# Patient Record
Sex: Male | Born: 1960 | Race: White | Hispanic: No | Smoking: Never smoker
Health system: Southern US, Community
[De-identification: ages and names within clinical notes are randomized; demographics above are authoritative.]

## PROBLEM LIST (undated history)

## (undated) DIAGNOSIS — M5030 Other cervical disc degeneration, unspecified cervical region: Secondary | ICD-10-CM

## (undated) DIAGNOSIS — M51369 Other intervertebral disc degeneration, lumbar region without mention of lumbar back pain or lower extremity pain: Secondary | ICD-10-CM

## (undated) DIAGNOSIS — M545 Low back pain, unspecified: Secondary | ICD-10-CM

## (undated) DIAGNOSIS — F329 Major depressive disorder, single episode, unspecified: Secondary | ICD-10-CM

## (undated) DIAGNOSIS — F32A Depression, unspecified: Secondary | ICD-10-CM

## (undated) DIAGNOSIS — M8580 Other specified disorders of bone density and structure, unspecified site: Secondary | ICD-10-CM

## (undated) DIAGNOSIS — C801 Malignant (primary) neoplasm, unspecified: Secondary | ICD-10-CM

## (undated) DIAGNOSIS — K219 Gastro-esophageal reflux disease without esophagitis: Secondary | ICD-10-CM

## (undated) DIAGNOSIS — M1990 Unspecified osteoarthritis, unspecified site: Secondary | ICD-10-CM

## (undated) DIAGNOSIS — K635 Polyp of colon: Secondary | ICD-10-CM

## (undated) DIAGNOSIS — M5136 Other intervertebral disc degeneration, lumbar region: Secondary | ICD-10-CM

## (undated) HISTORY — DX: Other intervertebral disc degeneration, lumbar region without mention of lumbar back pain or lower extremity pain: M51.369

## (undated) HISTORY — DX: Other specified disorders of bone density and structure, unspecified site: M85.80

## (undated) HISTORY — DX: Other intervertebral disc degeneration, lumbar region: M51.36

## (undated) HISTORY — PX: HIP SURGERY: SHX245

## (undated) HISTORY — DX: Depression, unspecified: F32.A

## (undated) HISTORY — DX: Unspecified osteoarthritis, unspecified site: M19.90

## (undated) HISTORY — DX: Gastro-esophageal reflux disease without esophagitis: K21.9

## (undated) HISTORY — PX: GYNECOMASTIA EXCISION: SHX5170

## (undated) HISTORY — DX: Major depressive disorder, single episode, unspecified: F32.9

## (undated) HISTORY — DX: Malignant (primary) neoplasm, unspecified: C80.1

## (undated) HISTORY — DX: Other cervical disc degeneration, unspecified cervical region: M50.30

## (undated) HISTORY — DX: Polyp of colon: K63.5

## (undated) HISTORY — DX: Low back pain: M54.5

## (undated) HISTORY — PX: CYSTECTOMY: SHX5119

## (undated) HISTORY — DX: Low back pain, unspecified: M54.50

---

## 1998-01-31 ENCOUNTER — Ambulatory Visit (HOSPITAL_BASED_OUTPATIENT_CLINIC_OR_DEPARTMENT_OTHER): Admission: RE | Admit: 1998-01-31 | Discharge: 1998-01-31 | Payer: Self-pay | Admitting: Specialist

## 2004-02-14 ENCOUNTER — Emergency Department (HOSPITAL_COMMUNITY): Admission: EM | Admit: 2004-02-14 | Discharge: 2004-02-14 | Payer: Self-pay | Admitting: Emergency Medicine

## 2004-02-21 ENCOUNTER — Ambulatory Visit: Payer: Self-pay | Admitting: Family Medicine

## 2004-03-02 ENCOUNTER — Ambulatory Visit: Payer: Self-pay | Admitting: Family Medicine

## 2004-03-09 ENCOUNTER — Ambulatory Visit: Payer: Self-pay

## 2004-03-14 ENCOUNTER — Ambulatory Visit: Payer: Self-pay | Admitting: Family Medicine

## 2004-06-06 ENCOUNTER — Ambulatory Visit: Payer: Self-pay | Admitting: Family Medicine

## 2004-06-20 ENCOUNTER — Ambulatory Visit: Payer: Self-pay | Admitting: Family Medicine

## 2004-06-30 ENCOUNTER — Ambulatory Visit: Payer: Self-pay | Admitting: Family Medicine

## 2005-01-05 ENCOUNTER — Ambulatory Visit: Payer: Self-pay | Admitting: Family Medicine

## 2005-05-23 ENCOUNTER — Ambulatory Visit: Payer: Self-pay | Admitting: Family Medicine

## 2005-11-30 ENCOUNTER — Ambulatory Visit: Payer: Self-pay | Admitting: Family Medicine

## 2005-12-11 ENCOUNTER — Ambulatory Visit: Payer: Self-pay | Admitting: Family Medicine

## 2007-02-13 ENCOUNTER — Ambulatory Visit: Payer: Self-pay | Admitting: Family Medicine

## 2007-02-13 DIAGNOSIS — Z860100 Personal history of colon polyps, unspecified: Secondary | ICD-10-CM | POA: Insufficient documentation

## 2007-02-13 DIAGNOSIS — IMO0002 Reserved for concepts with insufficient information to code with codable children: Secondary | ICD-10-CM | POA: Insufficient documentation

## 2007-02-13 DIAGNOSIS — M1990 Unspecified osteoarthritis, unspecified site: Secondary | ICD-10-CM | POA: Insufficient documentation

## 2007-02-13 DIAGNOSIS — F32A Depression, unspecified: Secondary | ICD-10-CM | POA: Insufficient documentation

## 2007-02-13 DIAGNOSIS — F329 Major depressive disorder, single episode, unspecified: Secondary | ICD-10-CM

## 2007-02-13 DIAGNOSIS — K219 Gastro-esophageal reflux disease without esophagitis: Secondary | ICD-10-CM | POA: Insufficient documentation

## 2007-02-24 ENCOUNTER — Telehealth: Payer: Self-pay | Admitting: Family Medicine

## 2007-03-20 ENCOUNTER — Encounter: Payer: Self-pay | Admitting: Family Medicine

## 2007-04-06 HISTORY — PX: LAMINECTOMY: SHX219

## 2007-04-23 ENCOUNTER — Ambulatory Visit (HOSPITAL_COMMUNITY): Admission: RE | Admit: 2007-04-23 | Discharge: 2007-04-23 | Payer: Self-pay | Admitting: Anesthesiology

## 2007-05-06 ENCOUNTER — Encounter: Payer: Self-pay | Admitting: Family Medicine

## 2007-05-07 ENCOUNTER — Encounter: Payer: Self-pay | Admitting: Family Medicine

## 2007-05-07 ENCOUNTER — Encounter: Admission: RE | Admit: 2007-05-07 | Discharge: 2007-05-07 | Payer: Self-pay | Admitting: Neurological Surgery

## 2007-05-09 ENCOUNTER — Ambulatory Visit: Payer: Self-pay | Admitting: Family Medicine

## 2007-05-12 LAB — CONVERTED CEMR LAB
ALT: 36 units/L (ref 0–53)
AST: 27 units/L (ref 0–37)
Albumin: 3.9 g/dL (ref 3.5–5.2)
Alkaline Phosphatase: 92 units/L (ref 39–117)
BUN: 13 mg/dL (ref 6–23)
Basophils Absolute: 0 10*3/uL (ref 0.0–0.1)
Basophils Relative: 0.7 % (ref 0.0–1.0)
Bilirubin, Direct: 0.2 mg/dL (ref 0.0–0.3)
CO2: 28 meq/L (ref 19–32)
Calcium: 9.3 mg/dL (ref 8.4–10.5)
Chloride: 106 meq/L (ref 96–112)
Cholesterol: 227 mg/dL (ref 0–200)
Creatinine, Ser: 0.9 mg/dL (ref 0.4–1.5)
Direct LDL: 175.2 mg/dL
Eosinophils Absolute: 0.1 10*3/uL (ref 0.0–0.6)
Eosinophils Relative: 1.3 % (ref 0.0–5.0)
GFR calc Af Amer: 117 mL/min
GFR calc non Af Amer: 97 mL/min
Glucose, Bld: 97 mg/dL (ref 70–99)
HCT: 46.1 % (ref 39.0–52.0)
HDL: 25.3 mg/dL — ABNORMAL LOW (ref >39.0)
Hemoglobin: 15.5 g/dL (ref 13.0–17.0)
Lymphocytes Relative: 19.1 % (ref 12.0–46.0)
MCHC: 33.6 g/dL (ref 30.0–36.0)
MCV: 88 fL (ref 78.0–100.0)
Monocytes Absolute: 0.6 10*3/uL (ref 0.2–0.7)
Monocytes Relative: 11.8 % — ABNORMAL HIGH (ref 3.0–11.0)
Neutro Abs: 3.7 10*3/uL (ref 1.4–7.7)
Neutrophils Relative %: 67.1 % (ref 43.0–77.0)
Platelets: 278 10*3/uL (ref 150–400)
Potassium: 4.2 meq/L (ref 3.5–5.1)
RBC: 5.24 M/uL (ref 4.22–5.81)
RDW: 13.1 % (ref 11.5–14.6)
Sodium: 141 meq/L (ref 135–145)
TSH: 1.33 microintl units/mL (ref 0.35–5.50)
Testosterone: 217.02 ng/dL — ABNORMAL LOW (ref 350.00–890)
Total Bilirubin: 1.3 mg/dL — ABNORMAL HIGH (ref 0.3–1.2)
Total CHOL/HDL Ratio: 9
Total Protein: 6.7 g/dL (ref 6.0–8.3)
Triglycerides: 121 mg/dL (ref 0–149)
VLDL: 24 mg/dL (ref 0–40)
WBC: 5.4 10*3/uL (ref 4.5–10.5)

## 2007-05-16 ENCOUNTER — Ambulatory Visit: Payer: Self-pay | Admitting: Family Medicine

## 2007-05-16 DIAGNOSIS — E291 Testicular hypofunction: Secondary | ICD-10-CM | POA: Insufficient documentation

## 2007-05-16 DIAGNOSIS — E785 Hyperlipidemia, unspecified: Secondary | ICD-10-CM | POA: Insufficient documentation

## 2007-05-16 DIAGNOSIS — M810 Age-related osteoporosis without current pathological fracture: Secondary | ICD-10-CM | POA: Insufficient documentation

## 2007-05-26 ENCOUNTER — Telehealth: Payer: Self-pay | Admitting: Family Medicine

## 2007-06-03 ENCOUNTER — Telehealth: Payer: Self-pay | Admitting: Family Medicine

## 2007-08-06 ENCOUNTER — Telehealth: Payer: Self-pay | Admitting: Family Medicine

## 2007-08-15 ENCOUNTER — Ambulatory Visit: Payer: Self-pay | Admitting: Family Medicine

## 2007-08-19 LAB — CONVERTED CEMR LAB
Cholesterol: 213 mg/dL (ref 0–200)
Direct LDL: 163.4 mg/dL
HDL: 27.3 mg/dL — ABNORMAL LOW (ref >39.0)
Total CHOL/HDL Ratio: 7.8
Triglycerides: 125 mg/dL (ref 0–149)
VLDL: 25 mg/dL (ref 0–40)

## 2007-10-01 ENCOUNTER — Ambulatory Visit: Payer: Self-pay | Admitting: Family Medicine

## 2007-10-01 LAB — CONVERTED CEMR LAB
Bilirubin Urine: NEGATIVE
Blood in Urine, dipstick: NEGATIVE
Glucose, Urine, Semiquant: NEGATIVE
Ketones, urine, test strip: NEGATIVE
Nitrite: NEGATIVE
Protein, U semiquant: NEGATIVE
Specific Gravity, Urine: 1.02
Urobilinogen, UA: 0.2
WBC Urine, dipstick: NEGATIVE
pH: 5.5

## 2007-10-06 LAB — CONVERTED CEMR LAB
ALT: 46 units/L (ref 0–53)
AST: 30 units/L (ref 0–37)
Albumin: 4.2 g/dL (ref 3.5–5.2)
Alkaline Phosphatase: 99 units/L (ref 39–117)
BUN: 12 mg/dL (ref 6–23)
Basophils Absolute: 0 10*3/uL (ref 0.0–0.1)
Basophils Relative: 0.1 % (ref 0.0–3.0)
Bilirubin, Direct: 0.1 mg/dL (ref 0.0–0.3)
CO2: 30 meq/L (ref 19–32)
Calcium: 9.5 mg/dL (ref 8.4–10.5)
Chloride: 103 meq/L (ref 96–112)
Cholesterol: 213 mg/dL (ref 0–200)
Creatinine, Ser: 1 mg/dL (ref 0.4–1.5)
Direct LDL: 158.8 mg/dL
Eosinophils Absolute: 0.1 10*3/uL (ref 0.0–0.7)
Eosinophils Relative: 1.2 % (ref 0.0–5.0)
GFR calc Af Amer: 104 mL/min
GFR calc non Af Amer: 86 mL/min
Glucose, Bld: 93 mg/dL (ref 70–99)
HCT: 48.7 % (ref 39.0–52.0)
HDL: 27 mg/dL — ABNORMAL LOW (ref >39.0)
Hemoglobin: 16.9 g/dL (ref 13.0–17.0)
Lymphocytes Relative: 19.3 % (ref 12.0–46.0)
MCHC: 34.7 g/dL (ref 30.0–36.0)
MCV: 86.1 fL (ref 78.0–100.0)
Monocytes Absolute: 0.7 10*3/uL (ref 0.1–1.0)
Monocytes Relative: 12 % (ref 3.0–12.0)
Neutro Abs: 3.7 10*3/uL (ref 1.4–7.7)
Neutrophils Relative %: 67.4 % (ref 43.0–77.0)
Platelets: 223 10*3/uL (ref 150–400)
Potassium: 4.3 meq/L (ref 3.5–5.1)
RBC: 5.65 M/uL (ref 4.22–5.81)
RDW: 12.9 % (ref 11.5–14.6)
Sodium: 141 meq/L (ref 135–145)
TSH: 1.34 microintl units/mL (ref 0.35–5.50)
Testosterone: 331.89 ng/dL — ABNORMAL LOW (ref 350.00–890)
Total Bilirubin: 1 mg/dL (ref 0.3–1.2)
Total CHOL/HDL Ratio: 7.9
Total Protein: 7.7 g/dL (ref 6.0–8.3)
Triglycerides: 137 mg/dL (ref 0–149)
VLDL: 27 mg/dL (ref 0–40)
WBC: 5.6 10*3/uL (ref 4.5–10.5)

## 2007-10-07 ENCOUNTER — Ambulatory Visit: Payer: Self-pay | Admitting: Family Medicine

## 2007-10-07 DIAGNOSIS — M949 Disorder of cartilage, unspecified: Secondary | ICD-10-CM

## 2007-10-07 DIAGNOSIS — M899 Disorder of bone, unspecified: Secondary | ICD-10-CM | POA: Insufficient documentation

## 2008-01-21 ENCOUNTER — Ambulatory Visit: Payer: Self-pay | Admitting: Family Medicine

## 2008-01-21 DIAGNOSIS — N644 Mastodynia: Secondary | ICD-10-CM | POA: Insufficient documentation

## 2008-02-02 ENCOUNTER — Telehealth: Payer: Self-pay | Admitting: Family Medicine

## 2008-02-02 ENCOUNTER — Encounter: Payer: Self-pay | Admitting: Family Medicine

## 2008-02-17 ENCOUNTER — Telehealth: Payer: Self-pay | Admitting: Family Medicine

## 2008-02-18 ENCOUNTER — Ambulatory Visit: Payer: Self-pay | Admitting: Family Medicine

## 2008-03-01 ENCOUNTER — Encounter: Admission: RE | Admit: 2008-03-01 | Discharge: 2008-03-01 | Payer: Self-pay | Admitting: Orthopedic Surgery

## 2008-03-05 HISTORY — PX: COLONOSCOPY: SHX174

## 2008-06-08 ENCOUNTER — Encounter: Admission: RE | Admit: 2008-06-08 | Discharge: 2008-06-08 | Payer: Self-pay | Admitting: Orthopedic Surgery

## 2008-06-21 ENCOUNTER — Inpatient Hospital Stay (HOSPITAL_COMMUNITY): Admission: RE | Admit: 2008-06-21 | Discharge: 2008-06-24 | Payer: Self-pay | Admitting: Orthopedic Surgery

## 2008-07-27 ENCOUNTER — Telehealth: Payer: Self-pay | Admitting: Family Medicine

## 2008-07-29 ENCOUNTER — Ambulatory Visit: Payer: Self-pay | Admitting: Family Medicine

## 2008-08-04 ENCOUNTER — Encounter: Admission: RE | Admit: 2008-08-04 | Discharge: 2008-08-04 | Payer: Self-pay | Admitting: Orthopedic Surgery

## 2008-08-05 LAB — CONVERTED CEMR LAB
Cholesterol: 171 mg/dL (ref 0–200)
HDL: 30.1 mg/dL — ABNORMAL LOW (ref >39.00)
LDL Cholesterol: 116 mg/dL — ABNORMAL HIGH (ref 0–99)
Total CHOL/HDL Ratio: 6
Triglycerides: 127 mg/dL (ref 0.0–149.0)
VLDL: 25.4 mg/dL (ref 0.0–40.0)

## 2008-09-22 ENCOUNTER — Encounter (INDEPENDENT_AMBULATORY_CARE_PROVIDER_SITE_OTHER): Payer: Self-pay | Admitting: *Deleted

## 2008-10-15 HISTORY — PX: LUMBAR MICRODISCECTOMY: SHX99

## 2008-11-23 ENCOUNTER — Encounter (INDEPENDENT_AMBULATORY_CARE_PROVIDER_SITE_OTHER): Payer: Self-pay | Admitting: *Deleted

## 2009-01-10 ENCOUNTER — Ambulatory Visit: Payer: Self-pay | Admitting: Family Medicine

## 2009-01-11 DIAGNOSIS — M545 Low back pain, unspecified: Secondary | ICD-10-CM | POA: Insufficient documentation

## 2009-01-12 ENCOUNTER — Ambulatory Visit: Payer: Self-pay | Admitting: Family Medicine

## 2009-01-12 LAB — CONVERTED CEMR LAB
Bilirubin Urine: NEGATIVE
Blood in Urine, dipstick: NEGATIVE
Glucose, Urine, Semiquant: NEGATIVE
Ketones, urine, test strip: NEGATIVE
Nitrite: NEGATIVE
Protein, U semiquant: NEGATIVE
Specific Gravity, Urine: 1.015
Urobilinogen, UA: 0.2
WBC Urine, dipstick: NEGATIVE

## 2009-01-14 LAB — CONVERTED CEMR LAB
ALT: 46 units/L (ref 0–53)
AST: 30 units/L (ref 0–37)
Albumin: 4 g/dL (ref 3.5–5.2)
Alkaline Phosphatase: 102 units/L (ref 39–117)
BUN: 12 mg/dL (ref 6–23)
Basophils Absolute: 0 10*3/uL (ref 0.0–0.1)
Basophils Relative: 0.2 % (ref 0.0–3.0)
Bilirubin, Direct: 0 mg/dL (ref 0.0–0.3)
CO2: 29 meq/L (ref 19–32)
Calcium: 8.9 mg/dL (ref 8.4–10.5)
Chloride: 104 meq/L (ref 96–112)
Cholesterol: 220 mg/dL — ABNORMAL HIGH (ref 0–200)
Creatinine, Ser: 0.9 mg/dL (ref 0.4–1.5)
Direct LDL: 166.6 mg/dL
Eosinophils Absolute: 0.1 10*3/uL (ref 0.0–0.7)
Eosinophils Relative: 2.3 % (ref 0.0–5.0)
GFR calc non Af Amer: 96.24 mL/min (ref >60)
Glucose, Bld: 90 mg/dL (ref 70–99)
HCT: 45.6 % (ref 39.0–52.0)
HDL: 30 mg/dL — ABNORMAL LOW (ref >39.00)
Hemoglobin: 15.7 g/dL (ref 13.0–17.0)
Lymphocytes Relative: 24.5 % (ref 12.0–46.0)
Lymphs Abs: 1.1 10*3/uL (ref 0.7–4.0)
MCHC: 34.4 g/dL (ref 30.0–36.0)
MCV: 88.8 fL (ref 78.0–100.0)
Monocytes Absolute: 0.5 10*3/uL (ref 0.1–1.0)
Monocytes Relative: 11.8 % (ref 3.0–12.0)
Neutro Abs: 2.9 10*3/uL (ref 1.4–7.7)
Neutrophils Relative %: 61.2 % (ref 43.0–77.0)
Platelets: 223 10*3/uL (ref 150.0–400.0)
Potassium: 4.3 meq/L (ref 3.5–5.1)
RBC: 5.13 M/uL (ref 4.22–5.81)
RDW: 13.2 % (ref 11.5–14.6)
Sodium: 141 meq/L (ref 135–145)
TSH: 1.43 microintl units/mL (ref 0.35–5.50)
Total Bilirubin: 0.8 mg/dL (ref 0.3–1.2)
Total CHOL/HDL Ratio: 7
Total Protein: 6.9 g/dL (ref 6.0–8.3)
Triglycerides: 133 mg/dL (ref 0.0–149.0)
VLDL: 26.6 mg/dL (ref 0.0–40.0)
WBC: 4.6 10*3/uL (ref 4.5–10.5)

## 2009-01-17 ENCOUNTER — Ambulatory Visit: Payer: Self-pay | Admitting: Family Medicine

## 2009-01-25 ENCOUNTER — Telehealth: Payer: Self-pay | Admitting: Family Medicine

## 2009-08-17 ENCOUNTER — Ambulatory Visit: Payer: Self-pay | Admitting: Family Medicine

## 2009-08-18 ENCOUNTER — Telehealth: Payer: Self-pay | Admitting: Family Medicine

## 2009-08-18 LAB — CONVERTED CEMR LAB
Cholesterol: 207 mg/dL — ABNORMAL HIGH (ref 0–200)
Direct LDL: 138.8 mg/dL
HDL: 29.2 mg/dL — ABNORMAL LOW (ref >39.00)
Total CHOL/HDL Ratio: 7
Triglycerides: 215 mg/dL — ABNORMAL HIGH (ref 0.0–149.0)
VLDL: 43 mg/dL — ABNORMAL HIGH (ref 0.0–40.0)

## 2009-08-24 ENCOUNTER — Ambulatory Visit: Payer: Self-pay | Admitting: Family Medicine

## 2009-08-24 DIAGNOSIS — K589 Irritable bowel syndrome without diarrhea: Secondary | ICD-10-CM | POA: Insufficient documentation

## 2009-12-27 ENCOUNTER — Encounter: Payer: Self-pay | Admitting: Family Medicine

## 2010-02-01 ENCOUNTER — Encounter: Payer: Self-pay | Admitting: Family Medicine

## 2010-03-27 ENCOUNTER — Encounter: Payer: Self-pay | Admitting: Neurological Surgery

## 2010-04-04 NOTE — Assessment & Plan Note (Signed)
Summary: CONGESTION X 3 WEEKS/CCM   Vital Signs:  Patient Profile:   50 Years Old Male Weight:      254 pounds Temp:     98.5 degrees F oral Pulse rate:   122 / minute BP sitting:   112 / 82  (left arm) Cuff size:   large  Vitals Entered By: Alfred Levins, CMA (February 13, 2007 9:58 AM)                 Chief Complaint:  st and cough.  History of Present Illness: 4 weeks of hard dry cough, ST, and PND. Some HA. No fever. On Mucinex DM and fluids. Saw Urgent Care 2 weeks ago, was given Ceftin, did not help much.  Current Allergies: No known allergies   Past Medical History:    Colonic polyps, hx of    Depression    GERD    Osteoarthritis  Past Surgical History:    colonoscopy    Benign cyst removed from rt hip     Review of Systems      See HPI   Physical Exam  General:     Well-developed,well-nourished,in no acute distress; alert,appropriate and cooperative throughout examination Head:     Normocephalic and atraumatic without obvious abnormalities. No apparent alopecia or balding. Eyes:     No corneal or conjunctival inflammation noted. EOMI. Perrla. Funduscopic exam benign, without hemorrhages, exudates or papilledema. Vision grossly normal. Ears:     External ear exam shows no significant lesions or deformities.  Otoscopic examination reveals clear canals, tympanic membranes are intact bilaterally without bulging, retraction, inflammation or discharge. Hearing is grossly normal bilaterally. Nose:     External nasal examination shows no deformity or inflammation. Nasal mucosa are pink and moist without lesions or exudates. Mouth:     Oral mucosa and oropharynx without lesions or exudates.  Teeth in good repair. Neck:     No deformities, masses, or tenderness noted. Lungs:     Normal respiratory effort, chest expands symmetrically. Lungs are clear to auscultation, no crackles or wheezes.    Impression & Recommendations:  Problem # 1:  OTHER ACUTE  SINUSITIS (ICD-461.8)  His updated medication list for this problem includes:    Augmentin 875-125 Mg Tabs (Amoxicillin-pot clavulanate) .Marland Kitchen..Marland Kitchen Two times a day    Hycodan 5-1.5 Mg/68ml Syrp (Hydrocodone-homatropine) .Marland Kitchen... 1 tsp every 4 hours as needed cough   Complete Medication List: 1)  Aleve 220 Mg Tabs (Naproxen sodium) .... As needed 2)  Augmentin 875-125 Mg Tabs (Amoxicillin-pot clavulanate) .... Two times a day 3)  Hycodan 5-1.5 Mg/58ml Syrp (Hydrocodone-homatropine) .Marland Kitchen.. 1 tsp every 4 hours as needed cough   Patient Instructions: 1)  Please schedule a follow-up appointment as needed.    Prescriptions: HYCODAN 5-1.5 MG/5ML  SYRP (HYDROCODONE-HOMATROPINE) 1 tsp every 4 hours as needed cough  #240 ml x 0   Entered and Authorized by:   Nelwyn Salisbury MD   Signed by:   Nelwyn Salisbury MD on 02/13/2007   Method used:   Print then Give to Patient   RxID:   [id number] AUGMENTIN 875-125 MG  TABS (AMOXICILLIN-POT CLAVULANATE) two times a day  #20 x 0   Entered and Authorized by:   Nelwyn Salisbury MD   Signed by:   Nelwyn Salisbury MD on 02/13/2007   Method used:   Print then Give to Patient   RxID:   [id number]  ]

## 2010-04-04 NOTE — Letter (Signed)
Summary: [facility]  [facility] Provider: This provider was preselected by the workflow.  Signature: The signature status of this document was preset by the workflow  Processed by [employer] Local Indexer Client @ Tuesday, January 18, 2009 9:35:25 AM using version:2010.1.2.11(2.4)   Manually Indexed By: [id number]  idlBatchDetail: [id number]   _____________________________________________________________________  External Attachment:    Type:   Image     Comment:   External Document

## 2010-04-04 NOTE — Assessment & Plan Note (Signed)
Summary: REVIEW LABWORK // RS   Vital Signs:  Patient profile:   50 year old male Weight:      260 pounds BMI:     37.98 BP sitting:   130 / 90  (left arm) Cuff size:   large  Vitals Entered By: Raechel Ache, RN (August 24, 2009 3:36 PM) CC: F/u labs. C/o bloating and burping.   History of Present Illness: Here to discuss his recent lipid panel resuls and also to ask some questions. he has been  closely watching his intake of fatty foods, and this is reflected in the way his LDL has come down. He is exercising as well by running or riding a bike. He asks how he can get his TG down and his HDL up. Also he describes frequent bloating in the abdomen and gasseousness. He has a lot of belching and some flatus. No heartburn. His BMs are mostly normal. he has tried to reduce his intake of starchy foods.   Allergies: No Known Drug Allergies  Past History:  Past Medical History: Reviewed history from 01/10/2009 and no changes required. Colonic polyps, hx of Depression GERD Osteoarthritis normal stress test on 03-09-04 Osteopenia, had DEXA on 05-07-07 Low back pain  Past Surgical History: Reviewed history from 01/10/2009 and no changes required. colonoscopy 2004 per a GI in [place], repeat in 5 yrs Benign cyst removed from rt hip, sees Dr. Lequita Halt Lumbar laminectomy at L5-S1 per Dr. Marikay Alar 2-09 bilateral gynecomastia reductions per Dr. 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 Homero Fellers Aluisio lumbar microdiskectomy at L4-5, on  10-15-08 per Dr. Donette Larry at [facility]  Review of Systems  The patient denies anorexia, fever, weight loss, weight gain, vision loss, decreased hearing, hoarseness, chest pain, syncope, dyspnea on exertion, peripheral edema, prolonged cough, headaches, hemoptysis, abdominal pain, melena, hematochezia, severe indigestion/heartburn, hematuria, incontinence, genital sores, muscle weakness, suspicious skin lesions, transient blindness,  difficulty walking, depression, unusual weight change, abnormal bleeding, enlarged lymph nodes, angioedema, breast masses, and testicular masses.    Physical Exam  General:  Well-developed,well-nourished,in no acute distress; alert,appropriate and cooperative throughout examination Neck:  No deformities, masses, or tenderness noted. Lungs:  Normal respiratory effort, chest expands symmetrically. Lungs are clear to auscultation, no crackles or wheezes. Heart:  Normal rate and regular rhythm. S1 and S2 normal without gallop, murmur, click, rub or other extra sounds. Abdomen:  Bowel sounds positive,abdomen soft and non-tender without masses, organomegaly or hernias noted.   Impression & Recommendations:  Problem # 1:  IRRITABLE BOWEL SYNDROME (ICD-564.1)  Problem # 2:  HYPERLIPIDEMIA (ICD-272.4)  Problem # 3:  GERD (ICD-530.81)  Problem # 4:  COLONIC POLYPS, HX OF (ICD-V12.72)  Complete Medication List: 1)  Diclofenac Sodium 75 Mg Tbec (Diclofenac sodium) .... Two times a day 2)  Gabapentin 300 Mg Caps (Gabapentin) .... Two times a day 3)  Fish Oil 1000 Mg Caps (Omega-3 fatty acids) .... 2 once daily 4)  Align Caps (Probiotic product) .... Once daily  Patient Instructions: 1)  Add Align as a probiotic daily. 2)  Add fish oil capsules daily for the lipids.  3)  Please schedule a follow-up appointment in 6 months .

## 2010-04-04 NOTE — Assessment & Plan Note (Signed)
Summary: cough and congestion/mhf   Vital Signs:  Patient Profile:   50 Years Old Male Height:     69.5 inches Weight:      268 pounds Temp:     98.1 degrees F oral Pulse rate:   78 / minute BP sitting:   122 / 80  (left arm) Cuff size:   large  Vitals Entered By: Alfred Levins, CMA (February 18, 2008 4:20 PM)                 Chief Complaint:  cough x6 wks.  History of Present Illness: For the past 6 weeks he has had dry hacky coughing which has not responded to Biaxin or Augmentin. No sinus pressure or ST or fever.     Current Allergies (reviewed today): No known allergies   Past Medical History:    Reviewed history from 10/07/2007 and no changes required:       Colonic polyps, hx of       Depression       GERD       Osteoarthritis       normal stress test on 03-09-04       Osteopenia, had DEXA on 05-07-07     Review of Systems  The patient denies anorexia, fever, weight loss, weight gain, vision loss, decreased hearing, hoarseness, chest pain, syncope, dyspnea on exertion, peripheral edema, headaches, hemoptysis, abdominal pain, melena, hematochezia, severe indigestion/heartburn, hematuria, incontinence, genital sores, muscle weakness, suspicious skin lesions, transient blindness, difficulty walking, depression, unusual weight change, abnormal bleeding, enlarged lymph nodes, angioedema, breast masses, and testicular masses.     Physical Exam  General:     Well-developed,well-nourished,in no acute distress; alert,appropriate and cooperative throughout examination Head:     Normocephalic and atraumatic without obvious abnormalities. No apparent alopecia or balding. Eyes:     No corneal or conjunctival inflammation noted. EOMI. Perrla. Funduscopic exam benign, without hemorrhages, exudates or papilledema. Vision grossly normal. Ears:     External ear exam shows no significant lesions or deformities.  Otoscopic examination reveals clear canals, tympanic membranes are  intact bilaterally without bulging, retraction, inflammation or discharge. Hearing is grossly normal bilaterally. Nose:     External nasal examination shows no deformity or inflammation. Nasal mucosa are pink and moist without lesions or exudates. Mouth:     Oral mucosa and oropharynx without lesions or exudates.  Teeth in good repair. Neck:     No deformities, masses, or tenderness noted. Lungs:     Normal respiratory effort, chest expands symmetrically. Lungs are clear to auscultation, no crackles or wheezes.    Impression & Recommendations:  Problem # 1:  ACUTE BRONCHITIS (ICD-466.0)  The following medications were removed from the medication list:    Augmentin 875-125 Mg Tabs (Amoxicillin-pot clavulanate) .Marland Kitchen... 1 by mouth two times a day  His updated medication list for this problem includes:    Avelox 400 Mg Tabs (Moxifloxacin hcl) ..... Once daily   Complete Medication List: 1)  Diclofenac Sodium 75 Mg Tbec (Diclofenac sodium) .... Two times a day 2)  Calcium Antacid 500 Mg Chew (Calcium carbonate antacid) .... Two times a day 3)  Avelox 400 Mg Tabs (Moxifloxacin hcl) .... Once daily 4)  Sterapred Ds 12 Day 10 Mg Tabs (Prednisone) .... As directed   Patient Instructions: 1)  Please schedule a follow-up appointment as needed.   Prescriptions: STERAPRED DS 12 DAY 10 MG TABS (PREDNISONE) as directed  #10 x 0   Entered and Authorized by:  Nelwyn Salisbury MD   Signed by:   Nelwyn Salisbury MD on 02/18/2008   Method used:   Electronically to        [facility] (retail)       [address]       [place], [place]  [address]       Ph: [phone]       Fax: [phone]   RxID:   [id number] AVELOX 400 MG TABS (MOXIFLOXACIN HCL) once daily  #10 x 0   Entered and Authorized by:   Nelwyn Salisbury MD   Signed by:   Nelwyn Salisbury MD on 02/18/2008   Method used:   Electronically to        [facility] (retail)       [address]       [place], [place]  [address]       Ph: [phone]        Fax: [phone]   RxID:   [id number]  ]

## 2010-04-04 NOTE — Progress Notes (Signed)
Summary: return call  Phone Note Call from Patient Call back at Home Phone [phone]   Caller: Patient---live call Summary of Call: returning a call Initial call taken by: Warnell Forester,  August 18, 2009 12:17 PM  Follow-up for Phone Call        Phone Call Completed Follow-up by: Raechel Ache, RN,  August 18, 2009 12:30 PM

## 2010-04-04 NOTE — Assessment & Plan Note (Signed)
Summary: DISCUSS LABSA AND BONE DENSITY/MHF   Vital Signs:  Patient Profile:   50 Years Old Male Weight:      251.5 pounds Temp:     98.1 degrees F BP sitting:   130 / 84  Vitals Entered By: Sindy Guadeloupe RN (May 16, 2007 4:20 PM)                 Chief Complaint:  review labs & bone density.  History of Present Illness: Here to discuss recent test results. Had labs here showing a low testosterone of 211 and a high LDL of 175. He has changed his diet a lot with less fried foods, etc. Has lost 7 lbs. in the past 2 weeks.  Had surgery recently with Dr. Yetta Barre, and it was noted that his bones were soft. Had a DEXA, but I haven't seen results yet. He is taking Vit. D and calcium. He is interested in taking testosterone supplements. 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 No doubt this has contributed to this problem.    Current Allergies (reviewed today): No known allergies   Past Medical History:    Reviewed history from 02/13/2007 and no changes required:       Colonic polyps, hx of       Depression       GERD       Osteoarthritis     Review of Systems  The patient denies anorexia, fever, weight loss, weight gain, vision loss, decreased hearing, hoarseness, chest pain, syncope, dyspnea on exhertion, peripheral edema, prolonged cough, hemoptysis, abdominal pain, melena, hematochezia, severe indigestion/heartburn, hematuria, incontinence, genital sores, muscle weakness, suspicious skin lesions, transient blindness, difficulty walking, depression, unusual weight change, abnormal bleeding, enlarged lymph nodes, angioedema, breast masses, and testicular masses.     Physical Exam  General:     Well-developed,well-nourished,in no acute distress; alert,appropriate and cooperative throughout examination    Impression & Recommendations:  Problem # 1:  HYPERLIPIDEMIA  (ICD-272.4)  Problem # 2:  OSTEOPOROSIS (ICD-733.00)  Problem # 3:  TESTOSTERONE DEFICIENCY (ICD-257.2)  Complete Medication List: 1)  Diclofenac Sodium 75 Mg Tbec (Diclofenac sodium) .... Two times a day 2)  Calcium Antacid 500 Mg Chew (Calcium carbonate antacid) .... Two times a day   Patient Instructions: 1)  We discussed a low fat diet. Will repeat labs in 3 months. We discussed osteoporosis.  Taking Vit. D 2000 mg once daily and calcium 2000mg  once daily will help. Will await DEXA results. Based on this we will discuss options for testosterone supplements.    ]

## 2010-04-04 NOTE — Letter (Signed)
Summary: [facility]  [facility]   Imported By: Maryln Gottron 02/02/2010 11:17:46  _____________________________________________________________________  External Attachment:    Type:   Image     Comment:   External Document

## 2010-04-04 NOTE — Progress Notes (Signed)
Summary: avelox rx  Phone Note Call from Patient Call back at Home Phone [phone]   Caller: Patient Call For: Kysha Muralles Summary of Call: pt said you gave him avelox on his last OV, he is feeling better but still has a cough and wondered if you could give him one more round of avelox.  [facility] Initial call taken by: Alfred Levins, CMA,  January 25, 2009 12:23 PM  Follow-up for Phone Call        call in another 10 days of 400 mg once daily  Follow-up by: Nelwyn Salisbury MD,  January 25, 2009 1:11 PM    New/Updated Medications: AVELOX ABC PACK 400 MG TABS (MOXIFLOXACIN HCL) one by mouth q day x 10 days. Prescriptions: AVELOX ABC PACK 400 MG TABS (MOXIFLOXACIN HCL) one by mouth q day x 10 days.  #10 x 0   Entered by:   Alfred Levins, CMA   Authorized by:   Nelwyn Salisbury MD   Signed by:   Alfred Levins, CMA on 01/25/2009   Method used:   Electronically to        [facility] (retail)       [address]       [place], [place]  [address]       Ph: [phone]       Fax: [phone]   RxID:   [id number]  Pt. notified.

## 2010-04-04 NOTE — Progress Notes (Signed)
Summary: Bone Density Result request  Phone Note Call from Patient Call back at [phone]   Caller: Patient triage msg Call For: Clent Ridges Summary of Call: Pt left msg, requesting Bone Density results.  Was asked to check back in 1 week. Initial call taken by: Sid Falcon LPN,  May 26, 2007 11:54 AM  Follow-up for Phone Call        I don't have them back yet Follow-up by: Nelwyn Salisbury MD,  May 26, 2007 4:08 PM  Additional Follow-up for Phone Call Additional follow up Details #1::        Pt. notified. Additional Follow-up by: Lynann Beaver CMA,  May 26, 2007 4:22 PM

## 2010-04-04 NOTE — Assessment & Plan Note (Signed)
Summary: cpx/njr   Vital Signs:  Patient Profile:   50 Years Old Male Height:     69.5 inches Weight:      255 pounds Temp:     98.3 degrees F oral Pulse rate:   84 / minute Pulse rhythm:   regular BP sitting:   130 / 84  (left arm) Cuff size:   large  Vitals Entered By: Alfred Levins, CMA (October 07, 2007 1:49 PM)                 Chief Complaint:  cpx.  History of Present Illness: 50 yr old male for cpx. He continues to have pain in the left hip and some mild low back pain. trying to exercise and watch his diet. He is reluctant to take any meds for his cholesterol unless he absolutely has to.     Current Allergies (reviewed today): No known allergies   Past Medical History:    Reviewed history from 02/13/2007 and no changes required:       Colonic polyps, hx of       Depression       GERD       Osteoarthritis       normal stress test on 03-09-04       Osteopenia, had DEXA on 05-07-07  Past Surgical History:    Reviewed history from 02/13/2007 and no changes required:       colonoscopy 2004 per a GI in [place], repeat in 5 yrs       Benign cyst removed from rt hip, sees Dr. Lequita Halt       Lumbar laminectomy at L5-S1 per Dr. Marikay Alar 2-09          Family History:    Reviewed history and no changes required:       Family History of Arthritis       Family History Hypertension  Social History:    Reviewed history and no changes required:       Married       Never Smoked       Alcohol use-yes       Drug use-no   Risk Factors:  Tobacco use:  never Drug use:  no Alcohol use:  yes   Review of Systems  The patient denies anorexia, fever, weight loss, weight gain, vision loss, decreased hearing, hoarseness, chest pain, syncope, dyspnea on exertion, peripheral edema, prolonged cough, headaches, hemoptysis, abdominal pain, melena, hematochezia, severe indigestion/heartburn, hematuria, incontinence, genital sores, muscle weakness, suspicious skin lesions,  transient blindness, difficulty walking, depression, unusual weight change, abnormal bleeding, enlarged lymph nodes, angioedema, breast masses, and testicular masses.     Physical Exam  General:     overweight-appearing.   Head:     Normocephalic and atraumatic without obvious abnormalities. No apparent alopecia or balding. Eyes:     No corneal or conjunctival inflammation noted. EOMI. Perrla. Funduscopic exam benign, without hemorrhages, exudates or papilledema. Vision grossly normal. Ears:     External ear exam shows no significant lesions or deformities.  Otoscopic examination reveals clear canals, tympanic membranes are intact bilaterally without bulging, retraction, inflammation or discharge. Hearing is grossly normal bilaterally. Nose:     External nasal examination shows no deformity or inflammation. Nasal mucosa are pink and moist without lesions or exudates. Mouth:     Oral mucosa and oropharynx without lesions or exudates.  Teeth in good repair. Neck:     No deformities, masses, or tenderness noted. Chest Wall:  No deformities, masses, tenderness or gynecomastia noted. Lungs:     Normal respiratory effort, chest expands symmetrically. Lungs are clear to auscultation, no crackles or wheezes. Heart:     Normal rate and regular rhythm. S1 and S2 normal without gallop, murmur, click, rub or other extra sounds. Abdomen:     Bowel sounds positive,abdomen soft and non-tender without masses, organomegaly or hernias noted. Genitalia:     Testes bilaterally descended without nodularity, tenderness or masses. No scrotal masses or lesions. No penis lesions or urethral discharge. Msk:     No deformity or scoliosis noted of thoracic or lumbar spine.   Pulses:     R and L carotid,radial,femoral,dorsalis pedis and posterior tibial pulses are full and equal bilaterally Extremities:     No clubbing, cyanosis, edema, or deformity noted with normal full range of motion of all joints.     Neurologic:     No cranial nerve deficits noted. Station and gait are normal. Plantar reflexes are down-going bilaterally. DTRs are symmetrical throughout. Sensory, motor and coordinative functions appear intact. Skin:     Intact without suspicious lesions or rashes Cervical Nodes:     No lymphadenopathy noted Axillary Nodes:     No palpable lymphadenopathy Inguinal Nodes:     No significant adenopathy Psych:     Cognition and judgment appear intact. Alert and cooperative with normal attention span and concentration. No apparent delusions, illusions, hallucinations    Impression & Recommendations:  Problem # 1:  PHYSICAL EXAMINATION (ICD-V70.0)  Complete Medication List: 1)  Diclofenac Sodium 75 Mg Tbec (Diclofenac sodium) .... Two times a day 2)  Calcium Antacid 500 Mg Chew (Calcium carbonate antacid) .... Two times a day   Patient Instructions: 1)  Continue exercise and strict diet. Recheck lipids in 6 months. Repeat DEXA next spring. He will find the name of the GI he saw for his colonoscopy so we can refer him for another one.   Prescriptions: DICLOFENAC SODIUM 75 MG  TBEC (DICLOFENAC SODIUM) two times a day  #60 x 11   Entered and Authorized by:   Nelwyn Salisbury MD   Signed by:   Nelwyn Salisbury MD on 10/07/2007   Method used:   [facility]ron[facility] to ...       [facility]       [address]       [place], [place]  [address]       Ph: [phone]       Fax: [phone]   RxID:   [id number]  ]

## 2010-04-04 NOTE — Progress Notes (Signed)
Summary: results of bone density   Phone Note Call from Patient Call back at [phone]   Caller: patient vm triage Call For: Clent Ridges  Summary of Call: Would like to know what T score means.  results of bone density test Initial call taken by: Roselle Locus,  June 03, 2007 4:54 PM  Follow-up for Phone Call        this is difficult to explain over the phone. Make ov to go over it  LMTCB ..................................................................Marland KitchenAlfred Levins, CMA  June 04, 2007 8:23 AM LMTCB ..................................................................Marland KitchenAlfred Levins, CMA  June 06, 2007 7:57 AM Follow-up by: Nelwyn Salisbury MD,  June 03, 2007 5:53 PM  Additional Follow-up for Phone Call Additional follow up Details #1::        pt aware Additional Follow-up by: Alfred Levins, CMA,  June 06, 2007 8:03 AM

## 2010-04-06 NOTE — Letter (Signed)
Summary: [facility]  [facility]   Imported By: Maryln Gottron 03/15/2010 13:46:49  _____________________________________________________________________  External Attachment:    Type:   Image     Comment:   External Document

## 2010-06-14 LAB — TYPE AND SCREEN
ABO/RH(D): A POS
Antibody Screen: NEGATIVE

## 2010-06-14 LAB — BASIC METABOLIC PANEL
BUN: 10 mg/dL (ref 6–23)
BUN: 5 mg/dL — ABNORMAL LOW (ref 6–23)
BUN: 9 mg/dL (ref 6–23)
CO2: 28 mEq/L (ref 19–32)
CO2: 30 mEq/L (ref 19–32)
CO2: 31 mEq/L (ref 19–32)
Calcium: 8.2 mg/dL — ABNORMAL LOW (ref 8.4–10.5)
Calcium: 8.2 mg/dL — ABNORMAL LOW (ref 8.4–10.5)
Calcium: 8.4 mg/dL (ref 8.4–10.5)
Chloride: 101 mEq/L (ref 96–112)
Chloride: 102 mEq/L (ref 96–112)
Chloride: 96 mEq/L (ref 96–112)
Creatinine, Ser: 0.86 mg/dL (ref 0.4–1.5)
Creatinine, Ser: 0.94 mg/dL (ref 0.4–1.5)
Creatinine, Ser: 1 mg/dL (ref 0.4–1.5)
GFR calc Af Amer: >60 mL/min (ref >60)
GFR calc Af Amer: >60 mL/min (ref >60)
GFR calc Af Amer: >60 mL/min (ref >60)
GFR calc non Af Amer: >60 mL/min (ref >60)
GFR calc non Af Amer: >60 mL/min (ref >60)
GFR calc non Af Amer: >60 mL/min (ref >60)
Glucose, Bld: 120 mg/dL — ABNORMAL HIGH (ref 70–99)
Glucose, Bld: 132 mg/dL — ABNORMAL HIGH (ref 70–99)
Glucose, Bld: 133 mg/dL — ABNORMAL HIGH (ref 70–99)
Potassium: 3.6 mEq/L (ref 3.5–5.1)
Potassium: 3.7 mEq/L (ref 3.5–5.1)
Potassium: 4.2 mEq/L (ref 3.5–5.1)
Sodium: 134 mEq/L — ABNORMAL LOW (ref 135–145)
Sodium: 137 mEq/L (ref 135–145)
Sodium: 137 mEq/L (ref 135–145)

## 2010-06-14 LAB — COMPREHENSIVE METABOLIC PANEL
ALT: 56 U/L — ABNORMAL HIGH (ref 0–53)
AST: 39 U/L — ABNORMAL HIGH (ref 0–37)
Albumin: 4 g/dL (ref 3.5–5.2)
Alkaline Phosphatase: 80 U/L (ref 39–117)
BUN: 12 mg/dL (ref 6–23)
CO2: 28 mEq/L (ref 19–32)
Calcium: 9.3 mg/dL (ref 8.4–10.5)
Chloride: 107 mEq/L (ref 96–112)
Creatinine, Ser: 0.93 mg/dL (ref 0.4–1.5)
GFR calc Af Amer: >60 mL/min (ref >60)
GFR calc non Af Amer: >60 mL/min (ref >60)
Glucose, Bld: 101 mg/dL — ABNORMAL HIGH (ref 70–99)
Potassium: 4.6 mEq/L (ref 3.5–5.1)
Sodium: 142 mEq/L (ref 135–145)
Total Bilirubin: 0.9 mg/dL (ref 0.3–1.2)
Total Protein: 7 g/dL (ref 6.0–8.3)

## 2010-06-14 LAB — URINALYSIS, ROUTINE W REFLEX MICROSCOPIC
Bilirubin Urine: NEGATIVE
Glucose, UA: NEGATIVE mg/dL
Hgb urine dipstick: NEGATIVE
Ketones, ur: NEGATIVE mg/dL
Nitrite: NEGATIVE
Protein, ur: NEGATIVE mg/dL
Specific Gravity, Urine: 1.024 (ref 1.005–1.030)
Urobilinogen, UA: 0.2 mg/dL (ref 0.0–1.0)
pH: 6 (ref 5.0–8.0)

## 2010-06-14 LAB — APTT: aPTT: 23 seconds — ABNORMAL LOW (ref 24–37)

## 2010-06-14 LAB — CBC
HCT: 35.5 % — ABNORMAL LOW (ref 39.0–52.0)
HCT: 36.5 % — ABNORMAL LOW (ref 39.0–52.0)
HCT: 39.2 % (ref 39.0–52.0)
HCT: 46.7 % (ref 39.0–52.0)
Hemoglobin: 12.4 g/dL — ABNORMAL LOW (ref 13.0–17.0)
Hemoglobin: 12.7 g/dL — ABNORMAL LOW (ref 13.0–17.0)
Hemoglobin: 13.8 g/dL (ref 13.0–17.0)
Hemoglobin: 16.3 g/dL (ref 13.0–17.0)
MCHC: 34.8 g/dL (ref 30.0–36.0)
MCHC: 35.1 g/dL (ref 30.0–36.0)
MCHC: 35.1 g/dL (ref 30.0–36.0)
MCHC: 34.9 g/dL (ref 30.0–36.0)
MCV: 88.5 fL (ref 78.0–100.0)
MCV: 88.7 fL (ref 78.0–100.0)
MCV: 89 fL (ref 78.0–100.0)
MCV: 89.6 fL (ref 78.0–100.0)
Platelets: 187 10*3/uL (ref 150–400)
Platelets: 198 10*3/uL (ref 150–400)
Platelets: 221 10*3/uL (ref 150–400)
Platelets: 233 10*3/uL (ref 150–400)
RBC: 4 MIL/uL — ABNORMAL LOW (ref 4.22–5.81)
RBC: 4.12 MIL/uL — ABNORMAL LOW (ref 4.22–5.81)
RBC: 4.41 MIL/uL (ref 4.22–5.81)
RBC: 5.21 MIL/uL (ref 4.22–5.81)
RDW: 13 % (ref 11.5–15.5)
RDW: 13.4 % (ref 11.5–15.5)
RDW: 13.5 % (ref 11.5–15.5)
RDW: 13.1 % (ref 11.5–15.5)
WBC: 10.6 10*3/uL — ABNORMAL HIGH (ref 4.0–10.5)
WBC: 10.6 10*3/uL — ABNORMAL HIGH (ref 4.0–10.5)
WBC: 11 10*3/uL — ABNORMAL HIGH (ref 4.0–10.5)
WBC: 6.2 10*3/uL (ref 4.0–10.5)

## 2010-06-14 LAB — PROTIME-INR
INR: 1.1 (ref 0.00–1.49)
INR: 1.7 — ABNORMAL HIGH (ref 0.00–1.49)
INR: 2.4 — ABNORMAL HIGH (ref 0.00–1.49)
INR: 1 (ref 0.00–1.49)
Prothrombin Time: 14.7 seconds (ref 11.6–15.2)
Prothrombin Time: 20.3 seconds — ABNORMAL HIGH (ref 11.6–15.2)
Prothrombin Time: 28.1 seconds — ABNORMAL HIGH (ref 11.6–15.2)
Prothrombin Time: 13.7 seconds (ref 11.6–15.2)

## 2010-06-14 LAB — ABO/RH: ABO/RH(D): A POS

## 2010-07-18 NOTE — Op Note (Signed)
[name], [name]               ACCOUNT NO.:  [id number]   MEDICAL RECORD NO.:  [id number]          PATIENT TYPE:  INP   LOCATION:  0004                         FACILITY:  [facility]   PHYSICIAN:  Ollen Gross, M.D.    DATE OF BIRTH:  [birth year]   DATE OF PROCEDURE:  DATE OF DISCHARGE:                               OPERATIVE REPORT   PREOPERATIVE DIAGNOSIS:  Osteoarthritis left hip.   POSTOPERATIVE DIAGNOSIS:  Osteoarthritis left hip.   PROCEDURE:  Left total hip arthroplasty.   SURGEON:  Ollen Gross, M.D.   ASSISTANT:  Alexzandrew L. Perkins, P.A.C.   ANESTHESIA:  Spinal.   ESTIMATED BLOOD LOSS:  750.   DRAINS:  Hemovac x1.   COMPLICATIONS:  None.   CONDITION:  Stable to recovery room.   CLINICAL NOTE:  [name] is a 50 year old male with severe end-stage  arthritis in both hips, left more symptomatic than the right.  He has  failed nonoperative management and presents now for left total hip  arthroplasty.   PROCEDURE IN DETAIL:  After successful administration of spinal  anesthetic, the patient is placed in a right lateral decubitus position  with the left side up and held with the hip positioner.  Left lower  extremity is isolated from his perineum with plastic drapes and prepped  and draped in the usual sterile fashion.  A short posterolateral  incision is made with a 10-blade through subcutaneous tissue to the  level of fascia lata which is incised in line with the skin incision.  The sciatic nerve was palpated and protected and his short external  rotators isolated off the femur.  Capsulectomy is performed and the hip  is dislocated.  The center of the femoral head is marked and a trial  prosthesis placed such that the center of the trial head corresponds to  the center of his native femoral head.  Osteotomy line is marked on the  femoral neck and osteotomy made with an oscillating saw.  The femoral  head is removed and the femur is retracted anteriorly to gain  acetabular  exposure.   Acetabular retractors are placed and the labrum and osteophytes removed.  There is a massive rim of osteophytes around the acetabulum.  Acetabular  reaming begins at 47 mm, coursing increments of 20 to 55 mm and a 56-mm  Pinnacle acetabular shell is placed in anatomic position ____40 -mm  neutral Ultamet metal liner is then placed for the metal-on-metal hip  replacement.   The femur is then prepared with the canal finder and irrigation.  Axial  reaming is performed at 13.5 mm proximal reaming to a 28F and the sleeve  machined to a large.  An 28F large trial sleeve is placed and 18 x 13  stem, and a 36 + 12 neck about 5 degrees beyond native anteversion.  A  40 + 0 head is placed.  This reduces easily so went to 40 +3 which had  more appropriate soft tissue tension.  There is excellent stability with  full extension, full external rotation, 70 degrees flexion, 40 degrees  adduction and 70 degrees  internal rotation, and 90 degrees of flexion at  70 degrees of internal rotation.  By placing the left leg on top of the  right, it feels as though the leg lengths were equal.  The hip is then  dislocated and trials are removed.  The permanent 70F large sleeve is  placed in the 18 x 13 stem and a 36 + 12 neck, matching native  anteversion.  The 40 + 3 head is placed and the hip is reduced with the  same stability parameters.  The wound is copiously irrigated with saline  solution and then the short rotators are reattached to the femur through  drill holes.  The fascia lata is closed over Hemovac drain with  interrupted #1 Vicryl subcu closure,  #1 then #2-0 Vicryl and  subcuticular running 4-0 Monocryl.  The incision is then cleaned and  dried and Steri-Strips and a bulky sterile dressing applied.  A drain  suction and he is placed into a knee immobilizer, awakened and  transported to recovery in stable condition.      Ollen Gross, M.D.  Electronically  Signed     FA/MEDQ  D:  06/21/2008  T:  06/21/2008  Job:  [id number]

## 2010-07-18 NOTE — Op Note (Signed)
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   PROCEDURE:  1. Depressive lumbar hemilaminectomy, medial facetectomy, and      foraminotomy L4-L5 on the left side followed by microdiscectomy at      L4-L5 on the left utilizing microscopic dissection.  2. Decompressive lumbar hemilaminectomy, medial facetectomy with      foraminotomy at L5-S1 on the left utilizing microscopic dissection.   SURGEON:  Tia Alert, M.D.   ASSISTANT:  Donalee Citrin, M.D.   ANESTHESIA:  General endotracheal anesthesia.   COMPLICATIONS:  None apparent.   INDICATIONS FOR PROCEDURE:  [name] is a 50 year old gentleman who  presented with back and left leg pain that seemed to follow an L5  distribution.  He had numbness in his left leg.  He tried medical  management for several months without significant relief.  He had an MRI  which showed a large disc herniation with a free fragment at L4-L5 on  the left side and spondylosis with lateral recess stenosis at L5-S1 on  the left side.  I recommended a two level decompression followed by  microdiscectomy at L4-L5 with possible microdiscectomy at L5-S1.  He  understood the risks, benefits, and expected outcome and wished to  proceed.   DESCRIPTION OF PROCEDURE:  The patient was taken to the operating room  and after induction of adequate generalized endotracheal anesthesia, he  was rolled in the  prone position on the Wilson frame.  All pressure  points were padded.  His lumbar region was prepped with DuraPrep and  draped in the usual sterile fashion.  5 mL local anesthesia was injected  and a small dorsal midline incision was made and carried down to the  lumbosacral fascia.  The fascia was opened and the paraspinous  musculature was taken down in a subperiosteal fashion to expose L4-L5  and L5-S1 on the left side.  Intraoperative x-ray confirmed my level and  then I used a combination of the high speed drill and Kerrison punches  to perform a hemilaminectomy, medial facetectomy, and foraminotomy at L4-  L5 and L5-S1 on the left side.  The underlying yellow ligament was  opened and then removed in a piecemeal fashion to expose the underlying  dura, L5 and S1 nerve roots.  I dissected out to the medial pedicle wall  at L4-L5.  I widened the foraminotomy until I could palpate the medial,  caudal and cranial surfaces of the pedicle following the L5  nerve root  out into the foramen. L5-S1 was decompressed in the same fashion widely  decompressing the left S1 nerve root.  I retracted the left S1 nerve  root medially, coagulated the epidural venous vasculature, and found a  large spondylitic ridge but not a significant disc herniation and,  therefore, we felt we did not need to perform a microdiscectomy at this  level.  The nerve root was free and pulsatile. At L4-L5, we did find a  large subannular disc herniation with a large free fragment inferiorly  at the pedicle level.  These were removed with a combination of the  nerve hook and pituitary rongeurs after the annulus was incised.  A  thorough intradiscal discectomy was performed with pituitary rongeurs  and curettes.  Once the discectomy was completed, we palpated into the  midline and into the foramen with a coronary dilator to assure adequate  decompression of the L5 nerve root.  We then went back and inspected L5-  S1.  We felt  like we had a good decompression of the S1 nerve root.  We  then irrigated with saline solution containing bacitracin, dried all  bleeding points with bipolar cautery, and once meticulous hemostasis was  achieved, lined the dura with Duragen to help prevent epidural scarring  and then closed the fascia with 0 Vicryl, closed the subcutaneous and  subcuticular tissue with 2-0 and 3-0 Vicryl, and closed the skin with  Benzoin and Steri-Strips.  The drapes removed.  A sterile dressing was  applied.  The patient was awakened from general anesthesia and  transferred to the recovery room in stable condition.  At the end of the  procedure, all sponge, needle, and instrument counts were correct.      Tia Alert, MD  Electronically Signed     DSJ/MEDQ  D:  04/23/2007  T:  04/23/2007  Job:  [id number]

## 2010-07-18 NOTE — H&P (Signed)
NAMECHRISTHOPER, BUSBEE               ACCOUNT NO.:  [id number]   MEDICAL RECORD NO.:  [id number]          PATIENT TYPE:  INP   LOCATION:  NA                           FACILITY:  [facility]   PHYSICIAN:  Ollen Gross, M.D.    DATE OF BIRTH:  12-Aug-1960   DATE OF ADMISSION:  06/21/2008  DATE OF DISCHARGE:                              HISTORY & PHYSICAL   PREADMISSION HISTORY AND PHYSICAL   CHIEF COMPLAINT:  Left hip pain.   HISTORY OF PRESENT ILLNESS:  The patient is a 50 year old male, who has  been seen by Dr. Lequita Halt for ongoing hip problems that have been  bothering him for quite some time now.  He has been seen over at [facility] and had an MRI.  He has had documented arthritic changes, but  no evidence of AVN.  He has progressively gotten worse over the past  several years.  He has been followed up recently, and his last set of  radiographs this past Fall showed an end-stage arthritis with bone-on-  bone.  The left is worse than the right.  Both hips are arthritic.  The  left is causing a significant amount pain interfering with his daily  activities.  He is at a point where he could benefit from undergoing  procedure.  He would to like proceed.  Risks and benefits have been  discussed and subsequently admitted to the hospital.   ALLERGIES:  No known drug allergies.   CURRENT MEDICATIONS:  Diclofenac, flaxseed oil, Omega vitamins, calcium  plus D, multivitamin, B complex.   PAST MEDICAL HISTORY:  1. A mildly elevated cholesterol.  2. Hemorrhoids.  3. Degenerative disk disease.   PAST SURGICAL HISTORY:  1. L5-S1 microdiskectomy in February of 2009 with a preexisting      numbness in the lateral left calf and foot.  2. Right hip bursectomy.  3. Colonoscopy with polypectomy.   FAMILY HISTORY:  Father deceased.  Mother living age 45 with a history  of hypertension and glaucoma.   SOCIAL HISTORY:  He is married, he is a Optician, dispensing, a nonsmoker, 2  drinks a month, no  children, his spouse will be assisting with care  after surgery.   REVIEW OF SYSTEMS:  GENERAL:  No fevers, chills, or night sweats.  NEURO:  No seizures, syncope, or paralysis.  RESPIRATORY:  No shortness  breath, productive cough, or hemoptysis.  CARDIOVASCULAR:  No chest  pain, angina, or orthopnea.  GI:  No nausea, vomiting, diarrhea, or  constipation.  GU:  No dysuria or hematuria.  MUSCULOSKELETAL:  Hip  pain.   PHYSICAL EXAMINATION:  VITAL SIGNS:  Pulse 72, respirations 14, blood  pressure 141/82.  GENERAL:  A 50 year old white male, well-nourished and well-developed, a  large muscular frame.  He is alert, oriented, and cooperative.  HEENT: Normocephalic, atraumatic.  Pupils are round and reactive.  NECK:  Supple.  CHEST:  Clear anterior posterior chest walls, no rhonchi, rales, or  wheezing.  HEART:  Regular rate and rhythm, no murmur, S1 S2 noted.  ABDOMEN:  Soft and nontender, bowel sounds present.  RECTAL/BREASTS/GENITALIA:  Not done and not pertinent to present  illness.  EXTREMITIES:  The right hip shows flexion 90, minimal internal rotation,  minimal external rotation, 30 degrees of abduction.  The left hip shows  flexion 90, zero internal rotation, zero external rotation, with only  about 20 degrees of abduction.  He does have numbness on the dorsum of  the left foot extending along the left calf.  This is preexisting from  his previous back surgery.   IMPRESSION:  Osteoarthritis, the left hip greater than right hip.   PLAN:  The patient admitted to [facility] to undergo a left  total hip replacement arthroplasty.  Surgery will be performed by Ollen Gross.      Alexzandrew L. Perkins, P.A.C.      Ollen Gross, M.D.  Electronically Signed    ALP/MEDQ  D:  06/20/2008  T:  06/20/2008  Job:  [id number]   cc:   Tia Alert, MD  Fax: [phone]   Tera Mater. Clent Ridges, MD  [address] [place]  [place] [address]   Ashok Croon

## 2010-07-21 NOTE — Discharge Summary (Signed)
[name], [name]               ACCOUNT NO.:  [id number]   MEDICAL RECORD NO.:  [id number]          PATIENT TYPE:  INP   LOCATION:  1603                         FACILITY:  [facility]   PHYSICIAN:  Ollen Gross, M.D.    DATE OF BIRTH:  April 03, 1960   DATE OF ADMISSION:  06/21/2008  DATE OF DISCHARGE:  06/24/2008                               DISCHARGE SUMMARY   ADMISSION DIAGNOSES:  1. Osteoarthritis left hip, greater than right hip.  2. Mildly elevated cholesterol.  3. Hemorrhoids.  4. Degenerative disk disease.   DISCHARGE DIAGNOSES:  1. Osteoarthritis left hip status post left total hip replacement and      arthroplasty.  2. Osteoarthritis right hip.  3. Mildly elevated cholesterol.  4. Hemorrhoids.  5. Degenerative disk disease.   PROCEDURE:  On June 21, 2008, left total hip.   SURGEON:  Ollen Gross, M.D.   ASSISTANT:  Alexzandrew L. Perkins, P.A.C.   ANESTHESIA:  Spinal anesthesia.   CONSULTATIONS:  None.   BRIEF HISTORY:  [name] is a 50 year old male with severe end-stage  arthritis of both hips, left more symptomatic than right, failed  operative management and now presents for total hip arthroplasty.   LABORATORY DATA:  Preop CBC:  Hemoglobin 16.3, hematocrit 46.7, white  cell count 6.2, platelets 233, postop hemoglobin 13.8, drifted down to  12.7, last H and H 12.4 and 35.5.  PT/PTT preop 13.7 and 23  respectively.  INR 1.0.  Serial pro-times followed per Coumadin  protocol.  Last PT/INR 28.1 and 2.4.  Chem panel on admission all within  normal limits with the exception of minimally elevated AST of 39 and  minimally elevated ALT of 56.  Serial BMETs were followed.  Electrolytes  remained within normal limits with the exception that sodium dropped  from 137-134.  Preop UA negative.  Blood group type A+.   DIAGNOSTICS:  1. EKG June 14, 2008:  Normal sinus rhythm, minimal voltage criteria      for LVH.  Since tracing, no significant change confirmed by Dr.    Donia Guiles.  2. Left hip film June 14, 2008:  Advanced osteoarthritis, left      greater than right.  3. Portable pelvis and hip film:  Well seated components of left total      hip.   HOSPITAL COURSE:  The patient was admitted to [facility],  taken to the OR and underwent above stated procedure without  complication.  The patient tolerated the procedure well, later  transferred to the recovery room on the orthopedic floor.  Started on  PCA and p.o. analgesics, given 24 hours postop IV antibiotics.  Started  on Coumadin for DVT prophylaxis.  Doing pretty well on the morning of  day 1.  A little bit of groin pain.  Started getting up out of bed and  had excellent urinary output.  Hemovac drain placed at the time of  surgery was removed.  Hemoglobin looked stabilized and looked good.  Did  very well on day 1.  By day 2, was already up walking 80-160 feet.  Dressing changed  on day 2 ,incision looked good.  He had already been  weaned over to p.o. meds.  Incision was healing well.  By day 3, the  patient was doing well, tolerating his meds and discharged home.   DISPOSITION:  The patient was discharged home on June 24, 2008.   DISCHARGE MEDICATIONS:  1. Percocet.  2. Robaxin.  3. Coumadin.   FOLLOW UP:  Follow up in 2 weeks.   ACTIVITY:  Partial weightbearing 25% left leg, hip precautions, total  hip protocol.  Home health PT and home health nursing.   DIET:  As tolerated.   CONDITION ON DISCHARGE:  Improving.      Alexzandrew L. Perkins, P.A.C.      Ollen Gross, M.D.  Electronically Signed    ALP/MEDQ  D:  07/01/2008  T:  07/01/2008  Job:  [id number]   cc:   Ollen Gross, M.D.  Fax: [phone]   Tia Alert, MD  Fax: [phone]   Tera Mater. Clent Ridges, MD  [address] [place]  [place] [address]   Ashok Croon

## 2010-07-26 ENCOUNTER — Telehealth: Payer: Self-pay | Admitting: Family Medicine

## 2010-08-01 ENCOUNTER — Other Ambulatory Visit: Payer: Self-pay | Admitting: Family Medicine

## 2010-08-16 ENCOUNTER — Other Ambulatory Visit: Payer: Self-pay | Admitting: Family Medicine

## 2010-08-29 ENCOUNTER — Telehealth: Payer: Self-pay | Admitting: Family Medicine

## 2010-08-29 NOTE — Telephone Encounter (Signed)
I have not seen him in over a year. He needs to make an OV soon, and have him come in fasting so we can get labs that day

## 2010-08-29 NOTE — Telephone Encounter (Signed)
Pt is requesting order to have fasting lipids and testosterone levels checked. Ok to order?

## 2010-08-30 NOTE — Telephone Encounter (Signed)
Pt will come in fasting on 09-07-2010.

## 2010-09-05 ENCOUNTER — Ambulatory Visit (INDEPENDENT_AMBULATORY_CARE_PROVIDER_SITE_OTHER): Payer: BC Managed Care – PPO | Admitting: Family Medicine

## 2010-09-05 ENCOUNTER — Encounter: Payer: Self-pay | Admitting: Family Medicine

## 2010-09-05 VITALS — BP 110/76 | HR 72 | Temp 98.1°F | Wt 254.0 lb

## 2010-09-05 DIAGNOSIS — Z309 Encounter for contraceptive management, unspecified: Secondary | ICD-10-CM

## 2010-09-05 DIAGNOSIS — E291 Testicular hypofunction: Secondary | ICD-10-CM

## 2010-09-05 DIAGNOSIS — E785 Hyperlipidemia, unspecified: Secondary | ICD-10-CM

## 2010-09-05 LAB — POCT URINALYSIS DIPSTICK
Bilirubin, UA: NEGATIVE
Blood, UA: NEGATIVE
Glucose, UA: NEGATIVE
Ketones, UA: NEGATIVE
Leukocytes, UA: NEGATIVE
Nitrite, UA: NEGATIVE
Protein, UA: NEGATIVE
Spec Grav, UA: 1.015
Urobilinogen, UA: 0.2
pH, UA: 6

## 2010-09-05 NOTE — Progress Notes (Signed)
  Subjective:    Patient ID: [name], male    DOB: Jul 02, 1960, 50 y.o.   MRN: [id number]  HPI Here fasting for labs to follow up on hyperlipidemia and on low testosterone. He has been off all meds for at least 6 months and he has slipped as far as diet goes. He feels fine in general. He would like a referral to get a vasectomy.    Review of Systems  Constitutional: Negative.   Respiratory: Negative.   Cardiovascular: Negative.        Objective:   Physical Exam  Constitutional: He appears well-developed and well-nourished.  Cardiovascular: Normal rate, regular rhythm, normal heart sounds and intact distal pulses.   Pulmonary/Chest: Effort normal and breath sounds normal.          Assessment & Plan:  Get labs today. I stressed the importance of a low fat diet. Refer to Urology

## 2010-09-06 LAB — HEPATIC FUNCTION PANEL
ALT: 41 U/L (ref 0–53)
AST: 35 U/L (ref 0–37)
Albumin: 4.6 g/dL (ref 3.5–5.2)
Alkaline Phosphatase: 85 U/L (ref 39–117)
Bilirubin, Direct: 0.1 mg/dL (ref 0.0–0.3)
Indirect Bilirubin: 0.7 mg/dL (ref 0.0–0.9)
Total Bilirubin: 0.8 mg/dL (ref 0.3–1.2)
Total Protein: 7.3 g/dL (ref 6.0–8.3)

## 2010-09-06 LAB — CBC WITH DIFFERENTIAL/PLATELET
Basophils Absolute: 0 10*3/uL (ref 0.0–0.1)
Basophils Relative: 0 % (ref 0–1)
Eosinophils Absolute: 0.1 10*3/uL (ref 0.0–0.7)
Eosinophils Relative: 1 % (ref 0–5)
HCT: 46.8 % (ref 39.0–52.0)
Hemoglobin: 15.9 g/dL (ref 13.0–17.0)
Lymphocytes Relative: 20 % (ref 12–46)
Lymphs Abs: 1 10*3/uL (ref 0.7–4.0)
MCH: 30.3 pg (ref 26.0–34.0)
MCHC: 34 g/dL (ref 30.0–36.0)
MCV: 89.1 fL (ref 78.0–100.0)
Monocytes Absolute: 0.6 10*3/uL (ref 0.1–1.0)
Monocytes Relative: 11 % (ref 3–12)
Neutro Abs: 3.3 10*3/uL (ref 1.7–7.7)
Neutrophils Relative %: 67 % (ref 43–77)
Platelets: 237 10*3/uL (ref 150–400)
RBC: 5.25 MIL/uL (ref 4.22–5.81)
RDW: 14.1 % (ref 11.5–15.5)
WBC: 4.9 10*3/uL (ref 4.0–10.5)

## 2010-09-06 LAB — LIPID PANEL
Cholesterol: 214 mg/dL — ABNORMAL HIGH (ref 0–200)
HDL: 40 mg/dL (ref >39)
LDL Cholesterol: 150 mg/dL — ABNORMAL HIGH (ref 0–99)
Total CHOL/HDL Ratio: 5.4 Ratio
Triglycerides: 119 mg/dL (ref <150)
VLDL: 24 mg/dL (ref 0–40)

## 2010-09-06 LAB — BASIC METABOLIC PANEL
BUN: 14 mg/dL (ref 6–23)
CO2: 28 mEq/L (ref 19–32)
Calcium: 10.1 mg/dL (ref 8.4–10.5)
Chloride: 105 mEq/L (ref 96–112)
Creat: 1.05 mg/dL (ref 0.50–1.35)
Glucose, Bld: 90 mg/dL (ref 70–99)
Potassium: 5.5 mEq/L — ABNORMAL HIGH (ref 3.5–5.3)
Sodium: 142 mEq/L (ref 135–145)

## 2010-09-06 LAB — TESTOSTERONE: Testosterone: 194.07 ng/dL — ABNORMAL LOW (ref 250–890)

## 2010-09-06 LAB — TSH: TSH: 0.971 u[IU]/mL (ref 0.350–4.500)

## 2010-09-06 LAB — PSA: PSA: 8.1 ng/mL — ABNORMAL HIGH (ref <=4.00)

## 2010-09-07 ENCOUNTER — Ambulatory Visit: Payer: Self-pay | Admitting: Family Medicine

## 2010-09-07 ENCOUNTER — Telehealth: Payer: Self-pay | Admitting: *Deleted

## 2010-09-07 ENCOUNTER — Telehealth: Payer: Self-pay | Admitting: Family Medicine

## 2010-09-07 DIAGNOSIS — R972 Elevated prostate specific antigen [PSA]: Secondary | ICD-10-CM

## 2010-09-07 NOTE — Telephone Encounter (Signed)
Left message to call back. Order placed in Epic for urology referral.

## 2010-09-07 NOTE — Telephone Encounter (Signed)
Message copied by Romualdo Bolk on Thu Sep 07, 2010 11:32 AM ------      Message from: Gershon Crane A      Created: Thu Sep 07, 2010 10:06 AM       His testosterone level is quite low, and his PSA is very elevated. We will refer to Urology for both of these issues.

## 2010-09-07 NOTE — Telephone Encounter (Signed)
See the test results

## 2010-09-08 ENCOUNTER — Telehealth: Payer: Self-pay | Admitting: Family Medicine

## 2010-09-08 NOTE — Telephone Encounter (Signed)
Spoke with pt

## 2010-09-11 ENCOUNTER — Telehealth: Payer: Self-pay | Admitting: Family Medicine

## 2010-09-11 NOTE — Telephone Encounter (Signed)
Spoke with pt and gave test result

## 2010-09-20 ENCOUNTER — Ambulatory Visit (INDEPENDENT_AMBULATORY_CARE_PROVIDER_SITE_OTHER): Payer: BC Managed Care – PPO | Admitting: Family Medicine

## 2010-09-20 ENCOUNTER — Telehealth: Payer: Self-pay

## 2010-09-20 DIAGNOSIS — E291 Testicular hypofunction: Secondary | ICD-10-CM

## 2010-09-20 NOTE — Telephone Encounter (Signed)
Pt had appointment at 3:30 but had to leave after a 45 min wait. Would like a call from [place] regarding the androgel that Dr. Clent Ridges was going to rx him for low testosterone

## 2010-09-21 ENCOUNTER — Encounter: Payer: Self-pay | Admitting: Family Medicine

## 2010-09-21 NOTE — Telephone Encounter (Signed)
Spoke with pt and he has an appt with urology.

## 2010-09-21 NOTE — Progress Notes (Signed)
  Subjective:    Patient ID: [name], male    DOB: 07/25/1960, 50 y.o.   MRN: [id number]  HPI    Review of Systems     Objective:   Physical Exam        Assessment & Plan:  He left without being seen

## 2010-09-27 NOTE — Telephone Encounter (Signed)
Opened in error

## 2010-10-16 ENCOUNTER — Ambulatory Visit (INDEPENDENT_AMBULATORY_CARE_PROVIDER_SITE_OTHER): Payer: BC Managed Care – PPO | Admitting: Family Medicine

## 2010-10-16 ENCOUNTER — Encounter: Payer: Self-pay | Admitting: Family Medicine

## 2010-10-16 VITALS — BP 116/70 | HR 76 | Temp 98.0°F | Wt 246.0 lb

## 2010-10-16 DIAGNOSIS — E291 Testicular hypofunction: Secondary | ICD-10-CM

## 2010-10-16 DIAGNOSIS — R972 Elevated prostate specific antigen [PSA]: Secondary | ICD-10-CM

## 2010-10-16 MED ORDER — CITALOPRAM HYDROBROMIDE 20 MG PO TABS: 20.0000 mg | ORAL_TABLET | Freq: Every day | ORAL | Status: DC

## 2010-10-16 NOTE — Progress Notes (Signed)
  Subjective:    Patient ID: [name], male    DOB: 11-Mar-1960, 50 y.o.   MRN: [id number]  HPIHere to follow up on labs and ask advice. On 09-05-10 we drew labs which showed a testosterone level of 194 and a PSA of 8.10. He had never had a PSA checked before. We had referred him to Urology. And in fact he is to see them 3 days from now. He had been using Androgel until about 7 months ago and has been off testosterone meds since then. He asks about what the labs may mean and what to do now.    Review of Systems  Constitutional: Negative.   Respiratory: Negative.   Cardiovascular: Negative.   Genitourinary: Negative.        Objective:   Physical Exam  Constitutional: He appears well-developed and well-nourished.          Assessment & Plan:  We discussed his hx of illicit anabolic steroid use in the past, and what his levels mean. I told him this often suppresses his own natural testosterone production. The high PSA come be from an infection or BPH, but obviously we are concerned about the possibility of cancer. I urged him to see Urology as planned and go from there.

## 2010-11-24 LAB — PROTIME-INR
INR: 1
Prothrombin Time: 13.5

## 2010-11-24 LAB — DIFFERENTIAL
Basophils Absolute: 0
Basophils Relative: 1
Eosinophils Absolute: 0.1
Eosinophils Relative: 1
Lymphocytes Relative: 23
Lymphs Abs: 1.1
Monocytes Absolute: 0.5
Monocytes Relative: 11
Neutro Abs: 3.2
Neutrophils Relative %: 65

## 2010-11-24 LAB — CBC
HCT: 45.6
Hemoglobin: 16
MCHC: 35.1
MCV: 86.6
Platelets: 224
RBC: 5.27
RDW: 14
WBC: 4.9

## 2010-11-24 LAB — APTT: aPTT: 26

## 2010-12-08 ENCOUNTER — Other Ambulatory Visit: Payer: Self-pay | Admitting: Urology

## 2010-12-08 ENCOUNTER — Encounter (HOSPITAL_COMMUNITY): Payer: BC Managed Care – PPO

## 2010-12-08 LAB — CBC
HCT: 45.6 % (ref 39.0–52.0)
Hemoglobin: 15.8 g/dL (ref 13.0–17.0)
MCH: 30.2 pg (ref 26.0–34.0)
MCHC: 34.6 g/dL (ref 30.0–36.0)
MCV: 87.2 fL (ref 78.0–100.0)
Platelets: 212 10*3/uL (ref 150–400)
RBC: 5.23 MIL/uL (ref 4.22–5.81)
RDW: 13.1 % (ref 11.5–15.5)
WBC: 4.8 10*3/uL (ref 4.0–10.5)

## 2010-12-08 LAB — SURGICAL PCR SCREEN
MRSA, PCR: NEGATIVE
Staphylococcus aureus: NEGATIVE

## 2010-12-13 ENCOUNTER — Inpatient Hospital Stay (HOSPITAL_COMMUNITY)
Admission: RE | Admit: 2010-12-13 | Discharge: 2010-12-15 | DRG: 335 | Disposition: A | Discharge status: Home / Self Care | Payer: BC Managed Care – PPO | Source: Ambulatory Visit | Attending: Urology | Admitting: Urology

## 2010-12-13 ENCOUNTER — Other Ambulatory Visit: Payer: Self-pay | Admitting: Urology

## 2010-12-13 DIAGNOSIS — R42 Dizziness and giddiness: Secondary | ICD-10-CM | POA: Diagnosis not present

## 2010-12-13 DIAGNOSIS — K219 Gastro-esophageal reflux disease without esophagitis: Secondary | ICD-10-CM | POA: Diagnosis present

## 2010-12-13 DIAGNOSIS — Z01812 Encounter for preprocedural laboratory examination: Secondary | ICD-10-CM

## 2010-12-13 DIAGNOSIS — C61 Malignant neoplasm of prostate: Principal | ICD-10-CM | POA: Diagnosis present

## 2010-12-13 HISTORY — PX: PROSTATE SURGERY: SHX751

## 2010-12-13 LAB — TYPE AND SCREEN
ABO/RH(D): A POS
Antibody Screen: NEGATIVE

## 2010-12-14 LAB — DIFFERENTIAL
Basophils Absolute: 0 10*3/uL (ref 0.0–0.1)
Basophils Relative: 0 % (ref 0–1)
Eosinophils Absolute: 0 10*3/uL (ref 0.0–0.7)
Eosinophils Relative: 0 % (ref 0–5)
Lymphocytes Relative: 5 % — ABNORMAL LOW (ref 12–46)
Lymphs Abs: 0.6 10*3/uL — ABNORMAL LOW (ref 0.7–4.0)
Monocytes Absolute: 1 10*3/uL (ref 0.1–1.0)
Monocytes Relative: 9 % (ref 3–12)
Neutro Abs: 9.5 10*3/uL — ABNORMAL HIGH (ref 1.7–7.7)
Neutrophils Relative %: 85 % — ABNORMAL HIGH (ref 43–77)

## 2010-12-14 LAB — BASIC METABOLIC PANEL
BUN: 10 mg/dL (ref 6–23)
CO2: 26 mEq/L (ref 19–32)
Calcium: 8.1 mg/dL — ABNORMAL LOW (ref 8.4–10.5)
Chloride: 99 mEq/L (ref 96–112)
Creatinine, Ser: 1.06 mg/dL (ref 0.50–1.35)
GFR calc Af Amer: >90 mL/min (ref >90)
GFR calc non Af Amer: 81 mL/min — ABNORMAL LOW (ref >90)
Glucose, Bld: 186 mg/dL — ABNORMAL HIGH (ref 70–99)
Potassium: 3.8 mEq/L (ref 3.5–5.1)
Sodium: 133 mEq/L — ABNORMAL LOW (ref 135–145)

## 2010-12-14 LAB — CBC
HCT: 36.2 % — ABNORMAL LOW (ref 39.0–52.0)
Hemoglobin: 12.2 g/dL — ABNORMAL LOW (ref 13.0–17.0)
MCH: 29.4 pg (ref 26.0–34.0)
MCHC: 33.7 g/dL (ref 30.0–36.0)
MCV: 87.2 fL (ref 78.0–100.0)
Platelets: 206 10*3/uL (ref 150–400)
RBC: 4.15 MIL/uL — ABNORMAL LOW (ref 4.22–5.81)
RDW: 13.2 % (ref 11.5–15.5)
WBC: 11.1 10*3/uL — ABNORMAL HIGH (ref 4.0–10.5)

## 2010-12-14 LAB — GLUCOSE, CAPILLARY: Glucose-Capillary: 157 mg/dL — ABNORMAL HIGH (ref 70–99)

## 2010-12-19 ENCOUNTER — Emergency Department (HOSPITAL_COMMUNITY)
Admission: EM | Admit: 2010-12-19 | Discharge: 2010-12-19 | Disposition: A | Discharge status: Home / Self Care | Payer: BC Managed Care – PPO | Attending: Emergency Medicine | Admitting: Emergency Medicine

## 2010-12-19 DIAGNOSIS — Z9079 Acquired absence of other genital organ(s): Secondary | ICD-10-CM | POA: Insufficient documentation

## 2010-12-19 DIAGNOSIS — R310 Gross hematuria: Secondary | ICD-10-CM | POA: Insufficient documentation

## 2010-12-19 DIAGNOSIS — R509 Fever, unspecified: Secondary | ICD-10-CM | POA: Insufficient documentation

## 2010-12-19 LAB — CBC
HCT: 30.3 % — ABNORMAL LOW (ref 39.0–52.0)
Hemoglobin: 10.3 g/dL — ABNORMAL LOW (ref 13.0–17.0)
MCH: 29.8 pg (ref 26.0–34.0)
MCHC: 34 g/dL (ref 30.0–36.0)
MCV: 87.6 fL (ref 78.0–100.0)
Platelets: 355 10*3/uL (ref 150–400)
RBC: 3.46 MIL/uL — ABNORMAL LOW (ref 4.22–5.81)
RDW: 14.3 % (ref 11.5–15.5)
WBC: 16.8 10*3/uL — ABNORMAL HIGH (ref 4.0–10.5)

## 2010-12-19 LAB — DIFFERENTIAL
Basophils Absolute: 0 10*3/uL (ref 0.0–0.1)
Basophils Relative: 0 % (ref 0–1)
Eosinophils Absolute: 0 10*3/uL (ref 0.0–0.7)
Eosinophils Relative: 0 % (ref 0–5)
Lymphocytes Relative: 4 % — ABNORMAL LOW (ref 12–46)
Lymphs Abs: 0.7 10*3/uL (ref 0.7–4.0)
Monocytes Absolute: 1.1 10*3/uL — ABNORMAL HIGH (ref 0.1–1.0)
Monocytes Relative: 7 % (ref 3–12)
Neutro Abs: 15 10*3/uL — ABNORMAL HIGH (ref 1.7–7.7)
Neutrophils Relative %: 89 % — ABNORMAL HIGH (ref 43–77)

## 2010-12-20 LAB — URINE CULTURE
Colony Count: 2000
Culture  Setup Time: 201210170119

## 2010-12-20 NOTE — Consult Note (Signed)
  [name], NARULA               ACCOUNT NO.:  [id number]  MEDICAL RECORD NO.:  [id number]  LOCATION:  WLED                         FACILITY:  [facility]  PHYSICIAN:  Valetta Fuller, MD    DATE OF BIRTH:  11-02-1960  DATE OF CONSULTATION: DATE OF DISCHARGE:  12/19/2010                                CONSULTATION   CHIEF COMPLAINT:  Low-grade fever and hematuria.  HISTORY OF PRESENT ILLNESS:  [name] is 50 years of age.  He is approximately 1-week status post robotic prostatectomy for prostate cancer.  His surgery and initial postoperative course were uncomplicated.  I was leaving the [facility] Emergency Room this evening when I was stopped by the patient's wife did tell me that they were there in the triage/holding area waiting to be assessed.  We brought [name] into the triage room and discussed the situation with him.  He has had a low-grade fever this evening.  He also accidentally tugged on his Foley catheter and noticed some hematuria, although that appears to be resolving and his catheter has been draining.  The patient has started oral ciprofloxacin this morning in anticipation of Foley catheter removal in a few days.  He had gone to the emergency room to have these problems assess.  He has had no other issues.  PAST MEDICAL HISTORY:  Otherwise, unremarkable.  CURRENT MEDICATIONS:  Include ciprofloxacin.  ALLERGIES:  He has an allergy or intolerance to PERCOCET.  PHYSICAL EXAMINATION:  GENERAL:  The patient was a well-developed, well- nourished male.  He appeared to be in no acute distress. VITAL SIGNS:  His temperature was 99.7 with a blood pressure 119/73. GENITOURINARY:  His incisions all appear to be healing well.  He did have some ecchymosis and bruising on the side of his abdomen.  His scrotum was also ecchymotic, but not enlarged.  Foley catheter appeared to be in good position, was draining light pink-tinged urine without clots. EXTREMITIES: Without  edema.  ASSESSMENT:  Probable febrile urinary tract infection with indwelling catheter.  I have instructed the hospital staff to obtain the urine culture to administer 1 g of Rocephin and obtain CBC.  I do not see a need for hospitalization at this point.  He will continue to drink increased amounts of p.o. fluid and will notify me tomorrow morning if his situation has worsened.  Otherwise, he will keep his routine followup later in the week for reassessment and probable voiding trial with Foley catheter removal.     Valetta Fuller, MD     DSG/MEDQ  D:  12/19/2010  T:  12/20/2010  Job:  [id number]  Electronically Signed by Barron Alvine M.D. on 12/20/2010 10:04:36 AM

## 2010-12-20 NOTE — Op Note (Signed)
NAMESTOKES, RATTIGAN               ACCOUNT NO.:  [id number]  MEDICAL RECORD NO.:  [id number]  LOCATION:  1419                         FACILITY:  [facility]  PHYSICIAN:  Valetta Fuller, MD    DATE OF BIRTH:  September 22, 1960  DATE OF PROCEDURE: DATE OF DISCHARGE:                              OPERATIVE REPORT   PREOPERATIVE DIAGNOSIS:  Adenocarcinoma of the prostate.  POSTOPERATIVE DIAGNOSE:  Adenocarcinoma of the prostate.  PROCEDURE PERFORMED:  Robotic assisted laparoscopic radical retropubic prostatectomy with bilateral pelvic lymph node dissection.  SURGEON:  Valetta Fuller, MD.  ASSISTANT:  Natalia Leatherwood, MD.  ANESTHESIA:  General endotracheal.  INDICATIONS:  Mr. Tabet is 50 years of age.  He presented with an elevated PSA of approximately 8.  Digital rectal exam was unremarkable. Biopsies confirmed adenocarcinoma of the prostate that was bilaterally present at the apex and involved 2/12 cores.  Gleason score was 4+3=7 bilaterally.  Patient underwent extensive counseling with regard to treatment options.  He appeared to understand the distinct advantages as well as disadvantages of robotic prostatectomy as well as potential complications.  Full informed consent has been obtained.  The patient now presents for the procedure.  The patient has done a mechanical bowel prep and has had placement of PAS compression boots.  He has received perioperative Unasyn.  TECHNIQUE AND FINDINGS:  The patient was brought to the operating room. He had successful induction of general endotracheal anesthesia.  He was placed in the mid lithotomy position and carefully had his extremities padded.  He was secured to the table.  He was prepped and draped in the usual manner and placed in a steep Trendelenburg position after being secured to the operative table.  Appropriate surgical time-out was performed.  A Foley catheter was placed sterilely on the field.  Camera port incision was chosen 18 cm  above the pubic symphysis just to the left of the umbilicus.  Standard open Hasson technique was utilized with no difficulty inserting the initial trocar or insufflating the abdomen.  All other trocars were placed with visual guidance including a 12 mm and 5 mm assist port and three 8 mm robotic trocars.  Once trocars were placed, the surgical cart was docked.  The bladder was filled and the space of Retzius developed with blunt dissection as well as electrocautery scissors.  Superficial fat off the bladder neck and endopelvic fascia was removed with electrocautery scissors.  Endopelvic fascia was then incised widely from base to apex.  Levator musculature was swept off the apex of the prostate and the dorsal venous complex was stapled with an ETS stapling device with excellent hemostasis.  The Foley balloon catheter was used to help identify the anterior bladder neck which was transected with the electrocautery scissors down to the Foley catheter which was then removed and retracted anteriorly.  Indigo carmine was given.  We were well away from the ureteral orifices and there was no evidence of a middle lobe.  The posterior bladder neck was transected and this dissection carried down to the bilateral vas deferens, which were transected and then retracted anteriorly.  Both seminal vesicles were also dissected free and no difficulty was obtained in  the dissection of these structures.  The posterior plane between the rectum and prostate was then easily established with primarily blunt dissecting technique.  The patient was felt to be a candidate for left aggressive bilateral nerve spare with great care around the apex.  Superficial lateral fascia was incised and swept posterior laterally until we were able to establish nice grooves between the posterior lateral aspect of the prostate and the neurovascular bundle.  The pedicles on both sides were taken down with an EnSeal device.  With the  Foley in the urethra, the anterior urethra was transected and then the posterior urethra transected.  The prostatic specimen was then removed from the hemipelvis.  Pelvic irrigation and rectal insufflation revealed no evidence of rectal injury.  Attention was turned to bilateral pelvic lymph node dissection. Obturator packets extending to the bifurcation of the common iliac artery were taken bilaterally.  Clips were used for small veins and lymphatics.  Obturator nerves were identified bilaterally.  Lymph node tissue was sent bilaterally for permanent analysis only.  Attention was then turned to reconstruction.  The posterior bladder neck and posterior urethra were reapproximated with an interrupted 2-0 Vicryl suture.  The rest of the anastomosis was done with a double-armed 3-0 Monocryl suture in a 360 degree running manner.  A new coude catheter was placed and irrigation revealed blue irrigant from the indigo carmine.  No evidence of bleeding and no evidence of leakage.  A pelvic drain was placed through one of the robotic trocars and secured to the skin.  The prostate specimen was placed in the Endopouch retrieval bag. The 12 mm trocar site was closed with a Vicryl suture with the aid of a suture passer.  All other trocars were taken out with direct visual guidance.  Marcaine was then infiltrated into all port sites and these were closed with clips. Estimated blood loss was 200 cc.  No obvious complications or problems occurred and the patient was brought to recovery room in stable condition.     Valetta Fuller, MD     DSG/MEDQ  D:  12/13/2010  T:  12/14/2010  Job:  [id number]  Electronically Signed by Barron Alvine M.D. on 12/20/2010 10:04:26 AM

## 2010-12-20 NOTE — Discharge Summary (Signed)
  NAMEYESENIA, FONTENETTE               ACCOUNT NO.:  [id number]  MEDICAL RECORD NO.:  [id number]  LOCATION:  1419                         FACILITY:  [facility]  PHYSICIAN:  Valetta Fuller, MD    DATE OF BIRTH:  03/27/1960  DATE OF ADMISSION:  12/13/2010 DATE OF DISCHARGE:  12/15/2010                              DISCHARGE SUMMARY   DISCHARGE DIAGNOSIS:  Adenocarcinoma of the prostate, pathologic stage pT2c.  PROCEDURE PERFORMED:  Robotic-assisted laparoscopic radical retropubic prostatectomy with bilateral pelvic lymph node dissection on December 13, 2010.  HOSPITAL COURSE:  Mr. Consiglio is 50 years of age.  He was diagnosed with adenocarcinoma of the prostate several weeks ago.  The patient was noted at that time to have 2/12 cores positive at the apex for Gleason's 4+3=7 cancer.  PSA was approximately 8.  The patient elected to have a robotic prostatectomy, which was performed on December 13, 2010.  The surgery itself was uneventful, blood loss was minimal.  The patient's postoperative labs were unremarkable.  On postoperative day #1, the patient did have some nausea and light-headedness.  He was noted to be mildly hypertensive, but had a stable hemoglobin.  He responded well to a fluid bolus.  Because of some discomfort, bladder spasms, as well as some generalized dizziness, he was kept an additional 24 hours for admission purposes.  The patient subsequently had resolution of his symptoms.  The patient remained afebrile with stable vital signs.  He had excellent urinary output, remained clear.  The patient's CBC and BMET were unremarkable.  His Jackson-Pratt drain was removed on postoperative day #1.  The patient was tolerating a general diet well and ambulating.  He had had a bowel movement.  DISPOSITION:  The patient was discharged to home with an indwelling Foley catheter and catheter care instructions.  Followup will be in approximately 8-10 days.  The patient was sent home with  prescriptions for Colace, Vicodin, Ditropan for bladder spasms and antibiotic to start several days before catheter removal.     Valetta Fuller, MD     DSG/MEDQ  D:  12/15/2010  T:  12/16/2010  Job:  [id number]  Electronically Signed by Barron Alvine M.D. on 12/20/2010 10:04:32 AM

## 2010-12-28 NOTE — H&P (Signed)
  [name], [name]               ACCOUNT NO.:  [id number]  MEDICAL RECORD NO.:  [id number]  LOCATION:  1419                         FACILITY:  [facility]  PHYSICIAN:  Valetta Fuller, MD    DATE OF BIRTH:  12-Aug-1960  DATE OF ADMISSION:  12/13/2010 DATE OF DISCHARGE:  12/15/2010                             HISTORY & PHYSICAL   ADMITTING DIAGNOSIS:  Adenocarcinoma of the prostate.  HISTORY OF PRESENT ILLNESS:  [name] is 50 years of age.  The patient was found have an elevated PSA of 8.  Digital rectal exam was unremarkable.  Ultrasound of the prostate revealed adenocarcinoma of the prostate in 2/12 cores.  At that time, the biopsies revealed Gleason's 4+ 3 equals 7 cancer in 2 cores, 1 on each side of the prostate in the region of the apex.  The patient was completely asymptomatic and had no other urologic complaints or issues.  He really has had no other urologic history.  The patient underwent extensive discussion about treatment options and elected to have a laparoscopic-assisted robotic prostatectomy with bilateral pelvic lymph node dissections.  He presents today for that procedure and is to be admitted for at least 23-hour observation status post the procedure.  PAST MEDICAL HISTORY:  Unremarkable.  The patient has had some issues with prior steroid use and has had a hip replacement.  He has had no other medical problems.  He takes some nonsteroidal anti-inflammatories, which have been on hold.  MEDICATIONS:  Ibuprofen.  ALLERGIES/INTOLERANCES:  OXYCODONE, which causes nausea and vomiting.  SOCIAL HISTORY:  Negative for tobacco use.  REVIEW OF SYSTEMS:  Otherwise negative.  PHYSICAL EXAMINATION:  GENERAL:  The patient is a well-developed, well- nourished male. VITAL SIGNS:  He was afebrile with blood pressure 122/78, pulse of 82, and respiratory rate of 18. NECK:  Showed no obvious masses or JVD.  RESPIRATORY:  Normal effort. HEART:  Regular rate and rhythm.   ABDOMEN:  Soft, nontender.  No palpable masses, hepatosplenomegaly or obvious hernias.  GENITOURINARY: Revealed normal penis, scrotum, testes, and adnexal structures. RECTAL:  Showed 1+ prostate without nodules or induration. EXTREMITIES:  Without edema or tenderness.  ASSESSMENT AND PLAN:  Intermediate risk clinical stage T1c adenocarcinoma of the prostate.  The patient is to undergo robotic assisted laparoscopic radical retropubic prostatectomy with bilateral pelvic lymph node dissections.  He will hopefully be admitted for routine postoperative care status post that procedure.     Valetta Fuller, MD     DSG/MEDQ  D:  12/25/2010  T:  12/25/2010  Job:  [id number]  Electronically Signed by Barron Alvine M.D. on 12/28/2010 08:50:20 AM

## 2011-01-23 ENCOUNTER — Ambulatory Visit (INDEPENDENT_AMBULATORY_CARE_PROVIDER_SITE_OTHER): Payer: BC Managed Care – PPO | Admitting: Family Medicine

## 2011-01-23 ENCOUNTER — Encounter: Payer: Self-pay | Admitting: Family Medicine

## 2011-01-23 VITALS — BP 126/84 | HR 73 | Temp 98.6°F | Wt 231.0 lb

## 2011-01-23 DIAGNOSIS — C61 Malignant neoplasm of prostate: Secondary | ICD-10-CM

## 2011-01-23 DIAGNOSIS — L0390 Cellulitis, unspecified: Secondary | ICD-10-CM

## 2011-01-23 DIAGNOSIS — E785 Hyperlipidemia, unspecified: Secondary | ICD-10-CM

## 2011-01-23 DIAGNOSIS — L0291 Cutaneous abscess, unspecified: Secondary | ICD-10-CM

## 2011-01-23 MED ORDER — CEPHALEXIN 500 MG PO CAPS: 500.0000 mg | ORAL_CAPSULE | Freq: Four times a day (QID) | ORAL | Status: AC

## 2011-01-23 MED ORDER — DICLOFENAC SODIUM 75 MG PO TBEC: 75.0000 mg | DELAYED_RELEASE_TABLET | Freq: Two times a day (BID) | ORAL | Status: DC

## 2011-01-23 NOTE — Progress Notes (Signed)
  Subjective:    Patient ID: [name], male    DOB: 03-29-1960, 50 y.o.   MRN: [id number]  HPI Here to check a surgical wound that may be infected. He was found to have prostate cancer this past summer, and he had a robotic prostatectomy per Dr. Isabel Caprice on 12-13-10. He did well with the surgery, but over the past several weeks one of the surgical sites started to get red and tender, and it has had some serous drainage from it. No abdominal pain or fever.    Review of Systems  Constitutional: Negative.   Respiratory: Negative.   Cardiovascular: Negative.   Gastrointestinal: Negative.   Skin: Positive for wound.       Objective:   Physical Exam  Constitutional: He appears well-developed and well-nourished.  Abdominal: Soft. Bowel sounds are normal. He exhibits no distension and no mass. There is no tenderness. There is no rebound and no guarding.       There are several punctate surgical wounds, most of which are healing well. One wound in the RLQ is scabbed over, but is surrounded by a 7 cm area of erythema and warmth. No tenderness.           Assessment & Plan:  The area has some cellulitis around it. Cover with Keflex. Recheck if not better in 2 weeks. We will set up another lipid panel soon.

## 2011-02-21 ENCOUNTER — Other Ambulatory Visit: Payer: Self-pay | Admitting: Physical Medicine and Rehabilitation

## 2011-02-21 DIAGNOSIS — M25559 Pain in unspecified hip: Secondary | ICD-10-CM

## 2011-02-22 ENCOUNTER — Telehealth: Payer: Self-pay | Admitting: Family Medicine

## 2011-02-22 MED ORDER — CEPHALEXIN 500 MG PO CAPS: 500.0000 mg | ORAL_CAPSULE | Freq: Four times a day (QID) | ORAL | Status: AC

## 2011-02-22 NOTE — Telephone Encounter (Signed)
Pt was given medication for a Skin rash on 01/23/11 the rash was almost cleared up and.the patient went to tanning bed and rash has come back and pt has no more of the medication and is requesting a refill.  [facility] [employer]

## 2011-02-22 NOTE — Telephone Encounter (Signed)
Call in Keflex 500 mg qid, #40 with one rf

## 2011-02-22 NOTE — Telephone Encounter (Signed)
Script sent e-scribe and pt aware. 

## 2011-02-23 ENCOUNTER — Other Ambulatory Visit (INDEPENDENT_AMBULATORY_CARE_PROVIDER_SITE_OTHER): Payer: BC Managed Care – PPO

## 2011-02-23 DIAGNOSIS — E785 Hyperlipidemia, unspecified: Secondary | ICD-10-CM

## 2011-02-23 LAB — LIPID PANEL
Cholesterol: 195 mg/dL (ref 0–200)
HDL: 38.4 mg/dL — ABNORMAL LOW (ref >39.00)
LDL Cholesterol: 139 mg/dL — ABNORMAL HIGH (ref 0–99)
Total CHOL/HDL Ratio: 5
Triglycerides: 87 mg/dL (ref 0.0–149.0)
VLDL: 17.4 mg/dL (ref 0.0–40.0)

## 2011-02-26 NOTE — Progress Notes (Signed)
Quick Note:  Left voice message ______ 

## 2011-03-01 ENCOUNTER — Encounter: Payer: Self-pay | Admitting: Family Medicine

## 2011-03-01 ENCOUNTER — Ambulatory Visit (INDEPENDENT_AMBULATORY_CARE_PROVIDER_SITE_OTHER): Payer: BC Managed Care – PPO | Admitting: Family Medicine

## 2011-03-01 VITALS — BP 110/70 | Temp 98.7°F | Wt 240.0 lb

## 2011-03-01 DIAGNOSIS — L259 Unspecified contact dermatitis, unspecified cause: Secondary | ICD-10-CM

## 2011-03-01 MED ORDER — METHYLPREDNISOLONE ACETATE 80 MG/ML IJ SUSP
80.0000 mg | Freq: Once | INTRAMUSCULAR | Status: AC
  Administered 2011-03-01: 80 mg via INTRAMUSCULAR

## 2011-03-01 MED ORDER — PREDNISONE 20 MG PO TABS: 20.0000 mg | ORAL_TABLET | Freq: Every day | ORAL | Status: AC

## 2011-03-01 NOTE — Progress Notes (Signed)
Subjective:    Patient ID: [name], male    DOB: Sep 02, 1960, 50 y.o.   MRN: [id number]  HPI  50 year old white male, nonsmoker, patient of Dr. Clent Ridges in complaints of a rash Cymbalta for 3 weeks. Describes the rash as itchy it is located on his lower back,  the back of his legs, and his elbows. He believes it came from laying in a tanning bathe. Dr. Clent Ridges started him on cephalexin but has not been effective. The rash worsens as the day progresses, and forms a well-like appearance. He has also been using Gold Bond that is not effective.  Review of Systems  Constitutional: Negative.   Respiratory: Negative.   Cardiovascular: Negative.   Genitourinary: Negative.   Musculoskeletal: Negative.   Skin:       Rash noted  Hematological: Negative.   Psychiatric/Behavioral: Negative.        Past Medical History  Diagnosis Date  . Colon polyps   . Depression   . GERD (gastroesophageal reflux disease)   . Arthritis   . Osteopenia   . Low back pain   . Cancer     prostate cancer    History   Social History  . Marital Status: Married    Spouse Name: N/A    Number of Children: N/A  . Years of Education: N/A   Occupational History  . Not on file.   Social History Main Topics  . Smoking status: Never Smoker   . Smokeless tobacco: Never Used  . Alcohol Use: Yes  . Drug Use: No  . Sexually Active: Not on file   Other Topics Concern  . Not on file   Social History Narrative  . No narrative on file    Past Surgical History  Procedure Date  . Cystectomy     benign, right hip  . Laminectomy 2/09    lumbar  . Gynecomastia excision     bilateral, reduction  . 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 Lumbar microdiscectomy 10/15/08    L-4-5  . Prostate surgery 12-13-10    robotic prostatectomy per Dr. Isabel Caprice     Family History  Problem Relation Age of Onset  . Arthritis    . Hypertension      No Known Allergies  Current Outpatient Prescriptions on File Prior to Visit    Medication Sig Dispense Refill  . cephALEXin (KEFLEX) 500 MG capsule Take 1 capsule (500 mg total) by mouth 4 (four) times daily.  40 capsule  1  . diclofenac (VOLTAREN) 75 MG EC tablet Take 1 tablet (75 mg total) by mouth 2 (two) times daily.  60 tablet  11  . fish oil-omega-3 fatty acids 1000 MG capsule Take 2 g by mouth daily.        . Probiotic Product (ALIGN) 4 MG CAPS Take 1 tablet by mouth daily.         No current facility-administered medications on file prior to visit.    BP 110/70  Temp(Src) 98.7 F (37.1 C) (Oral)  Wt 240 lb (108.863 kg)chart Objective:   Physical Exam  Constitutional: He is oriented to person, place, and time. He appears well-developed and well-nourished.  Cardiovascular: Normal rate and regular rhythm.   Pulmonary/Chest: Effort normal and breath sounds normal.  Neurological: He is alert and oriented to person, place, and time.  Skin: Skin is warm and dry. Rash noted.       Pruritic appearing rash noted to the small of the back, posterior calves,  and elbows bilaterally.  Psychiatric: He has a normal mood and affect.          Assessment & Plan:   Assessment: Contact dermatitis  Plan: Depo-Medrol 80 mg IM x1. Prednisone 20 mg 1 by mouth daily x5 days. Patient call if symptoms worsen or persist. Discontinue cephalexin. Recheck as scheduled and when necessary.

## 2011-03-01 NOTE — Patient Instructions (Signed)
Contact Dermatitis Contact dermatitis is a reaction to certain substances that touch the skin. Contact dermatitis can be either irritant contact dermatitis or allergic contact dermatitis. Irritant contact dermatitis does not require previous exposure to the substance for a reaction to occur. Allergic contact dermatitis only occurs if you have been exposed to the substance before. Upon a repeat exposure, your body reacts to the substance.   CAUSES   Many substances can cause contact dermatitis. Irritant dermatitis is most commonly caused by repeated exposure to mildly irritating substances, such as:  Makeup.     Soaps.    Detergents.    Bleaches.    Acids.    Metal salts, such as nickel.  Allergic contact dermatitis is most commonly caused by exposure to:  Poisonous plants.     Chemicals (deodorants, shampoos).     Jewelry.    Latex.    Neomycin in triple antibiotic cream.     Preservatives in products, including clothing.  SYMPTOMS   The area of skin that is exposed may develop:  Dryness or flaking.     Redness.    Cracks.    Itching.    Pain or a burning sensation.     Blisters.  With allergic contact dermatitis, there may also be swelling in areas such as the eyelids, mouth, or genitals.   DIAGNOSIS   Your caregiver can usually tell what the problem is by doing a physical exam. In cases where the cause is uncertain and an allergic contact dermatitis is suspected, a patch skin test may be performed to help determine the cause of your dermatitis. TREATMENT Treatment includes protecting the skin from further contact with the irritating substance by avoiding that substance if possible. Barrier creams, powders, and gloves may be helpful. Your caregiver may also recommend:  Steroid creams or ointments applied 2 times daily. For best results, soak the rash area in cool water for 20 minutes. Then apply the medicine. Cover the area with a plastic wrap. You can store the  steroid cream in the refrigerator for a "chilly" effect on your rash. That may decrease itching. Oral steroid medicines may be needed in more severe cases.     Antibiotics or antibacterial ointments if a skin infection is present.     Antihistamine lotion or an antihistamine taken by mouth to ease itching.     Lubricants to keep moisture in your skin.     Burow's solution to reduce redness and soreness or to dry a weeping rash. Mix one packet or tablet of solution in 2 cups cool water. Dip a clean washcloth in the mixture, wring it out a bit, and put it on the affected area. Leave the cloth in place for 30 minutes. Do this as often as possible throughout the day.     Taking several cornstarch or baking soda baths daily if the area is too large to cover with a washcloth.  Harsh chemicals, such as alkalis or acids, can cause skin damage that is like a burn. You should flush your skin for 15 to 20 minutes with cold water after such an exposure. You should also seek immediate medical care after exposure. Bandages (dressings), antibiotics, and pain medicine may be needed for severely irritated skin.   HOME CARE INSTRUCTIONS  Avoid the substance that caused your reaction.     Keep the area of skin that is affected away from hot water, soap, sunlight, chemicals, acidic substances, or anything else that would irritate your skin.       Do not scratch the rash. Scratching may cause the rash to become infected.     You may take cool baths to help stop the itching.     Only take over-the-counter or prescription medicines as directed by your caregiver.     See your caregiver for follow-up care as directed to make sure your skin is healing properly.  SEEK MEDICAL CARE IF:    Your condition is not better after 3 days of treatment.     You seem to be getting worse.     You see signs of infection such as swelling, tenderness, redness, soreness, or warmth in the affected area.     You have any problems  related to your medicines.  Document Released: 02/17/2000 Document Revised: 11/01/2010 Document Reviewed: 07/25/2010 [employer] Patient Information 2012 [employer]. 

## 2011-11-08 ENCOUNTER — Encounter: Payer: Self-pay | Admitting: Family Medicine

## 2011-11-08 ENCOUNTER — Ambulatory Visit (INDEPENDENT_AMBULATORY_CARE_PROVIDER_SITE_OTHER): Payer: BC Managed Care – PPO | Admitting: Family Medicine

## 2011-11-08 VITALS — BP 122/76 | HR 68 | Temp 98.5°F | Wt 247.0 lb

## 2011-11-08 DIAGNOSIS — Z01818 Encounter for other preprocedural examination: Secondary | ICD-10-CM

## 2011-11-08 NOTE — Progress Notes (Signed)
  Subjective:    Patient ID: [name], male    DOB: [birth year], 51 y.o.   MRN: [id number]  HPI 51 yr old male for preoperative clearance. He is scheduled for a right hip total arthroplasty per Dr. Lequita Halt on 12-26-11. Other than hip pain and low back pain he is doing well. He has been seeing Dr. Isabel Caprice to follow up his prostatectomy, and all his PSAs have been zero.    Review of Systems  Constitutional: Negative.   HENT: Negative.   Eyes: Negative.   Respiratory: Negative.   Cardiovascular: Negative.   Gastrointestinal: Negative.   Genitourinary: Negative.   Musculoskeletal: Positive for back pain, arthralgias and gait problem. Negative for myalgias and joint swelling.  Skin: Negative.   Neurological: Negative.   Hematological: Negative.   Psychiatric/Behavioral: Negative.        Objective:   Physical Exam  Constitutional: He is oriented to person, place, and time. He appears well-developed and well-nourished. No distress.  HENT:  Head: Normocephalic and atraumatic.  Right Ear: External ear normal.  Left Ear: External ear normal.  Nose: Nose normal.  Mouth/Throat: Oropharynx is clear and moist. No oropharyngeal exudate.  Eyes: Conjunctivae and EOM are normal. Pupils are equal, round, and reactive to light. Right eye exhibits no discharge. Left eye exhibits no discharge. No scleral icterus.  Neck: Neck supple. No JVD present. No tracheal deviation present. No thyromegaly present.  Cardiovascular: Normal rate, regular rhythm, normal heart sounds and intact distal pulses.  Exam reveals no gallop and no friction rub.   No murmur heard.      EKG normal   Pulmonary/Chest: Effort normal and breath sounds normal. No respiratory distress. He has no wheezes. He has no rales. He exhibits no tenderness.  Abdominal: Soft. Bowel sounds are normal. He exhibits no distension and no mass. There is no tenderness. There is no rebound and no guarding.  Genitourinary: Rectum normal, prostate  normal and penis normal. Guaiac negative stool. No penile tenderness.  Musculoskeletal: Normal range of motion. He exhibits no edema and no tenderness.  Lymphadenopathy:    He has no cervical adenopathy.  Neurological: He is alert and oriented to person, place, and time. He has normal reflexes. No cranial nerve deficit. He exhibits normal muscle tone. Coordination normal.  Skin: Skin is warm and dry. No rash noted. He is not diaphoretic. No erythema. No pallor.  Psychiatric: He has a normal mood and affect. His behavior is normal. Judgment and thought content normal.          Assessment & Plan:  Presurgical clearance. He will return soon for labs. Based on these results we will determine his status.

## 2011-11-12 ENCOUNTER — Other Ambulatory Visit (INDEPENDENT_AMBULATORY_CARE_PROVIDER_SITE_OTHER): Payer: BC Managed Care – PPO

## 2011-11-12 DIAGNOSIS — Z01818 Encounter for other preprocedural examination: Secondary | ICD-10-CM

## 2011-11-12 DIAGNOSIS — M5410 Radiculopathy, site unspecified: Secondary | ICD-10-CM | POA: Insufficient documentation

## 2011-11-12 LAB — CBC WITH DIFFERENTIAL/PLATELET
Basophils Absolute: 0 10*3/uL (ref 0.0–0.1)
Basophils Relative: 0.9 % (ref 0.0–3.0)
Eosinophils Absolute: 0.1 10*3/uL (ref 0.0–0.7)
Eosinophils Relative: 2.1 % (ref 0.0–5.0)
HCT: 46 % (ref 39.0–52.0)
Hemoglobin: 15.5 g/dL (ref 13.0–17.0)
Lymphocytes Relative: 22.8 % (ref 12.0–46.0)
Lymphs Abs: 1.3 10*3/uL (ref 0.7–4.0)
MCHC: 33.7 g/dL (ref 30.0–36.0)
MCV: 89.1 fl (ref 78.0–100.0)
Monocytes Absolute: 0.7 10*3/uL (ref 0.1–1.0)
Monocytes Relative: 11.7 % (ref 3.0–12.0)
Neutro Abs: 3.5 10*3/uL (ref 1.4–7.7)
Neutrophils Relative %: 62.5 % (ref 43.0–77.0)
Platelets: 225 10*3/uL (ref 150.0–400.0)
RBC: 5.16 Mil/uL (ref 4.22–5.81)
RDW: 13.8 % (ref 11.5–14.6)
WBC: 5.6 10*3/uL (ref 4.5–10.5)

## 2011-11-12 LAB — HEPATIC FUNCTION PANEL
ALT: 36 U/L (ref 0–53)
AST: 30 U/L (ref 0–37)
Albumin: 4.2 g/dL (ref 3.5–5.2)
Alkaline Phosphatase: 79 U/L (ref 39–117)
Bilirubin, Direct: 0.1 mg/dL (ref 0.0–0.3)
Total Bilirubin: 0.6 mg/dL (ref 0.3–1.2)
Total Protein: 7.1 g/dL (ref 6.0–8.3)

## 2011-11-12 LAB — LIPID PANEL
Cholesterol: 199 mg/dL (ref 0–200)
HDL: 41.6 mg/dL (ref >39.00)
LDL Cholesterol: 141 mg/dL — ABNORMAL HIGH (ref 0–99)
Total CHOL/HDL Ratio: 5
Triglycerides: 80 mg/dL (ref 0.0–149.0)
VLDL: 16 mg/dL (ref 0.0–40.0)

## 2011-11-12 LAB — BASIC METABOLIC PANEL
BUN: 14 mg/dL (ref 6–23)
CO2: 28 mEq/L (ref 19–32)
Calcium: 9.2 mg/dL (ref 8.4–10.5)
Chloride: 107 mEq/L (ref 96–112)
Creatinine, Ser: 0.9 mg/dL (ref 0.4–1.5)
GFR: 91.56 mL/min (ref >60.00)
Glucose, Bld: 94 mg/dL (ref 70–99)
Potassium: 4.6 mEq/L (ref 3.5–5.1)
Sodium: 143 mEq/L (ref 135–145)

## 2011-11-12 LAB — TSH: TSH: 2.28 u[IU]/mL (ref 0.35–5.50)

## 2011-11-16 NOTE — Progress Notes (Signed)
Quick Note:  Called and spoke with pt and pt is aware. ______ 

## 2011-12-05 ENCOUNTER — Other Ambulatory Visit: Payer: Self-pay | Admitting: Orthopedic Surgery

## 2011-12-05 MED ORDER — BUPIVACAINE LIPOSOME 1.3 % IJ SUSP: 20.0000 mL | Freq: Once | INTRAMUSCULAR | Status: DC

## 2011-12-05 MED ORDER — DEXAMETHASONE SODIUM PHOSPHATE 10 MG/ML IJ SOLN: 10.0000 mg | Freq: Once | INTRAMUSCULAR | Status: DC

## 2011-12-05 NOTE — Progress Notes (Signed)
Preoperative surgical orders have been place into the [facility] hospital system for [name] on 12/05/2011, 4:28 PM  by Patrica Duel for surgery on 12/26/2011.  Preop Total Hip orders including Experel Injecion, IV Tylenol, and IV Decadron as long as there are no contraindications to the above medications. Avel Peace, PA-C

## 2011-12-17 ENCOUNTER — Encounter (HOSPITAL_COMMUNITY): Payer: Self-pay | Admitting: Pharmacy Technician

## 2011-12-20 ENCOUNTER — Encounter (HOSPITAL_COMMUNITY): Payer: Self-pay

## 2011-12-20 ENCOUNTER — Ambulatory Visit (HOSPITAL_COMMUNITY)
Admission: RE | Admit: 2011-12-20 | Discharge: 2011-12-20 | Disposition: A | Discharge status: Home / Self Care | Payer: BC Managed Care – PPO | Source: Ambulatory Visit | Attending: Orthopedic Surgery | Admitting: Orthopedic Surgery

## 2011-12-20 ENCOUNTER — Encounter (HOSPITAL_COMMUNITY)
Admission: RE | Admit: 2011-12-20 | Discharge: 2011-12-20 | Disposition: A | Discharge status: Home / Self Care | Payer: BC Managed Care – PPO | Source: Ambulatory Visit | Attending: Orthopedic Surgery | Admitting: Orthopedic Surgery

## 2011-12-20 DIAGNOSIS — Z01812 Encounter for preprocedural laboratory examination: Secondary | ICD-10-CM | POA: Insufficient documentation

## 2011-12-20 DIAGNOSIS — Z01818 Encounter for other preprocedural examination: Secondary | ICD-10-CM | POA: Insufficient documentation

## 2011-12-20 DIAGNOSIS — Z96649 Presence of unspecified artificial hip joint: Secondary | ICD-10-CM | POA: Insufficient documentation

## 2011-12-20 HISTORY — PX: JOINT REPLACEMENT: SHX530

## 2011-12-20 HISTORY — PX: TOTAL HIP ARTHROPLASTY: SHX124

## 2011-12-20 LAB — APTT: aPTT: 34 seconds (ref 24–37)

## 2011-12-20 LAB — COMPREHENSIVE METABOLIC PANEL
ALT: 36 U/L (ref 0–53)
AST: 24 U/L (ref 0–37)
Albumin: 4.2 g/dL (ref 3.5–5.2)
Alkaline Phosphatase: 103 U/L (ref 39–117)
BUN: 11 mg/dL (ref 6–23)
CO2: 29 mEq/L (ref 19–32)
Calcium: 9.4 mg/dL (ref 8.4–10.5)
Chloride: 102 mEq/L (ref 96–112)
Creatinine, Ser: 0.9 mg/dL (ref 0.50–1.35)
GFR calc Af Amer: >90 mL/min (ref >90)
GFR calc non Af Amer: >90 mL/min (ref >90)
Glucose, Bld: 96 mg/dL (ref 70–99)
Potassium: 4.1 mEq/L (ref 3.5–5.1)
Sodium: 140 mEq/L (ref 135–145)
Total Bilirubin: 0.4 mg/dL (ref 0.3–1.2)
Total Protein: 7.3 g/dL (ref 6.0–8.3)

## 2011-12-20 LAB — URINALYSIS, ROUTINE W REFLEX MICROSCOPIC
Bilirubin Urine: NEGATIVE
Glucose, UA: NEGATIVE mg/dL
Hgb urine dipstick: NEGATIVE
Ketones, ur: NEGATIVE mg/dL
Leukocytes, UA: NEGATIVE
Nitrite: NEGATIVE
Protein, ur: NEGATIVE mg/dL
Specific Gravity, Urine: 1.008 (ref 1.005–1.030)
Urobilinogen, UA: 0.2 mg/dL (ref 0.0–1.0)
pH: 7.5 (ref 5.0–8.0)

## 2011-12-20 LAB — CBC
HCT: 45.2 % (ref 39.0–52.0)
Hemoglobin: 16 g/dL (ref 13.0–17.0)
MCH: 30.4 pg (ref 26.0–34.0)
MCHC: 35.4 g/dL (ref 30.0–36.0)
MCV: 85.9 fL (ref 78.0–100.0)
Platelets: 242 10*3/uL (ref 150–400)
RBC: 5.26 MIL/uL (ref 4.22–5.81)
RDW: 13 % (ref 11.5–15.5)
WBC: 6.1 10*3/uL (ref 4.0–10.5)

## 2011-12-20 LAB — SURGICAL PCR SCREEN
MRSA, PCR: NEGATIVE
Staphylococcus aureus: NEGATIVE

## 2011-12-20 LAB — PROTIME-INR
INR: 1.01 (ref 0.00–1.49)
Prothrombin Time: 13.2 seconds (ref 11.6–15.2)

## 2011-12-20 NOTE — Patient Instructions (Addendum)
20 SHOJI PERTUIT  12/20/2011   Your procedure is scheduled on: 10-23  -2013  Report to [facility] at     0900   AM.  Call this number if you have problems the morning of surgery: [phone]  Or Presurgical Testing [phone](Tieler Cournoyer)   Remember:   Do not eat food:After Midnight.   Take these medicines the morning of surgery with A SIP OF WATER: Hydrocodone   Do not wear jewelry, make-up or nail polish.  Do not wear lotions, powders, or perfumes. You may wear deodorant.  Do not shave 48 hours prior to surgery.(face and neck okay, no shaving of legs)  Do not bring valuables to the hospital.  Contacts, dentures or bridgework may not be worn into surgery.  Leave suitcase in the car. After surgery it may be brought to your room.  For patients admitted to the hospital, checkout time is 11:00 AM the day of discharge.   Patients discharged the day of surgery will not be allowed to drive home. Must have responsible person with you x 24 hours once discharged.  Name and phone number of your driver: spouse-transport only [phone] cell(Talena Renovato  Special Instructions: CHG Shower Use Special Wash: see special instruction sheet.(avoid face and genitals)   Please read over the following fact sheets that you were given: MRSA Information, Blood Transfusion fact sheet, Incentive Spirometry Instruction.

## 2011-12-24 NOTE — H&P (Signed)
TOTAL HIP ADMISSION H&P  Patient is admitted for right total hip arthroplasty.  Subjective:  Chief Complaint: right hip pain  HPI: [name], 51 y.o. male, has a history of pain and functional disability in the right hip(s) due to arthritis and patient has failed non-surgical conservative treatments for greater than 12 weeks to include NSAID's and/or analgesics, corticosteriod injections, flexibility and strengthening excercises and activity modification.  Onset of symptoms was gradual starting 4 years ago with gradually worsening course since that time.The patient noted no past surgery on the right hip(s).  Patient currently rates pain in the right hip at 7 out of 10 with activity. Patient has night pain, worsening of pain with activity and weight bearing, pain that interfers with activities of daily living and pain with passive range of motion. Patient has evidence of subchondral cysts and joint space narrowing by imaging studies. This condition presents safety issues increasing the risk of falls. There is no current active infection.  Patient Active Problem List   Diagnosis Date Noted  . Prostate cancer 01/23/2011  . IRRITABLE BOWEL SYNDROME 08/24/2009  . LOW BACK PAIN 01/11/2009  . ACUTE BRONCHITIS 01/21/2008  . BREAST PAIN 01/21/2008  . OSTEOPENIA 10/07/2007  . TESTOSTERONE DEFICIENCY 05/16/2007  . HYPERLIPIDEMIA 05/16/2007  . OSTEOPOROSIS 05/16/2007  . DEPRESSION 02/13/2007  . OTHER ACUTE SINUSITIS 02/13/2007  . GERD 02/13/2007  . OSTEOARTHRITIS 02/13/2007  . 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 COLONIC POLYPS, HX OF 02/13/2007   Past Medical History  Diagnosis Date  . Colon polyps   . Depression   . GERD (gastroesophageal reflux disease)   . Arthritis   . Osteopenia   . Low back pain   . Cancer     10'13 prostate    Past Surgical History  Procedure Date  . Cystectomy     benign, right hip  . Laminectomy 2/09    lumbar  . Gynecomastia excision     bilateral,  reduction  . Lumbar microdiscectomy 10/15/08    L-4-5, per Dr. Donette Larry at [facility]  . Prostate surgery 12-13-10    robotic prostatectomy per Dr. Isabel Caprice   . Joint replacement 12-20-11    hx. LTHA, now RTHA planned  . Total hip arthroplasty 12-20-11    2010 left hip    No prescriptions prior to admission   Allergies  Allergen Reactions  . Oxycodone     Foggy thinking    History  Substance Use Topics  . Smoking status: Never Smoker   . Smokeless tobacco: Never Used  . Alcohol Use: Yes     rare-once a month    Family History  Problem Relation Age of Onset  . Arthritis    . Hypertension       Review of Systems  Constitutional: Negative.   HENT: Negative.  Negative for neck pain.   Eyes: Negative.   Respiratory: Negative.   Cardiovascular: Negative.   Gastrointestinal: Negative.   Genitourinary: Negative.   Musculoskeletal: Positive for back pain and joint pain. Negative for myalgias and falls.       Right hip pain  Skin: Negative.   Neurological: Negative.   Endo/Heme/Allergies: Negative.   Psychiatric/Behavioral: Negative.     Objective:  Physical Exam  Constitutional: He is oriented to person, place, and time. He appears well-developed and well-nourished. No distress.  HENT:  Head: Normocephalic and atraumatic.  Right Ear: External ear normal.  Left Ear: External ear normal.  Mouth/Throat: Oropharynx is clear and moist. No oropharyngeal  exudate.  Eyes: Conjunctivae normal and EOM are normal.  Neck: Normal range of motion. Neck supple. No thyromegaly present.  Cardiovascular: Normal rate, regular rhythm, normal heart sounds and intact distal pulses.   No murmur heard. Respiratory: Effort normal and breath sounds normal. No respiratory distress. He has no wheezes. He exhibits no tenderness.  GI: Soft. Bowel sounds are normal. He exhibits no distension and no mass. There is no tenderness.  Musculoskeletal:       Right hip: Normal. He exhibits  normal range of motion.       Left hip: Normal.       Right knee: Normal.       Left knee: Normal.       Legs: Lymphadenopathy:    He has no cervical adenopathy.  Neurological: He is alert and oriented to person, place, and time. He has normal strength. A sensory deficit is present.       Decreased sensation along the L5 nerve root in the left leg  Skin: No rash noted. He is not diaphoretic. No erythema.  Psychiatric: He has a normal mood and affect.      Estimated Body mass index is 34.93 kg/(m^2) as calculated from the following:   Height as of 01/17/09: 5' 9.5"(1.765 m).   Weight as of 03/01/11: 240 lb(108.863 kg).   Imaging Review Plain radiographs demonstrate severe degenerative joint disease of the right hip(s). The bone quality appears to be good for age and reported activity level.  Assessment/Plan:  End stage arthritis, right hip(s)  The patient history, physical examination, clinical judgement of the provider and imaging studies are consistent with end stage degenerative joint disease of the right hip(s) and total hip arthroplasty is deemed medically necessary. The treatment options including medical management, injection therapy, arthroscopy and arthroplasty were discussed at length. The risks and benefits of total hip arthroplasty were presented and reviewed. The risks due to aseptic loosening, infection, stiffness, dislocation/subluxation,  thromboembolic complications and other imponderables were discussed.  The patient acknowledged the explanation, agreed to proceed with the plan and consent was signed. Patient is being admitted for inpatient treatment for surgery, pain control, PT, OT, prophylactic antibiotics, VTE prophylaxis, progressive ambulation and ADL's and discharge planning.The patient is planning to be discharged home with home health services     [place], [place]

## 2011-12-26 ENCOUNTER — Encounter (HOSPITAL_COMMUNITY)
Admission: RE | Disposition: A | Discharge status: Home Health | Payer: Self-pay | Source: Ambulatory Visit | Attending: Orthopedic Surgery

## 2011-12-26 ENCOUNTER — Encounter (HOSPITAL_COMMUNITY): Payer: Self-pay | Admitting: *Deleted

## 2011-12-26 ENCOUNTER — Encounter (HOSPITAL_COMMUNITY): Payer: Self-pay | Admitting: Anesthesiology

## 2011-12-26 ENCOUNTER — Inpatient Hospital Stay (HOSPITAL_COMMUNITY)
Admission: RE | Admit: 2011-12-26 | Discharge: 2011-12-28 | DRG: 818 | Disposition: A | Discharge status: Home Health | Payer: BC Managed Care – PPO | Source: Ambulatory Visit | Attending: Orthopedic Surgery | Admitting: Orthopedic Surgery

## 2011-12-26 ENCOUNTER — Ambulatory Visit (HOSPITAL_COMMUNITY): Payer: BC Managed Care – PPO | Admitting: Anesthesiology

## 2011-12-26 ENCOUNTER — Ambulatory Visit (HOSPITAL_COMMUNITY): Payer: BC Managed Care – PPO

## 2011-12-26 DIAGNOSIS — M899 Disorder of bone, unspecified: Secondary | ICD-10-CM | POA: Diagnosis present

## 2011-12-26 DIAGNOSIS — M1610 Unilateral primary osteoarthritis, unspecified hip: Principal | ICD-10-CM | POA: Diagnosis present

## 2011-12-26 DIAGNOSIS — Z79899 Other long term (current) drug therapy: Secondary | ICD-10-CM

## 2011-12-26 DIAGNOSIS — F329 Major depressive disorder, single episode, unspecified: Secondary | ICD-10-CM | POA: Diagnosis present

## 2011-12-26 DIAGNOSIS — E785 Hyperlipidemia, unspecified: Secondary | ICD-10-CM | POA: Diagnosis present

## 2011-12-26 DIAGNOSIS — C61 Malignant neoplasm of prostate: Secondary | ICD-10-CM | POA: Diagnosis present

## 2011-12-26 DIAGNOSIS — Z860100 Personal history of colon polyps, unspecified: Secondary | ICD-10-CM

## 2011-12-26 DIAGNOSIS — F3289 Other specified depressive episodes: Secondary | ICD-10-CM | POA: Diagnosis present

## 2011-12-26 DIAGNOSIS — M169 Osteoarthritis of hip, unspecified: Secondary | ICD-10-CM | POA: Diagnosis present

## 2011-12-26 DIAGNOSIS — K219 Gastro-esophageal reflux disease without esophagitis: Secondary | ICD-10-CM | POA: Diagnosis present

## 2011-12-26 DIAGNOSIS — Z96649 Presence of unspecified artificial hip joint: Secondary | ICD-10-CM

## 2011-12-26 HISTORY — PX: TOTAL HIP ARTHROPLASTY: SHX124

## 2011-12-26 LAB — TYPE AND SCREEN
ABO/RH(D): A POS
Antibody Screen: NEGATIVE

## 2011-12-26 SURGERY — ARTHROPLASTY, HIP, TOTAL,POSTERIOR APPROACH
Anesthesia: General | Site: Hip | Laterality: Right | Wound class: Clean

## 2011-12-26 MED ORDER — HYDROMORPHONE HCL PF 1 MG/ML IJ SOLN: 0.2500 mg | INTRAMUSCULAR | Status: DC | PRN

## 2011-12-26 MED ORDER — TRAMADOL HCL 50 MG PO TABS: 50.0000 mg | ORAL_TABLET | Freq: Four times a day (QID) | ORAL | Status: DC | PRN

## 2011-12-26 MED ORDER — RIVAROXABAN 10 MG PO TABS
10.0000 mg | ORAL_TABLET | Freq: Every day | ORAL | Status: DC
  Administered 2011-12-27 – 2011-12-28 (×2): 10 mg via ORAL
  Filled 2011-12-26 (×3): qty 1

## 2011-12-26 MED ORDER — MENTHOL 3 MG MT LOZG
1.0000 | LOZENGE | OROMUCOSAL | Status: DC | PRN
  Administered 2011-12-27 – 2011-12-28 (×2): 3 mg via ORAL
  Filled 2011-12-26 (×2): qty 9

## 2011-12-26 MED ORDER — DOCUSATE SODIUM 100 MG PO CAPS
100.0000 mg | ORAL_CAPSULE | Freq: Two times a day (BID) | ORAL | Status: DC
  Administered 2011-12-26 – 2011-12-28 (×4): 100 mg via ORAL

## 2011-12-26 MED ORDER — DEXAMETHASONE 6 MG PO TABS
10.0000 mg | ORAL_TABLET | Freq: Once | ORAL | Status: AC
  Administered 2011-12-27: 10 mg via ORAL
  Filled 2011-12-26: qty 1

## 2011-12-26 MED ORDER — METOCLOPRAMIDE HCL 5 MG/ML IJ SOLN: 5.0000 mg | Freq: Three times a day (TID) | INTRAMUSCULAR | Status: DC | PRN

## 2011-12-26 MED ORDER — OXYCODONE HCL 5 MG PO TABS: 5.0000 mg | ORAL_TABLET | Freq: Once | ORAL | Status: DC | PRN

## 2011-12-26 MED ORDER — DEXTROSE-NACL 5-0.9 % IV SOLN
INTRAVENOUS | Status: DC
  Administered 2011-12-26 (×2): via INTRAVENOUS

## 2011-12-26 MED ORDER — NEOSTIGMINE METHYLSULFATE 1 MG/ML IJ SOLN
INTRAMUSCULAR | Status: DC | PRN
  Administered 2011-12-26: 3 mg via INTRAVENOUS

## 2011-12-26 MED ORDER — ONDANSETRON HCL 4 MG PO TABS: 4.0000 mg | ORAL_TABLET | Freq: Four times a day (QID) | ORAL | Status: DC | PRN

## 2011-12-26 MED ORDER — LACTATED RINGERS IV SOLN
INTRAVENOUS | Status: DC
  Administered 2011-12-26: 13:00:00 via INTRAVENOUS
  Administered 2011-12-26: 1000 mL via INTRAVENOUS
  Administered 2011-12-26: 12:00:00 via INTRAVENOUS

## 2011-12-26 MED ORDER — CHLORHEXIDINE GLUCONATE 4 % EX LIQD
60.0000 mL | Freq: Once | CUTANEOUS | Status: DC
  Filled 2011-12-26: qty 60

## 2011-12-26 MED ORDER — CEFAZOLIN SODIUM-DEXTROSE 2-3 GM-% IV SOLR
INTRAVENOUS | Status: AC
  Filled 2011-12-26: qty 50

## 2011-12-26 MED ORDER — ACETAMINOPHEN 10 MG/ML IV SOLN
INTRAVENOUS | Status: DC | PRN
  Administered 2011-12-26: 1000 mg via INTRAVENOUS

## 2011-12-26 MED ORDER — OXYCODONE HCL 5 MG/5ML PO SOLN
5.0000 mg | Freq: Once | ORAL | Status: DC | PRN
  Filled 2011-12-26: qty 5

## 2011-12-26 MED ORDER — POLYETHYLENE GLYCOL 3350 17 G PO PACK: 17.0000 g | PACK | Freq: Every day | ORAL | Status: DC | PRN

## 2011-12-26 MED ORDER — BISACODYL 10 MG RE SUPP
10.0000 mg | Freq: Every day | RECTAL | Status: DC | PRN
Start: 2011-12-26 — End: 2011-12-28

## 2011-12-26 MED ORDER — ACETAMINOPHEN 10 MG/ML IV SOLN
1000.0000 mg | Freq: Four times a day (QID) | INTRAVENOUS | Status: AC
  Administered 2011-12-26 – 2011-12-27 (×4): 1000 mg via INTRAVENOUS
  Filled 2011-12-26 (×6): qty 100

## 2011-12-26 MED ORDER — DEXAMETHASONE SODIUM PHOSPHATE 10 MG/ML IJ SOLN
INTRAMUSCULAR | Status: DC | PRN
  Administered 2011-12-26: 10 mg via INTRAVENOUS

## 2011-12-26 MED ORDER — DEXTROSE 5 % IV SOLN
500.0000 mg | Freq: Four times a day (QID) | INTRAVENOUS | Status: DC | PRN
  Filled 2011-12-26: qty 5

## 2011-12-26 MED ORDER — HYDROMORPHONE HCL 2 MG PO TABS
2.0000 mg | ORAL_TABLET | ORAL | Status: DC | PRN
  Administered 2011-12-26 (×2): 2 mg via ORAL
  Administered 2011-12-26 – 2011-12-28 (×6): 4 mg via ORAL
  Filled 2011-12-26 (×3): qty 2
  Filled 2011-12-26: qty 1
  Filled 2011-12-26 (×2): qty 2
  Filled 2011-12-26: qty 1
  Filled 2011-12-26: qty 2

## 2011-12-26 MED ORDER — GLYCOPYRROLATE 0.2 MG/ML IJ SOLN
INTRAMUSCULAR | Status: DC | PRN
  Administered 2011-12-26: .5 mg via INTRAVENOUS

## 2011-12-26 MED ORDER — BUPIVACAINE LIPOSOME 1.3 % IJ SUSP
20.0000 mL | INTRAMUSCULAR | Status: AC
  Administered 2011-12-26: 70 mL
  Filled 2011-12-26: qty 20

## 2011-12-26 MED ORDER — KETAMINE HCL 10 MG/ML IJ SOLN
INTRAMUSCULAR | Status: DC | PRN
  Administered 2011-12-26: 30 mg via INTRAVENOUS

## 2011-12-26 MED ORDER — CISATRACURIUM BESYLATE (PF) 10 MG/5ML IV SOLN
INTRAVENOUS | Status: DC | PRN
  Administered 2011-12-26: 8 mg via INTRAVENOUS

## 2011-12-26 MED ORDER — DEXAMETHASONE SODIUM PHOSPHATE 10 MG/ML IJ SOLN
10.0000 mg | Freq: Once | INTRAMUSCULAR | Status: AC
  Filled 2011-12-26: qty 1

## 2011-12-26 MED ORDER — PHENOL 1.4 % MT LIQD: 1.0000 | OROMUCOSAL | Status: DC | PRN

## 2011-12-26 MED ORDER — MORPHINE SULFATE 2 MG/ML IJ SOLN: 1.0000 mg | INTRAMUSCULAR | Status: DC | PRN

## 2011-12-26 MED ORDER — 0.9 % SODIUM CHLORIDE (POUR BTL) OPTIME
TOPICAL | Status: DC | PRN
  Administered 2011-12-26: 1000 mL

## 2011-12-26 MED ORDER — METOCLOPRAMIDE HCL 10 MG PO TABS: 5.0000 mg | ORAL_TABLET | Freq: Three times a day (TID) | ORAL | Status: DC | PRN

## 2011-12-26 MED ORDER — CEFAZOLIN SODIUM-DEXTROSE 2-3 GM-% IV SOLR
2.0000 g | Freq: Four times a day (QID) | INTRAVENOUS | Status: AC
  Administered 2011-12-26 – 2011-12-27 (×2): 2 g via INTRAVENOUS
  Filled 2011-12-26 (×2): qty 50

## 2011-12-26 MED ORDER — FLEET ENEMA 7-19 GM/118ML RE ENEM: 1.0000 | ENEMA | Freq: Once | RECTAL | Status: AC | PRN

## 2011-12-26 MED ORDER — MIDAZOLAM HCL 5 MG/5ML IJ SOLN
INTRAMUSCULAR | Status: DC | PRN
  Administered 2011-12-26: 2 mg via INTRAVENOUS

## 2011-12-26 MED ORDER — PROMETHAZINE HCL 25 MG/ML IJ SOLN: 6.2500 mg | INTRAMUSCULAR | Status: DC | PRN

## 2011-12-26 MED ORDER — TEMAZEPAM 15 MG PO CAPS: 15.0000 mg | ORAL_CAPSULE | Freq: Every evening | ORAL | Status: DC | PRN

## 2011-12-26 MED ORDER — STERILE WATER FOR IRRIGATION IR SOLN
Status: DC | PRN
  Administered 2011-12-26: 3000 mL

## 2011-12-26 MED ORDER — SUCCINYLCHOLINE CHLORIDE 20 MG/ML IJ SOLN
INTRAMUSCULAR | Status: DC | PRN
  Administered 2011-12-26: 100 mg via INTRAVENOUS

## 2011-12-26 MED ORDER — MEPERIDINE HCL 50 MG/ML IJ SOLN: 6.2500 mg | INTRAMUSCULAR | Status: DC | PRN

## 2011-12-26 MED ORDER — LIDOCAINE HCL (CARDIAC) 20 MG/ML IV SOLN
INTRAVENOUS | Status: DC | PRN
  Administered 2011-12-26: 100 mg via INTRAVENOUS

## 2011-12-26 MED ORDER — DIPHENHYDRAMINE HCL 12.5 MG/5ML PO ELIX: 12.5000 mg | ORAL_SOLUTION | ORAL | Status: DC | PRN

## 2011-12-26 MED ORDER — ACETAMINOPHEN 650 MG RE SUPP: 650.0000 mg | Freq: Four times a day (QID) | RECTAL | Status: DC | PRN

## 2011-12-26 MED ORDER — SODIUM CHLORIDE 0.9 % IV SOLN: INTRAVENOUS | Status: DC

## 2011-12-26 MED ORDER — ACETAMINOPHEN 10 MG/ML IV SOLN: 1000.0000 mg | Freq: Once | INTRAVENOUS | Status: DC

## 2011-12-26 MED ORDER — ACETAMINOPHEN 10 MG/ML IV SOLN
INTRAVENOUS | Status: AC
  Filled 2011-12-26: qty 100

## 2011-12-26 MED ORDER — ACETAMINOPHEN 10 MG/ML IV SOLN: 1000.0000 mg | Freq: Once | INTRAVENOUS | Status: DC | PRN

## 2011-12-26 MED ORDER — ACETAMINOPHEN 325 MG PO TABS: 650.0000 mg | ORAL_TABLET | Freq: Four times a day (QID) | ORAL | Status: DC | PRN

## 2011-12-26 MED ORDER — ONDANSETRON HCL 4 MG/2ML IJ SOLN: 4.0000 mg | Freq: Four times a day (QID) | INTRAMUSCULAR | Status: DC | PRN

## 2011-12-26 MED ORDER — METHOCARBAMOL 500 MG PO TABS
500.0000 mg | ORAL_TABLET | Freq: Four times a day (QID) | ORAL | Status: DC | PRN
  Administered 2011-12-26 – 2011-12-27 (×2): 500 mg via ORAL
  Filled 2011-12-26 (×3): qty 1

## 2011-12-26 MED ORDER — PROPOFOL 10 MG/ML IV BOLUS
INTRAVENOUS | Status: DC | PRN
  Administered 2011-12-26: 200 mg via INTRAVENOUS

## 2011-12-26 MED ORDER — ONDANSETRON HCL 4 MG/2ML IJ SOLN
INTRAMUSCULAR | Status: DC | PRN
  Administered 2011-12-26: 4 mg via INTRAVENOUS

## 2011-12-26 MED ORDER — EPHEDRINE SULFATE 50 MG/ML IJ SOLN
INTRAMUSCULAR | Status: DC | PRN
  Administered 2011-12-26: 10 mg via INTRAVENOUS

## 2011-12-26 MED ORDER — CEFAZOLIN SODIUM-DEXTROSE 2-3 GM-% IV SOLR
2.0000 g | INTRAVENOUS | Status: AC
  Administered 2011-12-26: 2 g via INTRAVENOUS

## 2011-12-26 MED ORDER — SUFENTANIL CITRATE 50 MCG/ML IV SOLN
INTRAVENOUS | Status: DC | PRN
  Administered 2011-12-26: 10 ug via INTRAVENOUS
  Administered 2011-12-26: 30 ug via INTRAVENOUS
  Administered 2011-12-26: 10 ug via INTRAVENOUS

## 2011-12-26 SURGICAL SUPPLY — 53 items
BAG SPEC THK2 15X12 ZIP CLS (MISCELLANEOUS) ×1
BAG ZIPLOCK 12X15 (MISCELLANEOUS) ×2 IMPLANT
BIT DRILL 2.8X128 (BIT) ×2 IMPLANT
BLADE EXTENDED COATED 6.5IN (ELECTRODE) ×2 IMPLANT
BLADE SAW SAG 73X25 THK (BLADE) ×1
BLADE SAW SGTL 73X25 THK (BLADE) ×1 IMPLANT
CLOTH BEACON ORANGE TIMEOUT ST (SAFETY) ×2 IMPLANT
DECANTER SPIKE VIAL GLASS SM (MISCELLANEOUS) ×2 IMPLANT
DRAPE INCISE IOBAN 66X45 STRL (DRAPES) ×2 IMPLANT
DRAPE ORTHO SPLIT 77X108 STRL (DRAPES) ×4
DRAPE POUCH INSTRU U-SHP 10X18 (DRAPES) ×2 IMPLANT
DRAPE SURG ORHT 6 SPLT 77X108 (DRAPES) ×2 IMPLANT
DRAPE U-SHAPE 47X51 STRL (DRAPES) ×2 IMPLANT
DRSG ADAPTIC 3X8 NADH LF (GAUZE/BANDAGES/DRESSINGS) ×2 IMPLANT
DRSG MEPILEX BORDER 4X12 (GAUZE/BANDAGES/DRESSINGS) ×1 IMPLANT
DRSG MEPILEX BORDER 4X4 (GAUZE/BANDAGES/DRESSINGS) ×2 IMPLANT
DRSG MEPILEX BORDER 4X8 (GAUZE/BANDAGES/DRESSINGS) ×2 IMPLANT
DURAPREP 26ML APPLICATOR (WOUND CARE) ×2 IMPLANT
ELECT REM PT RETURN 9FT ADLT (ELECTROSURGICAL) ×2
ELECTRODE REM PT RTRN 9FT ADLT (ELECTROSURGICAL) ×1 IMPLANT
EVACUATOR 1/8 PVC DRAIN (DRAIN) ×2 IMPLANT
FACESHIELD LNG OPTICON STERILE (SAFETY) ×8 IMPLANT
GLOVE BIO SURGEON STRL SZ8 (GLOVE) ×2 IMPLANT
GLOVE BIOGEL PI IND STRL 8 (GLOVE) ×2 IMPLANT
GLOVE BIOGEL PI INDICATOR 8 (GLOVE) ×2
GLOVE ECLIPSE 8.0 STRL XLNG CF (GLOVE) ×2 IMPLANT
GLOVE SURG SS PI 6.5 STRL IVOR (GLOVE) ×4 IMPLANT
GOWN STRL NON-REIN LRG LVL3 (GOWN DISPOSABLE) ×4 IMPLANT
GOWN STRL REIN XL XLG (GOWN DISPOSABLE) ×2 IMPLANT
IMMOBILIZER KNEE 20 (SOFTGOODS) ×4
IMMOBILIZER KNEE 20 THIGH 36 (SOFTGOODS) IMPLANT
KIT BASIN OR (CUSTOM PROCEDURE TRAY) ×2 IMPLANT
MANIFOLD NEPTUNE II (INSTRUMENTS) ×2 IMPLANT
NDL SAFETY ECLIPSE 18X1.5 (NEEDLE) ×1 IMPLANT
NEEDLE HYPO 18GX1.5 SHARP (NEEDLE) ×2
NS IRRIG 1000ML POUR BTL (IV SOLUTION) ×1 IMPLANT
PACK TOTAL JOINT (CUSTOM PROCEDURE TRAY) ×2 IMPLANT
PASSER SUT SWANSON 36MM LOOP (INSTRUMENTS) ×2 IMPLANT
POSITIONER SURGICAL ARM (MISCELLANEOUS) ×2 IMPLANT
SPONGE GAUZE 4X4 12PLY (GAUZE/BANDAGES/DRESSINGS) ×2 IMPLANT
SROM FEMOR STEM HIP STD 18X13 (Hips) ×2 IMPLANT
STEM HIP FMRL SROM STD 18X13 (Hips) IMPLANT
STRIP CLOSURE SKIN 1/2X4 (GAUZE/BANDAGES/DRESSINGS) ×4 IMPLANT
SUT ETHIBOND NAB CT1 #1 30IN (SUTURE) ×4 IMPLANT
SUT MNCRL AB 4-0 PS2 18 (SUTURE) ×2 IMPLANT
SUT VIC AB 2-0 CT1 27 (SUTURE) ×6
SUT VIC AB 2-0 CT1 TAPERPNT 27 (SUTURE) ×3 IMPLANT
SUT VLOC 180 0 24IN GS25 (SUTURE) ×4 IMPLANT
SYR 50ML LL SCALE MARK (SYRINGE) ×2 IMPLANT
TOWEL OR 17X26 10 PK STRL BLUE (TOWEL DISPOSABLE) ×4 IMPLANT
TOWEL OR NON WOVEN STRL DISP B (DISPOSABLE) ×2 IMPLANT
TRAY FOLEY CATH 14FRSI W/METER (CATHETERS) ×2 IMPLANT
WATER STERILE IRR 1500ML POUR (IV SOLUTION) ×1 IMPLANT

## 2011-12-26 NOTE — Preoperative (Signed)
Beta Blockers   Reason not to administer Beta Blockers:Not Applicable 

## 2011-12-26 NOTE — Interval H&P Note (Signed)
History and Physical Interval Note:  12/26/2011 11:04 AM  [name]  has presented today for surgery, with the diagnosis of Osteoarthritis of the Right Hip  The various methods of treatment have been discussed with the patient and family. After consideration of risks, benefits and other options for treatment, the patient has consented to  Procedure(s) (LRB) with comments: TOTAL HIP ARTHROPLASTY (Right) as a surgical intervention .  The patient's history has been reviewed, patient examined, no change in status, stable for surgery.  I have reviewed the patient's chart and labs.  Questions were answered to the patient's satisfaction.     Loanne Drilling

## 2011-12-26 NOTE — Evaluation (Signed)
Physical Therapy Evaluation Patient Details Name: [name] MRN: [id number] DOB: 07-04-1960 Today's Date: 12/26/2011 Time: 7829-5621 PT Time Calculation (min): 32 min  PT Assessment / Plan / Recommendation Clinical Impression  Pt s/p RTHA posterior today and was able to ambulate 150 ft today. Pt will benefit from PT to expedite DC to home.    PT Assessment  Patient needs continued PT services    Follow Up Recommendations  Home health PT    Does the patient have the potential to tolerate intense rehabilitation      Barriers to Discharge        Equipment Recommendations  None recommended by PT    Recommendations for Other Services     Frequency 7X/week    Precautions / Restrictions Precautions Precautions: Posterior Hip Precaution Comments: pt able to state Restrictions Weight Bearing Restrictions: No Other Position/Activity Restrictions: WBAT R LE   Pertinent Vitals/Pain 7/10 RN brought meds and ice placed to r hip      Mobility  Bed Mobility Bed Mobility: Supine to Sit;Sit to Supine Supine to Sit: 4: Min assist Sit to Supine: 4: Min assist Details for Bed Mobility Assistance: assistance for RLE onto bed.  Transfers Transfers: Sit to Stand;Stand to Sit Sit to Stand: 4: Min guard;From chair/3-in-1;From bed Stand to Sit: 4: Min guard;To chair/3-in-1;To bed Details for Transfer Assistance: verbal cues for hand placement and RLE position, precautions. Ambulation/Gait Ambulation/Gait Assistance: 4: Min guard Ambulation Distance (Feet): 150 Feet (after 15 ft.) Assistive device: Rolling walker Ambulation/Gait Assistance Details: verbal cues to turn and keep R leg in neutral . Gait Pattern: Step-through pattern;Antalgic;Decreased step length - right General Gait Details: pt was barely placing weight onto  RW with UE's    Shoulder Instructions     Exercises Total Joint Exercises Quad Sets: AROM;Right;10 reps Heel Slides: AAROM;Right;10 reps Hip  ABduction/ADduction: AAROM;Right;10 reps   PT Diagnosis: Difficulty walking;Acute pain  PT Problem List: Decreased strength;Decreased range of motion;Decreased activity tolerance;Decreased mobility;Decreased knowledge of precautions;Pain PT Treatment Interventions: DME instruction;Gait training;Stair training;Functional mobility training;Therapeutic activities;Patient/family education;Therapeutic exercise   PT Goals Acute Rehab PT Goals PT Goal Formulation: With patient Time For Goal Achievement: 01/02/12 Potential to Achieve Goals: Good Pt will go Supine/Side to Sit: with modified independence PT Goal: Supine/Side to Sit - Progress: Goal set today Pt will go Sit to Supine/Side: with modified independence PT Goal: Sit to Supine/Side - Progress: Goal set today Pt will go Sit to Stand: with supervision PT Goal: Sit to Stand - Progress: Goal set today Pt will go Stand to Sit: with supervision PT Goal: Stand to Sit - Progress: Goal set today Pt will Ambulate: >150 feet;with supervision;with rolling walker PT Goal: Ambulate - Progress: Goal set today Pt will Go Up / Down Stairs: 1-2 stairs;with supervision;with rolling walker PT Goal: Up/Down Stairs - Progress: Goal set today Pt will Perform Home Exercise Program: with supervision, verbal cues required/provided PT Goal: Perform Home Exercise Program - Progress: Goal set today  Visit Information  Last PT Received On: 12/26/11 Assistance Needed: +1    Subjective Data  Subjective: I am ready  to walk Patient Stated Goal: to go home   Prior Functioning  Home Living Lives With: Spouse Available Help at Discharge: Family Type of Home: House Home Access: Stairs to enter Secretary/administrator of Steps: 1 Home Layout: One level Bathroom Shower/Tub: Health visitor: Handicapped height Home Adaptive Equipment: Walker - rolling Prior Function Level of Independence: Independent Able to Take Stairs?: Yes  Vocation: Full  time employment Communication Communication: No difficulties    Cognition  Overall Cognitive Status: Appears within functional limits for tasks assessed/performed Arousal/Alertness: Awake/alert Orientation Level: Appears intact for tasks assessed Behavior During Session: [facility] for tasks performed    Extremity/Trunk Assessment Right Upper Extremity Assessment RUE ROM/Strength/Tone: Within functional levels Left Upper Extremity Assessment LUE ROM/Strength/Tone: Within functional levels LUE Sensation: Deficits Right Lower Extremity Assessment RLE ROM/Strength/Tone: Deficits RLE ROM/Strength/Tone Deficits: pt needs assistance to flex hip in supine. pt able to advance RLE while walking RLE Sensation: Deficits RLE Sensation Deficits: states h/o decreased sensation R foot since back surgery Left Lower Extremity Assessment LLE ROM/Strength/Tone: Within functional levels   Balance    End of Session PT - End of Session Activity Tolerance: Patient tolerated treatment well Patient left: in bed;with call bell/phone within reach;with family/visitor present Nurse Communication: Mobility status  GP     Rada Hay 12/26/2011, 6:14 PM

## 2011-12-26 NOTE — Anesthesia Procedure Notes (Signed)
Procedure Name: Intubation Date/Time: 12/26/2011 12:00 PM Performed by: Leroy Libman L Patient Re-evaluated:Patient Re-evaluated prior to inductionOxygen Delivery Method: Circle system utilized Preoxygenation: Pre-oxygenation with 100% oxygen Intubation Type: IV induction Ventilation: Mask ventilation without difficulty and Oral airway inserted - appropriate to patient size Grade View: Grade I Tube type: Oral Tube size: 8.0 mm Number of attempts: 1 Airway Equipment and Method: Stylet Placement Confirmation: ETT inserted through vocal cords under direct vision,  breath sounds checked- equal and bilateral and positive ETCO2 Secured at: 22 cm Tube secured with: Tape Dental Injury: Teeth and Oropharynx as per pre-operative assessment

## 2011-12-26 NOTE — Transfer of Care (Signed)
Immediate Anesthesia Transfer of Care Note  Patient: [name]  Procedure(s) Performed: Procedure(s) (LRB) with comments: TOTAL HIP ARTHROPLASTY (Right)  Patient Location: PACU  Anesthesia Type: General  Level of Consciousness: sedated  Airway & Oxygen Therapy: Patient Spontanous Breathing and Patient connected to face mask oxygen  Post-op Assessment: Report given to PACU RN and Post -op Vital signs reviewed and stable  Post vital signs: Reviewed and stable  Complications: No apparent anesthesia complications

## 2011-12-26 NOTE — Anesthesia Postprocedure Evaluation (Signed)
Anesthesia Post Note  Patient: [name]  Procedure(s) Performed: Procedure(s) (LRB): TOTAL HIP ARTHROPLASTY (Right)  Anesthesia type: General  Patient location: PACU  Post pain: Pain level controlled  Post assessment: Post-op Vital signs reviewed  Last Vitals: BP 131/71  Pulse 72  Temp 36.7 C (Oral)  Resp 18  SpO2 100%  Post vital signs: Reviewed  Level of consciousness: sedated  Complications: No apparent anesthesia complications

## 2011-12-26 NOTE — Anesthesia Preprocedure Evaluation (Addendum)
Anesthesia Evaluation  Patient identified by MRN, date of birth, ID band Patient awake    Reviewed: Allergy & Precautions, H&P , NPO status , Patient's Chart, lab work & pertinent test results  Airway Mallampati: II TM Distance: >3 FB Neck ROM: Full    Dental  (+) Teeth Intact and Dental Advisory Given   Pulmonary neg pulmonary ROS,  breath sounds clear to auscultation  Pulmonary exam normal       Cardiovascular Exercise Tolerance: Good - CAD and - Past MI Rhythm:Regular Rate:Normal     Neuro/Psych PSYCHIATRIC DISORDERS Depression negative neurological ROS     GI/Hepatic Neg liver ROS, GERD-  ,  Endo/Other  negative endocrine ROS  Renal/GU negative Renal ROS     Musculoskeletal  (+) Arthritis -, Osteoarthritis,    Abdominal   Peds  Hematology negative hematology ROS (+)   Anesthesia Other Findings   Reproductive/Obstetrics                           Anesthesia Physical Anesthesia Plan  ASA: I  Anesthesia Plan: General   Post-op Pain Management:    Induction: Intravenous  Airway Management Planned: Oral ETT  Additional Equipment:   Intra-op Plan:   Post-operative Plan: Extubation in OR  Informed Consent: I have reviewed the patients History and Physical, chart, labs and discussed the procedure including the risks, benefits and alternatives for the proposed anesthesia with the patient or authorized representative who has indicated his/her understanding and acceptance.   Dental advisory given  Plan Discussed with: CRNA  Anesthesia Plan Comments:         Anesthesia Quick Evaluation

## 2011-12-26 NOTE — Op Note (Signed)
Pre-operative diagnosis- Osteoarthritis Right hip  Post-operative diagnosis- Osteoarthritis  Right hip  Procedure-  RightTotal Hip Arthroplasty  Surgeon- Gus Rankin. Welden Hausmann, MD  Assistant- Leilani Able, PA-C   Anesthesia  General  EBL- 400   Drain Hemovac   Complication- None  Condition-PACU - hemodynamically stable.   Brief Clinical Note- [name] is a 51 y.o. male with end stage arthritis of his right hip with progressively worsening pain and dysfunction. Pain occurs with activity and rest including pain at night. He has tried analgesics, protected weight bearing and rest without benefit. Pain is too severe to attempt physical therapy. Radiographs demonstrate bone on bone arthritis with subchondral cyst formation. He presents now for right THA.  Procedure in detail-   The patient is brought into the operating room and placed on the operating table. After successful administration of General  anesthesia, the patient is placed in the  Left lateral decubitus position with the  Left side up and held in place with the hip positioner. The lower extremity is isolated from the perineum with plastic drapes and time-out is performed by the surgical team. The lower extremity is then prepped and draped in the usual sterile fashion. A short posterolateral incision is made with a ten blade through the subcutaneous tissue to the level of the fascia lata which is incised in line with the skin incision. The sciatic nerve is palpated and protected and the short external rotators and capsule are isolated from the femur. The hip is then dislocated and the center of the femoral head is marked. A trial prosthesis is placed such that the trial head corresponds to the center of the patients' native femoral head. The resection level is marked on the femoral neck and the resection is made with an oscillating saw. The femoral head is removed and femoral retractors placed to gain access to the femoral canal.   The canal finder is passed into the femoral canal and the canal is thoroughly irrigated with sterile saline to remove the fatty contents. Axial reaming is performed to 13.5  mm, proximal reaming to 19F  and the sleeve machined to a large. A 18 F large trial sleeve is placed into the proximal femur.      The femur is then retracted anteriorly to gain acetabular exposure. Acetabular retractors are placed and the labrum and osteophytes are removed, Acetabular reaming is performed to 57  mm and a 58  mm Pinnacle acetabular shell is placed in anatomic position with excellent purchase. Additional dome screws were placed. An apex hole eliminator is placed and the permanent 36 mm neutral + 4 Marathon liner is placed into the acetabular shell.      The trial femur is then placed into the femoral canal. The size is 18 x 13  stem with a 3 + 12  neck and a 36 + 9 head with the neck version matching  the patients' native anteversion. The hip is reduced with excellent stability with full extension and full external rotation, 70 degrees flexion with 40 degrees adduction and 90 degrees internal rotation and 90 degrees of flexion with 70 degrees of internal rotation. The operative leg is placed on top of the non-operative leg and the leg lengths are found to be equal. The trials are then removed and the permanent implant of the same size is impacted into the femoral canal. The  ceramic femoral head of the same size as the trial is placed and the hip is reduced with the  same stability parameters. The operative leg is again placed on top of the non-operative leg and the leg lengths are found to be equal.      The wound is then copiously irrigated with saline solution and the capsule and short external rotators are re-attached to the femur through drill holes with Ethibond suture. The fascia lata is closed over a hemovac drain with #1 vicryl suture and the fascia lata, gluteal muscles and subcutaneous tissues are injected with  Exparel 20ml diluted with saline 50ml. The subcutaneous tissues are closed with #1 and2-0 vicryl and the subcuticular layer closed with running 4-0 Monocryl. The drain is hooked to suction, incision cleaned and dried, and steri-srips and a bulky sterile dressing applied. The limb is placed into a knee immobilizer and the patient is awakened and transported to recovery in stable condition.      Please note that a surgical assistant was a medical necessity for this procedure in order to perform it in a safe and expeditious manner. The assistant was necessary to provide retraction to the vital neurovascular structures and to retract and position the limb to allow for anatomic placement of the prosthetic components.  Gus Rankin William Bedore, MD    12/26/2011, 1:20 PM

## 2011-12-27 ENCOUNTER — Encounter (HOSPITAL_COMMUNITY): Payer: Self-pay | Admitting: Orthopedic Surgery

## 2011-12-27 LAB — BASIC METABOLIC PANEL
BUN: 7 mg/dL (ref 6–23)
CO2: 27 mEq/L (ref 19–32)
Calcium: 8.9 mg/dL (ref 8.4–10.5)
Chloride: 101 mEq/L (ref 96–112)
Creatinine, Ser: 0.79 mg/dL (ref 0.50–1.35)
GFR calc Af Amer: >90 mL/min (ref >90)
GFR calc non Af Amer: >90 mL/min (ref >90)
Glucose, Bld: 146 mg/dL — ABNORMAL HIGH (ref 70–99)
Potassium: 3.9 mEq/L (ref 3.5–5.1)
Sodium: 137 mEq/L (ref 135–145)

## 2011-12-27 LAB — CBC
HCT: 39.2 % (ref 39.0–52.0)
Hemoglobin: 14 g/dL (ref 13.0–17.0)
MCH: 30.5 pg (ref 26.0–34.0)
MCHC: 35.7 g/dL (ref 30.0–36.0)
MCV: 85.4 fL (ref 78.0–100.0)
Platelets: 235 10*3/uL (ref 150–400)
RBC: 4.59 MIL/uL (ref 4.22–5.81)
RDW: 13 % (ref 11.5–15.5)
WBC: 12.9 10*3/uL — ABNORMAL HIGH (ref 4.0–10.5)

## 2011-12-27 MED ORDER — DEXTROSE-NACL 5-0.9 % IV SOLN
INTRAVENOUS | Status: AC
  Administered 2011-12-27: 08:00:00 via INTRAVENOUS

## 2011-12-27 MED ORDER — SODIUM CHLORIDE 0.9 % IV BOLUS (SEPSIS)
250.0000 mL | Freq: Once | INTRAVENOUS | Status: AC
  Administered 2011-12-27: 250 mL via INTRAVENOUS

## 2011-12-27 NOTE — Progress Notes (Signed)
   Subjective: 1 Day Post-Op Procedure(s) (LRB): TOTAL HIP ARTHROPLASTY (Right) Patient reports pain as mild.   Patient seen in rounds by Dr. Lequita Halt. Patient is well, and has had no acute complaints or problems We will start therapy today.  Plan is to go Home after hospital stay.  Objective: Vital signs in last 24 hours: Temp:  [97.9 F (36.6 C)-98.7 F (37.1 C)] 98.5 F (36.9 C) (10/24 0537) Pulse Rate:  [70-103] 83  (10/24 0537) Resp:  [14-18] 16  (10/24 0537) BP: (107-151)/(63-86) 118/67 mmHg (10/24 0537) SpO2:  [97 %-100 %] 99 % (10/24 0537) Weight:  [106.595 kg (235 lb)] 106.595 kg (235 lb) (10/23 1435)  Intake/Output from previous day:  Intake/Output Summary (Last 24 hours) at 12/27/11 0724 Last data filed at 12/27/11 0624  Gross per 24 hour  Intake 3520.67 ml  Output   7775 ml  Net -4254.33 ml    Intake/Output this shift:    Labs:  Basename 12/27/11 0459  HGB 14.0    Basename 12/27/11 0459  WBC 12.9*  RBC 4.59  HCT 39.2  PLT 235    Basename 12/27/11 0459  NA 137  K 3.9  CL 101  CO2 27  BUN 7  CREATININE 0.79  GLUCOSE 146*  CALCIUM 8.9   No results found for this basename: LABPT:2,INR:2 in the last 72 hours  EXAM General - Patient is Alert, Appropriate and Oriented Extremity - Neurovascular intact Sensation intact distally Dorsiflexion/Plantar flexion intact Dressing - dressing C/D/I Motor Function - intact, moving foot and toes well on exam.  Hemovac pulled without difficulty.  Past Medical History  Diagnosis Date  . Colon polyps   . Depression   . GERD (gastroesophageal reflux disease)   . Arthritis   . Osteopenia   . Low back pain   . Cancer     10'13 prostate    Assessment/Plan: 1 Day Post-Op Procedure(s) (LRB): TOTAL HIP ARTHROPLASTY (Right) Principal Problem:  *OA (osteoarthritis) of hip  Estimated Body mass index is 33.72 kg/(m^2) as calculated from the following:   Height as of this encounter: 5\' 10" (1.778 m).  Weight as of this encounter: 235 lb(106.595 kg). Advance diet Up with therapy Continue foley due to strict I&O  DVT Prophylaxis - Xarelto Weight Bearing As Tolerated right Leg D/C Knee Immobilizer Hemovac Pulled Begin Therapy Hip Preacutions No vaccines.  Latrel Szymczak 12/27/2011, 7:24 AM

## 2011-12-27 NOTE — Progress Notes (Signed)
Physical Therapy Treatment Patient Details Name: [name] MRN: [id number] DOB: [birth year] Today's Date: 12/27/2011 Time: 1191-4782 PT Time Calculation (min): 23 min  PT Assessment / Plan / Recommendation Comments on Treatment Session  Pt extremely motivated and progressing well    Follow Up Recommendations  Home health PT     Does the patient have the potential to tolerate intense rehabilitation     Barriers to Discharge        Equipment Recommendations  None recommended by OT    Recommendations for Other Services    Frequency 7X/week   Plan Discharge plan remains appropriate    Precautions / Restrictions Precautions Precautions: Posterior Hip Precaution Comments: Pt able to state 3/3 hip precautions. Restrictions Weight Bearing Restrictions: No Other Position/Activity Restrictions: WBAT R LE   Pertinent Vitals/Pain Pt reports 1/10 pain with activity    Mobility  Bed Mobility Bed Mobility: Sit to Supine Sit to Supine: 5: Supervision Details for Bed Mobility Assistance: min cues for THP Transfers Transfers: Sit to Stand;Stand to Sit Sit to Stand: 5: Supervision Stand to Sit: 5: Supervision Details for Transfer Assistance: Pt with good safety awareness. Min VCs to maintain THP with mobility. Ambulation/Gait Ambulation/Gait Assistance: 5: Supervision Ambulation Distance (Feet): 1000 Feet Assistive device: Rolling walker Ambulation/Gait Assistance Details: min cues for posture, position from RW and ER on L Gait Pattern: Step-through pattern Stairs: Yes Stairs Assistance: 4: Min guard Stair Management Technique: No rails;Forwards;Step to pattern;With walker;One rail Right Number of Stairs: 3  (2 and 1 step)    Exercises     PT Diagnosis:    PT Problem List:   PT Treatment Interventions:     PT Goals Acute Rehab PT Goals PT Goal Formulation: With patient Time For Goal Achievement: 01/02/12 Potential to Achieve Goals: Good Pt will go Supine/Side to  Sit: with modified independence PT Goal: Supine/Side to Sit - Progress: Progressing toward goal Pt will go Sit to Supine/Side: with modified independence PT Goal: Sit to Supine/Side - Progress: Progressing toward goal Pt will go Sit to Stand: with supervision PT Goal: Sit to Stand - Progress: Progressing toward goal Pt will go Stand to Sit: with supervision PT Goal: Stand to Sit - Progress: Progressing toward goal Pt will Ambulate: >150 feet;with supervision;with rolling walker PT Goal: Ambulate - Progress: Progressing toward goal Pt will Go Up / Down Stairs: 1-2 stairs;with supervision;with rolling walker PT Goal: Up/Down Stairs - Progress: Progressing toward goal  Visit Information  Last PT Received On: 12/27/11 Assistance Needed: +1    Subjective Data  Subjective: I'm ready Patient Stated Goal: to go home   Cognition  Overall Cognitive Status: Appears within functional limits for tasks assessed/performed Arousal/Alertness: Awake/alert Orientation Level: Appears intact for tasks assessed Behavior During Session: [facility] for tasks performed    Balance     End of Session PT - End of Session Activity Tolerance: Patient tolerated treatment well Patient left: with call bell/phone within reach;in bed Nurse Communication: Mobility status   GP     [name] 12/27/2011, 2:45 PM

## 2011-12-27 NOTE — Evaluation (Signed)
Occupational Therapy Evaluation Patient Details Name: [name] MRN: [id number] DOB: 01-Oct-1960 Today's Date: 12/27/2011 Time: 4098-1191 OT Time Calculation (min): 27 min  OT Assessment / Plan / Recommendation Clinical Impression  Pt doing well POD 1 RTHR. All education completed. Pt will have prn A at d/c.    OT Assessment  Patient does not need any further OT services    Follow Up Recommendations  No OT follow up    Barriers to Discharge      Equipment Recommendations  None recommended by OT    Recommendations for Other Services    Frequency       Precautions / Restrictions Precautions Precautions: Posterior Hip Precaution Comments: Pt able to state 3/3 hip precautions. Restrictions Weight Bearing Restrictions: No   Pertinent Vitals/Pain Reported 1/10 pain. Repositioned in chair.    ADL  Grooming: Simulated;Supervision/safety Where Assessed - Grooming: Unsupported standing Toilet Transfer: Buyer, retail Method: Sit to Barista: Other (comment) (recliner.) Toileting - Clothing Manipulation and Hygiene: Simulated;Supervision/safety Where Assessed - Engineer, mining and Hygiene: Standing Tub/Shower Transfer: Engineer, manufacturing Method: Science writer: Walk in Scientist, research (physical sciences) Used: Rolling walker Transfers/Ambulation Related to ADLs: Pt ambulated to the bathroom with supervision.  ADL Comments: Educated pt how to safely step into and out of walk in shower. Pt states he feels comfortable with AE for LB ADLs.    OT Diagnosis:    OT Problem List:   OT Treatment Interventions:     OT Goals    Visit Information  Last OT Received On: 12/27/11 Assistance Needed: +1    Subjective Data  Subjective: I want to go back to the gym in January. Patient Stated Goal: Go home tomorrow.   Prior Functioning                 Vision/Perception     Cognition  Overall Cognitive Status: Appears within functional limits for tasks assessed/performed Arousal/Alertness: Awake/alert Orientation Level: Appears intact for tasks assessed Behavior During Session: [facility] for tasks performed    Extremity/Trunk Assessment Right Upper Extremity Assessment RUE ROM/Strength/Tone: [facility] for tasks assessed Left Upper Extremity Assessment LUE ROM/Strength/Tone: WFL for tasks assessed     Mobility Transfers Sit to Stand: 5: Supervision;From chair/3-in-1;With armrests;With upper extremity assist Stand to Sit: 5: Supervision;With upper extremity assist;With armrests;To chair/3-in-1 Details for Transfer Assistance: Pt with good safety awareness. Min VCs to maintain THP with mobility.     Shoulder Instructions     Exercise     Balance     End of Session OT - End of Session Activity Tolerance: Patient tolerated treatment well Patient left: in chair;with call bell/phone within reach  GO     Raja Liska A OTR/L [phone] 12/27/2011, 10:05 AM

## 2011-12-27 NOTE — Progress Notes (Signed)
Physical Therapy Treatment Patient Details Name: [name] MRN: [id number] DOB: [birth year] Today's Date: 12/27/2011 Time: 4098-1191 PT Time Calculation (min): 23 min  PT Assessment / Plan / Recommendation Comments on Treatment Session  Pt extremely motivated and progressing well    Follow Up Recommendations  Home health PT     Does the patient have the potential to tolerate intense rehabilitation     Barriers to Discharge        Equipment Recommendations  None recommended by OT    Recommendations for Other Services    Frequency 7X/week   Plan Discharge plan remains appropriate    Precautions / Restrictions Precautions Precautions: Posterior Hip Precaution Comments: Pt able to state 3/3 hip precautions. Restrictions Weight Bearing Restrictions: No Other Position/Activity Restrictions: WBAT R LE   Pertinent Vitals/Pain Pt reports min pain throughout session    Mobility  Bed Mobility Bed Mobility: Supine to Sit Supine to Sit: 4: Min guard Details for Bed Mobility Assistance: min cues for THP Transfers Transfers: Sit to Stand;Stand to Sit Sit to Stand: 5: Supervision;From chair/3-in-1;With armrests;With upper extremity assist Stand to Sit: 5: Supervision;With upper extremity assist;With armrests;To chair/3-in-1 Details for Transfer Assistance: Pt with good safety awareness. Min VCs to maintain THP with mobility. Ambulation/Gait Ambulation/Gait Assistance: 4: Min guard;5: Supervision Ambulation Distance (Feet): 200 Feet Assistive device: Rolling walker Ambulation/Gait Assistance Details: cues for posture, position from RW and ER on R Gait Pattern: Step-through pattern;Antalgic;Decreased step length - right    Exercises Total Joint Exercises Ankle Circles/Pumps: 20 reps;Supine;Both;AROM Quad Sets: AROM;15 reps;Supine;Both Gluteal Sets: AROM;Both;15 reps;Supine Heel Slides: AAROM;Right;20 reps Hip ABduction/ADduction: AAROM;Right;20 reps   PT Diagnosis:    PT  Problem List:   PT Treatment Interventions:     PT Goals Acute Rehab PT Goals PT Goal Formulation: With patient Time For Goal Achievement: 01/02/12 Potential to Achieve Goals: Good Pt will go Supine/Side to Sit: with modified independence PT Goal: Supine/Side to Sit - Progress: Progressing toward goal Pt will go Sit to Supine/Side: with modified independence PT Goal: Sit to Supine/Side - Progress: Progressing toward goal Pt will go Sit to Stand: with supervision PT Goal: Sit to Stand - Progress: Progressing toward goal Pt will go Stand to Sit: with supervision PT Goal: Stand to Sit - Progress: Progressing toward goal Pt will Ambulate: >150 feet;with supervision;with rolling walker PT Goal: Ambulate - Progress: Progressing toward goal Pt will Perform Home Exercise Program: with supervision, verbal cues required/provided PT Goal: Perform Home Exercise Program - Progress: Progressing toward goal  Visit Information  Last PT Received On: 12/27/11 Assistance Needed: +1    Subjective Data  Subjective: I'm doing better than yesterday and it hardly hurts at all Patient Stated Goal: to go home   Cognition  Overall Cognitive Status: Appears within functional limits for tasks assessed/performed Arousal/Alertness: Awake/alert Orientation Level: Appears intact for tasks assessed Behavior During Session: [facility] for tasks performed    Balance     End of Session PT - End of Session Activity Tolerance: Patient tolerated treatment well Patient left: in chair;with call bell/phone within reach Nurse Communication: Mobility status   GP     Chevez Sambrano 12/27/2011, 12:37 PM

## 2011-12-28 LAB — CBC
HCT: 37.7 % — ABNORMAL LOW (ref 39.0–52.0)
Hemoglobin: 13.2 g/dL (ref 13.0–17.0)
MCH: 29.9 pg (ref 26.0–34.0)
MCHC: 35 g/dL (ref 30.0–36.0)
MCV: 85.3 fL (ref 78.0–100.0)
Platelets: 241 10*3/uL (ref 150–400)
RBC: 4.42 MIL/uL (ref 4.22–5.81)
RDW: 13.1 % (ref 11.5–15.5)
WBC: 13.5 10*3/uL — ABNORMAL HIGH (ref 4.0–10.5)

## 2011-12-28 LAB — BASIC METABOLIC PANEL
BUN: 8 mg/dL (ref 6–23)
CO2: 26 mEq/L (ref 19–32)
Calcium: 8.7 mg/dL (ref 8.4–10.5)
Chloride: 100 mEq/L (ref 96–112)
Creatinine, Ser: 0.7 mg/dL (ref 0.50–1.35)
GFR calc Af Amer: >90 mL/min (ref >90)
GFR calc non Af Amer: >90 mL/min (ref >90)
Glucose, Bld: 134 mg/dL — ABNORMAL HIGH (ref 70–99)
Potassium: 3.5 mEq/L (ref 3.5–5.1)
Sodium: 135 mEq/L (ref 135–145)

## 2011-12-28 MED ORDER — METHOCARBAMOL 500 MG PO TABS: 500.0000 mg | ORAL_TABLET | Freq: Four times a day (QID) | ORAL | Status: DC | PRN

## 2011-12-28 MED ORDER — HYDROMORPHONE HCL 2 MG PO TABS: 2.0000 mg | ORAL_TABLET | ORAL | Status: DC | PRN

## 2011-12-28 MED ORDER — RIVAROXABAN 10 MG PO TABS: 10.0000 mg | ORAL_TABLET | Freq: Every day | ORAL | Status: DC

## 2011-12-28 NOTE — Progress Notes (Signed)
Pt for d/c home today with [facility]. IV dc'd. Dressing CDI to r hip. Dressing supplies given for home use. Discharge instructions & Rx given with verbalized understanding. Awaiting for wife to pick him up & assist with d/c. No changes in am assessments. Pain managed well with pain med as claimed.

## 2011-12-28 NOTE — Progress Notes (Signed)
   Subjective: 2 Days Post-Op Procedure(s) (LRB): TOTAL HIP ARTHROPLASTY (Right) Patient reports pain as mild.   Patient seen in rounds for Dr. Lequita Halt. Did well with therapy.  He walked about 1000 feet yesterday.  Wants to go home today. Patient is well, and has had no acute complaints or problems Patient is ready to go home.  Objective: Vital signs in last 24 hours: Temp:  [97.9 F (36.6 C)-98.4 F (36.9 C)] 98.3 F (36.8 C) (10/25 0525) Pulse Rate:  [62-87] 87  (10/25 0525) Resp:  [16] 16  (10/25 0800) BP: (111-116)/(67-73) 111/67 mmHg (10/25 0525) SpO2:  [99 %-100 %] 99 % (10/25 0800)  Intake/Output from previous day:  Intake/Output Summary (Last 24 hours) at 12/28/11 0827 Last data filed at 12/28/11 0700  Gross per 24 hour  Intake   2445 ml  Output   5575 ml  Net  -3130 ml     Labs:  Basename 12/28/11 0435 12/27/11 0459  HGB 13.2 14.0    Basename 12/28/11 0435 12/27/11 0459  WBC 13.5* 12.9*  RBC 4.42 4.59  HCT 37.7* 39.2  PLT 241 235    Basename 12/28/11 0435 12/27/11 0459  NA 135 137  K 3.5 3.9  CL 100 101  CO2 26 27  BUN 8 7  CREATININE 0.70 0.79  GLUCOSE 134* 146*  CALCIUM 8.7 8.9   No results found for this basename: LABPT:2,INR:2 in the last 72 hours  EXAM: General - Patient is Alert, Appropriate and Oriented Extremity - Neurovascular intact Sensation intact distally Dorsiflexion/Plantar flexion intact No cellulitis present Incision - clean, dry, no drainage, healing Motor Function - intact, moving foot and toes well on exam.   Assessment/Plan: 2 Days Post-Op Procedure(s) (LRB): TOTAL HIP ARTHROPLASTY (Right) Procedure(s) (LRB): TOTAL HIP ARTHROPLASTY (Right) Past Medical History  Diagnosis Date  . Colon polyps   . Depression   . GERD (gastroesophageal reflux disease)   . Arthritis   . Osteopenia   . Low back pain   . Cancer     10'13 prostate   Principal Problem:  *OA (osteoarthritis) of hip  Estimated Body mass index is  33.72 kg/(m^2) as calculated from the following:   Height as of this encounter: 5\' 10" (1.778 m).   Weight as of this encounter: 235 lb(106.595 kg). Discharge home with home health Diet - Cardiac diet Follow up - in 2 weeks Activity - WBAT Disposition - Home Condition Upon Discharge - Good D/C Meds - See DC Summary DVT Prophylaxis - Xarelto  PERKINS, ALEXZANDREW 12/28/2011, 8:27 AM

## 2011-12-28 NOTE — Discharge Summary (Signed)
Physician Discharge Summary   Patient ID: [name] MRN: [id number] DOB/AGE: 1960/11/02 51 y.o.  Admit date: 12/26/2011 Discharge date: 12/28/2011  Primary Diagnosis: Osteoarthritis Right hip   Admission Diagnoses:  Past Medical History  Diagnosis Date  . Colon polyps   . Depression   . GERD (gastroesophageal reflux disease)   . Arthritis   . Osteopenia   . Low back pain   . Cancer     10'13 prostate   Discharge Diagnoses:   Principal Problem:  *OA (osteoarthritis) of hip  Estimated Body mass index is 33.72 kg/(m^2) as calculated from the following:   Height as of this encounter: 5\' 10" (1.778 m).   Weight as of this encounter: 235 lb(106.595 kg).  Classification of overweight in adults according to BMI (WHO, 1998)   Procedure: Procedure(s) (LRB): TOTAL HIP ARTHROPLASTY (Right)   Consults: None  HPI: [name] is a 51 y.o. male with end stage arthritis of his right hip with progressively worsening pain and dysfunction. Pain occurs with activity and rest including pain at night. He has tried analgesics, protected weight bearing and rest without benefit. Pain is too severe to attempt physical therapy. Radiographs demonstrate bone on bone arthritis with subchondral cyst formation. He presents now for right THA.  Laboratory Data: Admission on 12/26/2011  Component Date Value Range Status  . ABO/RH(D) 12/26/2011 A POS   Final  . Antibody Screen 12/26/2011 NEG   Final  . Sample Expiration 12/26/2011 12/29/2011   Final  . WBC 12/27/2011 12.9* 4.0 - 10.5 K/uL Final  . RBC 12/27/2011 4.59  4.22 - 5.81 MIL/uL Final  . Hemoglobin 12/27/2011 14.0  13.0 - 17.0 g/dL Final  . HCT 40/98/1191 39.2  39.0 - 52.0 % Final  . MCV 12/27/2011 85.4  78.0 - 100.0 fL Final  . MCH 12/27/2011 30.5  26.0 - 34.0 pg Final  . MCHC 12/27/2011 35.7  30.0 - 36.0 g/dL Final  . RDW 47/82/9562 13.0  11.5 - 15.5 % Final  . Platelets 12/27/2011 235  150 - 400 K/uL Final  . Sodium 12/27/2011  137  135 - 145 mEq/L Final  . Potassium 12/27/2011 3.9  3.5 - 5.1 mEq/L Final  . Chloride 12/27/2011 101  96 - 112 mEq/L Final  . CO2 12/27/2011 27  19 - 32 mEq/L Final  . Glucose, Bld 12/27/2011 146* 70 - 99 mg/dL Final  . BUN 13/10/6576 7  6 - 23 mg/dL Final  . Creatinine, Ser 12/27/2011 0.79  0.50 - 1.35 mg/dL Final  . Calcium 46/96/2952 8.9  8.4 - 10.5 mg/dL Final  . GFR calc non Af Amer 12/27/2011 >90  >90 mL/min Final  . GFR calc Af Amer 12/27/2011 >90  >90 mL/min Final   Comment:                                 The eGFR has been calculated                          using the CKD EPI equation.                          This calculation has not been                          validated in all clinical  situations.                          eGFR's persistently                          <90 mL/min signify                          possible Chronic Kidney Disease.  . WBC 12/28/2011 13.5* 4.0 - 10.5 K/uL Final  . RBC 12/28/2011 4.42  4.22 - 5.81 MIL/uL Final  . Hemoglobin 12/28/2011 13.2  13.0 - 17.0 g/dL Final  . HCT 16/12/9602 37.7* 39.0 - 52.0 % Final  . MCV 12/28/2011 85.3  78.0 - 100.0 fL Final  . MCH 12/28/2011 29.9  26.0 - 34.0 pg Final  . MCHC 12/28/2011 35.0  30.0 - 36.0 g/dL Final  . RDW 54/11/8117 13.1  11.5 - 15.5 % Final  . Platelets 12/28/2011 241  150 - 400 K/uL Final  . Sodium 12/28/2011 135  135 - 145 mEq/L Final  . Potassium 12/28/2011 3.5  3.5 - 5.1 mEq/L Final  . Chloride 12/28/2011 100  96 - 112 mEq/L Final  . CO2 12/28/2011 26  19 - 32 mEq/L Final  . Glucose, Bld 12/28/2011 134* 70 - 99 mg/dL Final  . BUN 14/78/2956 8  6 - 23 mg/dL Final  . Creatinine, Ser 12/28/2011 0.70  0.50 - 1.35 mg/dL Final  . Calcium 21/30/8657 8.7  8.4 - 10.5 mg/dL Final  . GFR calc non Af Amer 12/28/2011 >90  >90 mL/min Final  . GFR calc Af Amer 12/28/2011 >90  >90 mL/min Final   Comment:                                 The eGFR has been calculated                           using the CKD EPI equation.                          This calculation has not been                          validated in all clinical                          situations.                          eGFR's persistently                          <90 mL/min signify                          possible Chronic Kidney Disease.  Hospital Outpatient Visit on 12/20/2011  Component Date Value Range Status  . MRSA, PCR 12/20/2011 NEGATIVE  NEGATIVE Final  . Staphylococcus aureus 12/20/2011 NEGATIVE  NEGATIVE Final   Comment:  The Xpert SA Assay (FDA                          approved for NASAL specimens                          in patients over 47 years of age),                          is one component of                          a comprehensive surveillance                          program.  Test performance has                          been validated by [employer] for patients greater                          than or equal to 37 year old.                          It is not intended                          to diagnose infection nor to                          guide or monitor treatment.  Marland Kitchen aPTT 12/20/2011 34  24 - 37 seconds Final  . WBC 12/20/2011 6.1  4.0 - 10.5 K/uL Final  . RBC 12/20/2011 5.26  4.22 - 5.81 MIL/uL Final  . Hemoglobin 12/20/2011 16.0  13.0 - 17.0 g/dL Final  . HCT 21/30/8657 45.2  39.0 - 52.0 % Final  . MCV 12/20/2011 85.9  78.0 - 100.0 fL Final  . MCH 12/20/2011 30.4  26.0 - 34.0 pg Final  . MCHC 12/20/2011 35.4  30.0 - 36.0 g/dL Final  . RDW 84/69/6295 13.0  11.5 - 15.5 % Final  . Platelets 12/20/2011 242  150 - 400 K/uL Final  . Sodium 12/20/2011 140  135 - 145 mEq/L Final  . Potassium 12/20/2011 4.1  3.5 - 5.1 mEq/L Final  . Chloride 12/20/2011 102  96 - 112 mEq/L Final  . CO2 12/20/2011 29  19 - 32 mEq/L Final  . Glucose, Bld 12/20/2011 96  70 - 99 mg/dL Final  . BUN 28/41/3244 11  6 - 23 mg/dL Final    . Creatinine, Ser 12/20/2011 0.90  0.50 - 1.35 mg/dL Final  . Calcium 03/07/7251 9.4  8.4 - 10.5 mg/dL Final  . Total Protein 12/20/2011 7.3  6.0 - 8.3 g/dL Final  . Albumin 66/44/0347 4.2  3.5 - 5.2 g/dL Final  . AST 42/59/5638 24  0 - 37 U/L Final  . ALT 12/20/2011 36  0 - 53 U/L Final  . Alkaline Phosphatase 12/20/2011 103  39 - 117 U/L Final  . Total Bilirubin 12/20/2011 0.4  0.3 - 1.2  mg/dL Final  . GFR calc non Af Amer 12/20/2011 >90  >90 mL/min Final  . GFR calc Af Amer 12/20/2011 >90  >90 mL/min Final   Comment:                                 The eGFR has been calculated                          using the CKD EPI equation.                          This calculation has not been                          validated in all clinical                          situations.                          eGFR's persistently                          <90 mL/min signify                          possible Chronic Kidney Disease.  Marland Kitchen Prothrombin Time 12/20/2011 13.2  11.6 - 15.2 seconds Final  . INR 12/20/2011 1.01  0.00 - 1.49 Final  . Color, Urine 12/20/2011 YELLOW  YELLOW Final  . APPearance 12/20/2011 CLEAR  CLEAR Final  . Specific Gravity, Urine 12/20/2011 1.008  1.005 - 1.030 Final  . pH 12/20/2011 7.5  5.0 - 8.0 Final  . Glucose, UA 12/20/2011 NEGATIVE  NEGATIVE mg/dL Final  . Hgb urine dipstick 12/20/2011 NEGATIVE  NEGATIVE Final  . Bilirubin Urine 12/20/2011 NEGATIVE  NEGATIVE Final  . Ketones, ur 12/20/2011 NEGATIVE  NEGATIVE mg/dL Final  . Protein, ur 29/56/2130 NEGATIVE  NEGATIVE mg/dL Final  . Urobilinogen, UA 12/20/2011 0.2  0.0 - 1.0 mg/dL Final  . Nitrite 86/57/8469 NEGATIVE  NEGATIVE Final  . Leukocytes, UA 12/20/2011 NEGATIVE  NEGATIVE Final   MICROSCOPIC NOT DONE ON URINES WITH NEGATIVE PROTEIN, BLOOD, LEUKOCYTES, NITRITE, OR GLUCOSE <1000 mg/dL.  Appointment on 11/12/2011  Component Date Value Range Status  . Cholesterol 11/12/2011 199  0 - 200 mg/dL Final   ATP III  Classification       Desirable:  < 200 mg/dL               Borderline High:  200 - 239 mg/dL          High:  > = 629 mg/dL  . Triglycerides 11/12/2011 80.0  0.0 - 149.0 mg/dL Final   Normal:  <528 mg/dLBorderline High:  150 - 199 mg/dL  . HDL 11/12/2011 41.60  >39.00 mg/dL Final  . VLDL 41/32/4401 16.0  0.0 - 40.0 mg/dL Final  . LDL Cholesterol 11/12/2011 141* 0 - 99 mg/dL Final  . Total CHOL/HDL Ratio 11/12/2011 5   Final                  Men          Women1/2 Average Risk     3.4  3.3Average Risk          5.0          4.42X Average Risk          9.6          7.13X Average Risk          15.0          11.0                      . Sodium 11/12/2011 143  135 - 145 mEq/L Final  . Potassium 11/12/2011 4.6  3.5 - 5.1 mEq/L Final  . Chloride 11/12/2011 107  96 - 112 mEq/L Final  . CO2 11/12/2011 28  19 - 32 mEq/L Final  . Glucose, Bld 11/12/2011 94  70 - 99 mg/dL Final  . BUN 78/29/5621 14  6 - 23 mg/dL Final  . Creatinine, Ser 11/12/2011 0.9  0.4 - 1.5 mg/dL Final  . Calcium 30/86/5784 9.2  8.4 - 10.5 mg/dL Final  . GFR 69/62/9528 91.56  >60.00 mL/min Final  . Total Bilirubin 11/12/2011 0.6  0.3 - 1.2 mg/dL Final  . Bilirubin, Direct 11/12/2011 0.1  0.0 - 0.3 mg/dL Final  . Alkaline Phosphatase 11/12/2011 79  39 - 117 U/L Final  . AST 11/12/2011 30  0 - 37 U/L Final  . ALT 11/12/2011 36  0 - 53 U/L Final  . Total Protein 11/12/2011 7.1  6.0 - 8.3 g/dL Final  . Albumin 41/32/4401 4.2  3.5 - 5.2 g/dL Final  . TSH 02/72/5366 2.28  0.35 - 5.50 uIU/mL Final  . WBC 11/12/2011 5.6  4.5 - 10.5 K/uL Final  . RBC 11/12/2011 5.16  4.22 - 5.81 Mil/uL Final  . Hemoglobin 11/12/2011 15.5  13.0 - 17.0 g/dL Final  . HCT 44/05/4740 46.0  39.0 - 52.0 % Final  . MCV 11/12/2011 89.1  78.0 - 100.0 fl Final  . MCHC 11/12/2011 33.7  30.0 - 36.0 g/dL Final  . RDW 59/56/3875 13.8  11.5 - 14.6 % Final  . Platelets 11/12/2011 225.0  150.0 - 400.0 K/uL Final  . Neutrophils Relative 11/12/2011 62.5  43.0 -  77.0 % Final  . Lymphocytes Relative 11/12/2011 22.8  12.0 - 46.0 % Final  . Monocytes Relative 11/12/2011 11.7  3.0 - 12.0 % Final  . Eosinophils Relative 11/12/2011 2.1  0.0 - 5.0 % Final  . Basophils Relative 11/12/2011 0.9  0.0 - 3.0 % Final  . Neutro Abs 11/12/2011 3.5  1.4 - 7.7 K/uL Final  . Lymphs Abs 11/12/2011 1.3  0.7 - 4.0 K/uL Final  . Monocytes Absolute 11/12/2011 0.7  0.1 - 1.0 K/uL Final  . Eosinophils Absolute 11/12/2011 0.1  0.0 - 0.7 K/uL Final  . Basophils Absolute 11/12/2011 0.0  0.0 - 0.1 K/uL Final     X-Rays:X-ray Hip Right Ap And Lateral  12/20/2011  *RADIOLOGY REPORT*  Clinical Data: Preop for right total hip replacement  RIGHT HIP - COMPLETE 2+ VIEW  Comparison: None.  Findings: Three views of the right hip submitted.  Extensive degenerative changes are noted with significant narrowing of superior joint space.  Sclerotic and cystic changes noted superior aspect of the femoral head and superior acetabulum.  Mild remodeling of femoral head.  Spurring of femoral head.  There is left hip prosthesis  in anatomic alignment. No acute fracture or subluxation.  IMPRESSION: No acute fracture or subluxation.  Extensive degenerative changes right hip joint as described above.  Left hip prosthesis in anatomic alignment.   Original Report Authenticated By: Natasha Mead, M.D.    Dg Pelvis Portable  12/26/2011  *RADIOLOGY REPORT*  Clinical Data: Postop hip replacement right  PORTABLE PELVIS  Comparison: None  Findings: Right hip replacement in satisfactory position  and alignment.  No fracture or acute complication.  Previously placed left hip replacement appears satisfactory.  IMPRESSION: Satisfactory right hip replacement.   Original Report Authenticated By: Camelia Phenes, M.D.    Dg Hip Portable 1 View Right  12/26/2011  *RADIOLOGY REPORT*  Clinical Data: Right hip replacement  PORTABLE RIGHT HIP - 1 VIEW  Comparison: 12/20/2011  Findings: Total hip replacement on the right in  satisfactory position and alignment.  No acute complication or fracture.  IMPRESSION: Satisfactory right hip replacement.   Original Report Authenticated By: Camelia Phenes, M.D.     EKG: Orders placed in visit on 11/08/11  . EKG 12-LEAD     Hospital Course:  Patient was admitted to [facility] and taken to the OR and underwent the above state procedure without complications.  Patient tolerated the procedure well and was later transferred to the recovery room and then to the orthopaedic floor for postoperative care.  They were given PO and IV analgesics for pain control following their surgery.  They were given 24 hours of postoperative antibiotics of  Anti-infectives     Start     Dose/Rate Route Frequency Ordered Stop   12/26/11 1800   ceFAZolin (ANCEF) IVPB 2 g/50 mL premix        2 g 100 mL/hr over 30 Minutes Intravenous Every 6 hours 12/26/11 1545 12/27/11 0040   12/26/11 0913   ceFAZolin (ANCEF) IVPB 2 g/50 mL premix        2 g 100 mL/hr over 30 Minutes Intravenous 60 min pre-op 12/26/11 0913 12/26/11 1202         and started on DVT prophylaxis in the form of Xarelto.   PT and OT were ordered for total hip protocol.  The patient was allowed to be WBAT with therapy. Discharge planning was consulted to help with postop disposition and equipment needs.  Patient had a good night on the evening of surgery and started to get up OOB with therapy on day one.  He walked 200 feet and then 1000 feet.  Hemovac drain was pulled without difficulty.  The knee immobilizer was removed and discontinued.  Continued to work with therapy into day two.  Dressing was changed on day two and the incision was healing well.  Patient was seen in rounds and was ready to go home later that day.  Discharge Medications: Prior to Admission medications   Medication Sig Start Date End Date Taking? Authorizing Provider  HYDROmorphone (DILAUDID) 2 MG tablet Take 1-2 tablets (2-4 mg total) by mouth every 4  (four) hours as needed. 12/28/11   Danyal Whitenack Julien Girt, PA  methocarbamol (ROBAXIN) 500 MG tablet Take 1 tablet (500 mg total) by mouth every 6 (six) hours as needed. 12/28/11   Jaydi Bray Julien Girt, PA  rivaroxaban (XARELTO) 10 MG TABS tablet Take 1 tablet (10 mg total) by mouth daily with breakfast. Take Xarelto for two and a half more weeks, then discontinue Xarelto. 12/28/11   Antwoin Lackey Julien Girt, PA    Diet: Cardiac diet Activity:WBAT No bending hip over 90 degrees- A "L" Angle Do not cross legs Do not let foot roll inward When turning these patients a pillow should be placed between the patient's  legs to prevent crossing. Patients should have the affected knee fully extended when trying to sit or stand from all surfaces to prevent excessive hip flexion. When ambulating and turning toward the affected side the affected leg should have the toes turned out prior to moving the walker and the rest of patient's body as to prevent internal rotation/ turning in of the leg. Abduction pillows are the most effective way to prevent a patient from not crossing legs or turning toes in at rest. If an abduction pillow is not ordered placing a regular pillow length wise between the patient's legs is also an effective reminder. It is imperative that these precautions be maintained so that the surgical hip does not dislocate. Follow-up:in 2 weeks Disposition - Home Discharged Condition: good   Discharge Orders    Future Orders Please Complete By Expires   Diet - low sodium heart healthy      Call MD / Call 911      Comments:   If you experience chest pain or shortness of breath, CALL 911 and be transported to the hospital emergency room.  If you develope a fever above 101 F, pus (white drainage) or increased drainage or redness at the wound, or calf pain, call your surgeon's office.   Discharge instructions      Comments:   Pick up stool softner and laxative for home. Do not submerge incision under  water. May shower. Continue to use ice for pain and swelling from surgery. Hip precautions.  Total Hip Protocol.  Take Xarelto for two and a half more weeks, then discontinue Xarelto.   Constipation Prevention      Comments:   Drink plenty of fluids.  Prune juice may be helpful.  You may use a stool softener, such as Colace (over the counter) 100 mg twice a day.  Use MiraLax (over the counter) for constipation as needed.   Increase activity slowly as tolerated      Patient may shower      Comments:   You may shower without a dressing once there is no drainage.  Do not wash over the wound.  If drainage remains, do not shower until drainage stops.   Weight bearing as tolerated      Driving restrictions      Comments:   No driving until released by the physician.   Lifting restrictions      Comments:   No lifting until released by the physician.   Follow the hip precautions as taught in Physical Therapy      Change dressing      Comments:   You may change your dressing dressing daily with sterile 4 x 4 inch gauze dressing and paper tape.  Do not submerge the incision under water.   TED hose      Comments:   Use stockings (TED hose) for 3 weeks on both leg(s).  You may remove them at night for sleeping.   Do not sit on low chairs, stoools or toilet seats, as it may be difficult to get up from low surfaces          Medication List     As of 12/28/2011  8:33 AM    STOP taking these medications         diclofenac 75 MG EC tablet   Commonly known as: VOLTAREN      HYDROcodone-acetaminophen 5-325 MG per tablet   Commonly known as: NORCO/VICODIN      KRILL OIL PO  Vitamin D-3 5000 UNITS Tabs      TAKE these medications         HYDROmorphone 2 MG tablet   Commonly known as: DILAUDID   Take 1-2 tablets (2-4 mg total) by mouth every 4 (four) hours as needed.      methocarbamol 500 MG tablet   Commonly known as: ROBAXIN   Take 1 tablet (500 mg total) by mouth every 6  (six) hours as needed.      rivaroxaban 10 MG Tabs tablet   Commonly known as: XARELTO   Take 1 tablet (10 mg total) by mouth daily with breakfast. Take Xarelto for two and a half more weeks, then discontinue Xarelto.           Follow-up Information    Follow up with Loanne Drilling, MD. Schedule an appointment as soon as possible for a visit in 2 weeks.   Contact information:   [address], [address] [place] [address] [phone]          Signed: Patrica Duel 12/28/2011, 8:33 AM

## 2011-12-28 NOTE — Care Management Note (Signed)
    Page 1 of 2   12/28/2011     3:05:34 PM   CARE MANAGEMENT NOTE 12/28/2011  Patient:  [name], [name]   Account Number:  [id number]  Date Initiated:  12/28/2011  Documentation initiated by:  Colleen Can  Subjective/Objective Assessment:   dx osteoarthritis right hip; total hip replacemnt     Action/Plan:   CM spoke with patient and plans are for him to return to his home where family will be caregivers. Already has RW, reacher. Has walk in shower and raised toilet seat  .   Anticipated DC Date:  12/28/2011   Anticipated DC Plan:  HOME W HOME HEALTH SERVICES  In-house referral  NA      DC Planning Services  CM consult      [facility] Choice  HOME HEALTH   Choice offered to / List presented to:  C-1 Patient   DME arranged  NA      DME agency  NA     HH arranged  HH-2 PT      [facility] agency  [facility]   Status of service:  Completed, signed off Medicare Important Message given?   (If response is "NO", the following Medicare IM given date fields will be blank) Date Medicare IM given:   Date Additional Medicare IM given:    Discharge Disposition:  HOME W HOME HEALTH SERVICES  Per UR Regulation:  Reviewed for med. necessity/level of care/duration of stay  If discussed at Long Length of Stay Meetings, dates discussed:    Comments:  12/28/2011 Raynelle Bring BSN CCM [phone] Pt discahrged today with [facility] services in place. HHPT will start tomorrow 12/29/2011.

## 2011-12-28 NOTE — Progress Notes (Signed)
Physical Therapy Treatment Patient Details Name: [name] MRN: [id number] DOB: March 09, 1960 Today's Date: 12/28/2011 Time: 0931-1002 PT Time Calculation (min): 31 min  PT Assessment / Plan / Recommendation Comments on Treatment Session  Reviewed car transfers    Follow Up Recommendations  Home health PT     Does the patient have the potential to tolerate intense rehabilitation     Barriers to Discharge        Equipment Recommendations  None recommended by OT    Recommendations for Other Services    Frequency 7X/week   Plan Discharge plan remains appropriate    Precautions / Restrictions Precautions Precautions: Posterior Hip Precaution Comments: Pt able to state 3/3 hip precautions. Restrictions Weight Bearing Restrictions: No Other Position/Activity Restrictions: WBAT R LE   Pertinent Vitals/Pain 3-4/10; premedicated, ice packs provided    Mobility  Transfers Transfers: Sit to Stand;Stand to Sit Sit to Stand: 6: Modified independent (Device/Increase time) Stand to Sit: 6: Modified independent (Device/Increase time) Ambulation/Gait Ambulation/Gait Assistance: 5: Supervision Ambulation Distance (Feet): 400 Feet Assistive device: Rolling walker Ambulation/Gait Assistance Details: min cues for posture and position from RW Gait Pattern: Step-through pattern Stairs:  (Pt states he is comfortable with abitliy on stairs)    Exercises Total Joint Exercises Ankle Circles/Pumps: 20 reps;Supine;Both;AROM Quad Sets: AROM;Supine;Both;20 reps Gluteal Sets: AROM;Both;Supine;20 reps Heel Slides: AAROM;Right;20 reps Hip ABduction/ADduction: AAROM;Right;20 reps Long Arc Quad: Both;AROM;20 reps;Seated   PT Diagnosis:    PT Problem List:   PT Treatment Interventions:     PT Goals Acute Rehab PT Goals PT Goal Formulation: With patient Time For Goal Achievement: 01/02/12 Potential to Achieve Goals: Good Pt will go Supine/Side to Sit: with modified independence PT Goal:  Supine/Side to Sit - Progress: Progressing toward goal Pt will go Sit to Supine/Side: with modified independence PT Goal: Sit to Supine/Side - Progress: Progressing toward goal Pt will go Sit to Stand: with supervision PT Goal: Sit to Stand - Progress: Met Pt will go Stand to Sit: with supervision PT Goal: Stand to Sit - Progress: Met Pt will Ambulate: >150 feet;with supervision;with rolling walker PT Goal: Ambulate - Progress: Met Pt will Go Up / Down Stairs: 1-2 stairs;with supervision;with rolling walker PT Goal: Up/Down Stairs - Progress: Met Pt will Perform Home Exercise Program: with supervision, verbal cues required/provided PT Goal: Perform Home Exercise Program - Progress: Met  Visit Information  Last PT Received On: 12/28/11 Assistance Needed: +1    Subjective Data  Subjective: I'm ready Patient Stated Goal: to go home   Cognition  Overall Cognitive Status: Appears within functional limits for tasks assessed/performed Arousal/Alertness: Awake/alert Orientation Level: Appears intact for tasks assessed Behavior During Session: [facility] for tasks performed    Balance     End of Session PT - End of Session Activity Tolerance: Patient tolerated treatment well Patient left: with call bell/phone within reach;in bed Nurse Communication: Mobility status   GP     Meliton Samad 12/28/2011, 12:08 PM

## 2012-01-25 ENCOUNTER — Other Ambulatory Visit: Payer: Self-pay | Admitting: Family Medicine

## 2012-05-26 ENCOUNTER — Telehealth: Payer: Self-pay | Admitting: Family Medicine

## 2012-05-26 NOTE — Telephone Encounter (Signed)
Just have him make a morning OV with me and be fasting. We will get labs that day

## 2012-05-26 NOTE — Telephone Encounter (Signed)
Pt needing follow up on meds.  But he thinks he may needs labs prior. Pls advise

## 2012-05-26 NOTE — Telephone Encounter (Signed)
Can you call pt to schedule the office visit? See below note

## 2012-05-27 NOTE — Telephone Encounter (Signed)
appt set/kh 

## 2012-06-06 ENCOUNTER — Encounter: Payer: Self-pay | Admitting: Family Medicine

## 2012-06-06 ENCOUNTER — Ambulatory Visit (INDEPENDENT_AMBULATORY_CARE_PROVIDER_SITE_OTHER): Payer: BC Managed Care – PPO | Admitting: Family Medicine

## 2012-06-06 VITALS — BP 120/80 | HR 96 | Temp 99.5°F | Wt 232.0 lb

## 2012-06-06 DIAGNOSIS — M545 Low back pain, unspecified: Secondary | ICD-10-CM

## 2012-06-06 DIAGNOSIS — E785 Hyperlipidemia, unspecified: Secondary | ICD-10-CM

## 2012-06-06 LAB — HEPATIC FUNCTION PANEL
ALT: 30 U/L (ref 0–53)
AST: 22 U/L (ref 0–37)
Albumin: 4.2 g/dL (ref 3.5–5.2)
Alkaline Phosphatase: 83 U/L (ref 39–117)
Bilirubin, Direct: 0.2 mg/dL (ref 0.0–0.3)
Total Bilirubin: 1 mg/dL (ref 0.3–1.2)
Total Protein: 7.4 g/dL (ref 6.0–8.3)

## 2012-06-06 LAB — CBC WITH DIFFERENTIAL/PLATELET
Basophils Absolute: 0 10*3/uL (ref 0.0–0.1)
Basophils Relative: 0.9 % (ref 0.0–3.0)
Eosinophils Absolute: 0.1 10*3/uL (ref 0.0–0.7)
Eosinophils Relative: 2.2 % (ref 0.0–5.0)
HCT: 46.1 % (ref 39.0–52.0)
Hemoglobin: 15.9 g/dL (ref 13.0–17.0)
Lymphocytes Relative: 20.1 % (ref 12.0–46.0)
Lymphs Abs: 0.9 10*3/uL (ref 0.7–4.0)
MCHC: 34.4 g/dL (ref 30.0–36.0)
MCV: 86.8 fl (ref 78.0–100.0)
Monocytes Absolute: 0.5 10*3/uL (ref 0.1–1.0)
Monocytes Relative: 11 % (ref 3.0–12.0)
Neutro Abs: 3.1 10*3/uL (ref 1.4–7.7)
Neutrophils Relative %: 65.8 % (ref 43.0–77.0)
Platelets: 224 10*3/uL (ref 150.0–400.0)
RBC: 5.31 Mil/uL (ref 4.22–5.81)
RDW: 15.1 % — ABNORMAL HIGH (ref 11.5–14.6)
WBC: 4.7 10*3/uL (ref 4.5–10.5)

## 2012-06-06 LAB — BASIC METABOLIC PANEL
BUN: 16 mg/dL (ref 6–23)
CO2: 28 mEq/L (ref 19–32)
Calcium: 9.1 mg/dL (ref 8.4–10.5)
Chloride: 103 mEq/L (ref 96–112)
Creatinine, Ser: 0.9 mg/dL (ref 0.4–1.5)
GFR: 99.99 mL/min (ref >60.00)
Glucose, Bld: 75 mg/dL (ref 70–99)
Potassium: 4.2 mEq/L (ref 3.5–5.1)
Sodium: 139 mEq/L (ref 135–145)

## 2012-06-06 LAB — LIPID PANEL
Cholesterol: 175 mg/dL (ref 0–200)
HDL: 32.4 mg/dL — ABNORMAL LOW (ref >39.00)
LDL Cholesterol: 129 mg/dL — ABNORMAL HIGH (ref 0–99)
Total CHOL/HDL Ratio: 5
Triglycerides: 68 mg/dL (ref 0.0–149.0)
VLDL: 13.6 mg/dL (ref 0.0–40.0)

## 2012-06-06 LAB — TSH: TSH: 2.09 u[IU]/mL (ref 0.35–5.50)

## 2012-06-06 MED ORDER — HYDROCODONE-ACETAMINOPHEN 5-325 MG PO TABS: 1.0000 | ORAL_TABLET | Freq: Four times a day (QID) | ORAL | Status: DC | PRN

## 2012-06-06 MED ORDER — DICLOFENAC SODIUM 75 MG PO TBEC: 75.0000 mg | DELAYED_RELEASE_TABLET | Freq: Two times a day (BID) | ORAL | Status: DC

## 2012-06-06 NOTE — Progress Notes (Signed)
  Subjective:    Patient ID: [name], male    DOB: 31-Jan-1961, 52 y.o.   MRN: [id number]  HPI Here for meds and to have labs drawn. We last got labs 6 months ago and we were following his elevated cholesterol levels. Since then he has been following an aggressive low fat and low carb diet, and he has lost 27 lbs. He feels great and has no concerns except for low back pain. He is scheduled to have a lumbar fusion in May per her neurosurgeon at [facility].    Review of Systems  Constitutional: Negative.   Respiratory: Negative.   Cardiovascular: Negative.   Musculoskeletal: Positive for back pain.       Objective:   Physical Exam  Constitutional: He appears well-developed and well-nourished.  Cardiovascular: Normal rate, regular rhythm, normal heart sounds and intact distal pulses.   Pulmonary/Chest: Effort normal and breath sounds normal.          Assessment & Plan:  Get fasting labs today. Refilled pain meds.

## 2012-06-10 NOTE — Progress Notes (Signed)
Quick Note:  I spoke with pt ______ 

## 2012-06-13 ENCOUNTER — Telehealth: Payer: Self-pay | Admitting: Family Medicine

## 2012-06-13 NOTE — Telephone Encounter (Signed)
Pt wanted to know when to recheck his glucose level, since it was up his last lab visit? Per Dr. Clent Ridges, pt can recheck in 3 months. I spoke with pt and gave this information.

## 2012-08-15 DIAGNOSIS — M48061 Spinal stenosis, lumbar region without neurogenic claudication: Secondary | ICD-10-CM | POA: Insufficient documentation

## 2012-08-15 DIAGNOSIS — M5137 Other intervertebral disc degeneration, lumbosacral region: Secondary | ICD-10-CM | POA: Insufficient documentation

## 2012-08-15 DIAGNOSIS — M51379 Other intervertebral disc degeneration, lumbosacral region without mention of lumbar back pain or lower extremity pain: Secondary | ICD-10-CM | POA: Insufficient documentation

## 2012-09-03 ENCOUNTER — Ambulatory Visit: Payer: BC Managed Care – PPO | Admitting: Family Medicine

## 2012-12-09 DIAGNOSIS — Z9889 Other specified postprocedural states: Secondary | ICD-10-CM | POA: Insufficient documentation

## 2013-01-08 ENCOUNTER — Other Ambulatory Visit: Payer: Self-pay

## 2013-03-05 HISTORY — PX: LUMBAR FUSION: SHX111

## 2013-03-05 HISTORY — PX: CERVICAL FUSION: SHX112

## 2013-05-26 ENCOUNTER — Encounter: Payer: Self-pay | Admitting: Family Medicine

## 2013-05-26 ENCOUNTER — Ambulatory Visit (INDEPENDENT_AMBULATORY_CARE_PROVIDER_SITE_OTHER): Payer: 59 | Admitting: Family Medicine

## 2013-05-26 VITALS — BP 120/76 | Temp 98.0°F | Wt 251.0 lb

## 2013-05-26 DIAGNOSIS — J329 Chronic sinusitis, unspecified: Secondary | ICD-10-CM

## 2013-05-26 MED ORDER — AMOXICILLIN 875 MG PO TABS: 875.0000 mg | ORAL_TABLET | Freq: Two times a day (BID) | ORAL | Status: DC

## 2013-05-26 NOTE — Progress Notes (Signed)
Pre visit review using our clinic review tool, if applicable. No additional management support is needed unless otherwise documented below in the visit note. 

## 2013-05-26 NOTE — Patient Instructions (Signed)
INSTRUCTIONS FOR UPPER RESPIRATORY INFECTION:  -plenty of rest and fluids  -nasal saline wash 2-3 times daily (use prepackaged nasal saline or bottled/distilled water if making your own)   -clean nose with nasal saline before using the nasal steroid or sinex  -can use Afrin or sinex sinex nasal spray for drainage and nasal congestion - but do NOT use longer then 3-4 days  -can use tylenol or ibuprofen as directed for aches and sorethroat  -in the winter time, using a humidifier at night is helpful (please follow cleaning instructions)  -if you are taking a cough medication - use only as directed, may also try a teaspoon of honey to coat the throat and throat lozenges  -for sore throat, salt water gargles can help  -follow up if you have fevers, facial pain, tooth pain, difficulty breathing or are worsening or not getting better in 5-7 days

## 2013-05-26 NOTE — Progress Notes (Signed)
Chief Complaint  Patient presents with  . Sore Throat  . Allergies    HPI:  Upper resp symptoms: -started: 5 days ago -symptoms:nasal congestion, sore throat, cough, PND, a little sinus pain for 4-5 days, thick mucus with blood in it when blows his nose -denies:fever, SOB, NVD, tooth pain, body aches -has tried: nyquil -sick contacts/travel/risks: denies flu exposure or Ebola risks  ROS: See pertinent positives and negatives per HPI.  Past Medical History  Diagnosis Date  . Colon polyps   . Depression   . GERD (gastroesophageal reflux disease)   . Arthritis   . Osteopenia   . Low back pain   . Cancer     10'13 prostate    Past Surgical History  Procedure Laterality Date  . Cystectomy      benign, right hip  . Laminectomy  2/09    lumbar  . Gynecomastia excision      bilateral, reduction  . Lumbar microdiscectomy  10/15/08    L-4-5, per Dr. Jenne Campus at [facility]  . Prostate surgery  12-13-10    robotic prostatectomy per Dr. Risa Grill   . Joint replacement  12-20-11    hx. LTHA, now RTHA planned  . Total hip arthroplasty  12-20-11    2010 left hip  . Total hip arthroplasty  12/26/2011    Procedure: TOTAL HIP ARTHROPLASTY;  Surgeon: Gearlean Alf, MD;  Location: [facility];  Service: Orthopedics;  Laterality: Right;    Family History  Problem Relation Age of Onset  . Arthritis    . Hypertension      History   Social History  . Marital Status: Married    Spouse Name: N/A    Number of Children: N/A  . Years of Education: N/A   Social History Main Topics  . Smoking status: Never Smoker   . Smokeless tobacco: Never Used  . Alcohol Use: Yes     Comment: rare  . Drug Use: No  . Sexual Activity: Yes   Other Topics Concern  . None   Social History Narrative  . None    Current outpatient prescriptions:diclofenac (VOLTAREN) 75 MG EC tablet, Take 1 tablet (75 mg total) by mouth 2 (two) times daily., Disp: 60 tablet, Rfl: 11;  amoxicillin  (AMOXIL) 875 MG tablet, Take 1 tablet (875 mg total) by mouth 2 (two) times daily., Disp: 20 tablet, Rfl: 0;  HYDROcodone-acetaminophen (NORCO/VICODIN) 5-325 MG per tablet, Take 1 tablet by mouth every 6 (six) hours as needed for pain., Disp: 60 tablet, Rfl: 2  EXAM:  Filed Vitals:   05/26/13 1302  BP: 120/76  Temp: 98 F (36.7 C)    Body mass index is 36.01 kg/(m^2).  GENERAL: vitals reviewed and listed above, alert, oriented, appears well hydrated and in no acute distress  HEENT: atraumatic, conjunttiva clear, no obvious abnormalities on inspection of external nose and ears, normal appearance of ear canals and TMs, clear nasal congestion, mild post oropharyngeal erythema with PND, no tonsillar edema or exudate, no sinus TTP  NECK: no obvious masses on inspection  LUNGS: clear to auscultation bilaterally, no wheezes, rales or rhonchi, good air movement  CV: HRRR, no peripheral edema  MS: moves all extremities without noticeable abnormality  PSYCH: pleasant and cooperative, no obvious depression or anxiety  ASSESSMENT AND PLAN:  Discussed the following assessment and plan:  Sinusitis - Plan: amoxicillin (AMOXIL) 875 MG tablet  -given HPI and exam findings today, a serious infection or illness is unlikely. We  discussed potential etiologies, with VURI being most likely, and advised supportive care and monitoring. We discussed treatment side effects, likely course, antibiotic misuse, transmission, and signs of developing a serious illness. -given some sinus pain advised if this persists may be bacterial sinusitis and amox if this were the case - risks discussed -of course, we advised to return or notify a doctor immediately if symptoms worsen or persist or new concerns arise.    Patient Instructions  INSTRUCTIONS FOR UPPER RESPIRATORY INFECTION:  -plenty of rest and fluids  -nasal saline wash 2-3 times daily (use prepackaged nasal saline or bottled/distilled water if making  your own)   -clean nose with nasal saline before using the nasal steroid or sinex  -can use Afrin or sinex sinex nasal spray for drainage and nasal congestion - but do NOT use longer then 3-4 days  -can use tylenol or ibuprofen as directed for aches and sorethroat  -in the winter time, using a humidifier at night is helpful (please follow cleaning instructions)  -if you are taking a cough medication - use only as directed, may also try a teaspoon of honey to coat the throat and throat lozenges  -for sore throat, salt water gargles can help  -follow up if you have fevers, facial pain, tooth pain, difficulty breathing or are worsening or not getting better in 5-7 days      KIM, HANNAH R.

## 2013-06-16 ENCOUNTER — Telehealth: Payer: Self-pay | Admitting: Family Medicine

## 2013-06-16 NOTE — Telephone Encounter (Signed)
[name], [name] is requesting re-fill on diclofenac (VOLTAREN) 75 MG EC tablet

## 2013-06-17 MED ORDER — DICLOFENAC SODIUM 75 MG PO TBEC: 75.0000 mg | DELAYED_RELEASE_TABLET | Freq: Two times a day (BID) | ORAL | Status: DC

## 2013-06-17 NOTE — Telephone Encounter (Signed)
Rx sent to pharmacy   

## 2013-06-17 NOTE — Telephone Encounter (Signed)
Pt is out of med

## 2013-06-18 ENCOUNTER — Other Ambulatory Visit: Payer: Self-pay | Admitting: Family Medicine

## 2013-09-23 DIAGNOSIS — M47812 Spondylosis without myelopathy or radiculopathy, cervical region: Secondary | ICD-10-CM | POA: Insufficient documentation

## 2013-10-19 DIAGNOSIS — Z981 Arthrodesis status: Secondary | ICD-10-CM | POA: Insufficient documentation

## 2013-11-12 ENCOUNTER — Other Ambulatory Visit (INDEPENDENT_AMBULATORY_CARE_PROVIDER_SITE_OTHER): Payer: 59

## 2013-11-12 DIAGNOSIS — Z8546 Personal history of malignant neoplasm of prostate: Secondary | ICD-10-CM

## 2013-11-12 DIAGNOSIS — Z0000 Encounter for general adult medical examination without abnormal findings: Secondary | ICD-10-CM

## 2013-11-12 LAB — POCT URINALYSIS DIPSTICK
Bilirubin, UA: NEGATIVE
Blood, UA: NEGATIVE
Glucose, UA: NEGATIVE
Ketones, UA: NEGATIVE
Leukocytes, UA: NEGATIVE
Nitrite, UA: NEGATIVE
Protein, UA: NEGATIVE
Spec Grav, UA: <=1.005
Urobilinogen, UA: 0.2
pH, UA: 6

## 2013-11-12 LAB — CBC WITH DIFFERENTIAL/PLATELET
Basophils Absolute: 0 10*3/uL (ref 0.0–0.1)
Basophils Relative: 0.7 % (ref 0.0–3.0)
Eosinophils Absolute: 0.1 10*3/uL (ref 0.0–0.7)
Eosinophils Relative: 2.4 % (ref 0.0–5.0)
HCT: 43.6 % (ref 39.0–52.0)
Hemoglobin: 14.8 g/dL (ref 13.0–17.0)
Lymphocytes Relative: 19.7 % (ref 12.0–46.0)
Lymphs Abs: 0.9 10*3/uL (ref 0.7–4.0)
MCHC: 34 g/dL (ref 30.0–36.0)
MCV: 81.8 fl (ref 78.0–100.0)
Monocytes Absolute: 0.6 10*3/uL (ref 0.1–1.0)
Monocytes Relative: 13.3 % — ABNORMAL HIGH (ref 3.0–12.0)
Neutro Abs: 3.1 10*3/uL (ref 1.4–7.7)
Neutrophils Relative %: 63.9 % (ref 43.0–77.0)
Platelets: 264 10*3/uL (ref 150.0–400.0)
RBC: 5.34 Mil/uL (ref 4.22–5.81)
RDW: 15.5 % (ref 11.5–15.5)
WBC: 4.8 10*3/uL (ref 4.0–10.5)

## 2013-11-12 LAB — BASIC METABOLIC PANEL
BUN: 14 mg/dL (ref 6–23)
CO2: 28 mEq/L (ref 19–32)
Calcium: 9.1 mg/dL (ref 8.4–10.5)
Chloride: 104 mEq/L (ref 96–112)
Creatinine, Ser: 0.8 mg/dL (ref 0.4–1.5)
GFR: 102.15 mL/min (ref >60.00)
Glucose, Bld: 90 mg/dL (ref 70–99)
Potassium: 4.2 mEq/L (ref 3.5–5.1)
Sodium: 140 mEq/L (ref 135–145)

## 2013-11-12 LAB — LIPID PANEL
Cholesterol: 207 mg/dL — ABNORMAL HIGH (ref 0–200)
HDL: 33 mg/dL — ABNORMAL LOW (ref >39.00)
LDL Cholesterol: 156 mg/dL — ABNORMAL HIGH (ref 0–99)
NonHDL: 174
Total CHOL/HDL Ratio: 6
Triglycerides: 91 mg/dL (ref 0.0–149.0)
VLDL: 18.2 mg/dL (ref 0.0–40.0)

## 2013-11-12 LAB — HEPATIC FUNCTION PANEL
ALT: 39 U/L (ref 0–53)
AST: 28 U/L (ref 0–37)
Albumin: 4 g/dL (ref 3.5–5.2)
Alkaline Phosphatase: 107 U/L (ref 39–117)
Bilirubin, Direct: 0 mg/dL (ref 0.0–0.3)
Total Bilirubin: 0.5 mg/dL (ref 0.2–1.2)
Total Protein: 7.3 g/dL (ref 6.0–8.3)

## 2013-11-12 LAB — TESTOSTERONE: Testosterone: 256.77 ng/dL — ABNORMAL LOW (ref 300.00–890.00)

## 2013-11-12 LAB — TSH: TSH: 0.85 u[IU]/mL (ref 0.35–4.50)

## 2013-11-12 NOTE — Addendum Note (Signed)
Addended by: Townsend Roger D on: 11/12/2013 09:21 AM   Modules accepted: Orders

## 2013-11-12 NOTE — Addendum Note (Signed)
Addended by: Townsend Roger D on: 11/12/2013 09:38 AM   Modules accepted: Orders

## 2013-11-12 NOTE — Addendum Note (Signed)
Addended by: Townsend Roger D on: 11/12/2013 09:06 AM   Modules accepted: Orders

## 2013-11-16 ENCOUNTER — Ambulatory Visit (INDEPENDENT_AMBULATORY_CARE_PROVIDER_SITE_OTHER)
Admission: RE | Admit: 2013-11-16 | Discharge: 2013-11-16 | Disposition: A | Discharge status: Home / Self Care | Payer: 59 | Source: Ambulatory Visit | Attending: Family Medicine | Admitting: Family Medicine

## 2013-11-16 ENCOUNTER — Ambulatory Visit (INDEPENDENT_AMBULATORY_CARE_PROVIDER_SITE_OTHER): Payer: 59 | Admitting: Family Medicine

## 2013-11-16 ENCOUNTER — Encounter: Payer: Self-pay | Admitting: Family Medicine

## 2013-11-16 VITALS — BP 120/74 | Temp 98.2°F | Ht 71.0 in | Wt 251.0 lb

## 2013-11-16 DIAGNOSIS — Z0000 Encounter for general adult medical examination without abnormal findings: Secondary | ICD-10-CM

## 2013-11-16 DIAGNOSIS — R053 Chronic cough: Secondary | ICD-10-CM

## 2013-11-16 DIAGNOSIS — R05 Cough: Secondary | ICD-10-CM

## 2013-11-16 DIAGNOSIS — R059 Cough, unspecified: Secondary | ICD-10-CM

## 2013-11-16 MED ORDER — DICLOFENAC SODIUM 75 MG PO TBEC: 75.0000 mg | DELAYED_RELEASE_TABLET | Freq: Two times a day (BID) | ORAL | Status: DC

## 2013-11-16 MED ORDER — OMEPRAZOLE 40 MG PO CPDR: 40.0000 mg | DELAYED_RELEASE_CAPSULE | Freq: Every day | ORAL | Status: DC

## 2013-11-16 NOTE — Progress Notes (Signed)
   Subjective:    Patient ID: [name], male    DOB: [birth year], 53 y.o.   MRN: [id number]  HPI 53 yr old male for a cpx. He is doing well in general. He has had several spinal fusion surgeries this year, and he is recovering from these steadily. He does mention a chronic cough which is centered in the back of the throat and which started about 3 months ago. He has a hx of GERD and he took PPIs in the past for this. He had true heartburn at that time, but he denies any heartburn lately. He does often get choked shortly after eating and he often wakes up at night feeling like stomach contents have entered his mouth. He has an appt to follow up with Dr. Risa Grill to check a PSA, etc in October.    Review of Systems  Constitutional: Negative.   HENT: Negative.   Eyes: Negative.   Respiratory: Positive for cough and choking. Negative for apnea, chest tightness, shortness of breath, wheezing and stridor.   Cardiovascular: Negative.   Gastrointestinal: Negative.   Genitourinary: Negative.   Musculoskeletal: Negative.   Skin: Negative.   Neurological: Negative.   Psychiatric/Behavioral: Negative.        Objective:   Physical Exam  Constitutional: He is oriented to person, place, and time. He appears well-developed and well-nourished. No distress.  HENT:  Head: Normocephalic and atraumatic.  Right Ear: External ear normal.  Left Ear: External ear normal.  Nose: Nose normal.  Mouth/Throat: Oropharynx is clear and moist. No oropharyngeal exudate.  Eyes: Conjunctivae and EOM are normal. Pupils are equal, round, and reactive to light. Right eye exhibits no discharge. Left eye exhibits no discharge. No scleral icterus.  Neck: Neck supple. No JVD present. No tracheal deviation present. No thyromegaly present.  Cardiovascular: Normal rate, regular rhythm, normal heart sounds and intact distal pulses.  Exam reveals no gallop and no friction rub.   No murmur heard. Pulmonary/Chest: Effort normal  and breath sounds normal. No respiratory distress. He has no wheezes. He has no rales. He exhibits no tenderness.  Abdominal: Soft. Bowel sounds are normal. He exhibits no distension and no mass. There is no tenderness. There is no rebound and no guarding.  Musculoskeletal: Normal range of motion. He exhibits no edema and no tenderness.  Lymphadenopathy:    He has no cervical adenopathy.  Neurological: He is alert and oriented to person, place, and time. He has normal reflexes. No cranial nerve deficit. He exhibits normal muscle tone. Coordination normal.  Skin: Skin is warm and dry. No rash noted. He is not diaphoretic. No erythema. No pallor.  Psychiatric: He has a normal mood and affect. His behavior is normal. Judgment and thought content normal.          Assessment & Plan:  Well exam. I think his cough is due to silent reflux, so we will start him back on Omeprazole 40 mg daily. Get a CXR today. He is due for another colonoscopy, so he will find out who did his last one ad get me this information so we can do a referral to see them again.

## 2013-11-16 NOTE — Progress Notes (Signed)
Pre visit review using our clinic review tool, if applicable. No additional management support is needed unless otherwise documented below in the visit note. 

## 2014-04-12 ENCOUNTER — Ambulatory Visit: Payer: Self-pay | Admitting: Family Medicine

## 2014-04-12 DIAGNOSIS — M21372 Foot drop, left foot: Secondary | ICD-10-CM | POA: Insufficient documentation

## 2014-04-21 DIAGNOSIS — Z7409 Other reduced mobility: Secondary | ICD-10-CM | POA: Insufficient documentation

## 2014-05-10 ENCOUNTER — Telehealth: Payer: Self-pay | Admitting: Family Medicine

## 2014-05-10 ENCOUNTER — Ambulatory Visit: Payer: Self-pay | Admitting: Family Medicine

## 2014-05-10 NOTE — Telephone Encounter (Signed)
I left a voice message for pt. Per Dr. Sarajane Jews pt should reschedule this follow up visit within 1 week. He was on our schedule today.

## 2014-05-21 ENCOUNTER — Ambulatory Visit: Payer: Self-pay | Admitting: Family Medicine

## 2014-07-12 ENCOUNTER — Ambulatory Visit: Payer: Self-pay | Admitting: Family Medicine

## 2014-12-15 ENCOUNTER — Ambulatory Visit (INDEPENDENT_AMBULATORY_CARE_PROVIDER_SITE_OTHER): Payer: BLUE CROSS/BLUE SHIELD | Admitting: Family Medicine

## 2014-12-15 ENCOUNTER — Encounter: Payer: Self-pay | Admitting: Family Medicine

## 2014-12-15 VITALS — BP 127/80 | HR 93 | Temp 98.7°F

## 2014-12-15 DIAGNOSIS — F329 Major depressive disorder, single episode, unspecified: Secondary | ICD-10-CM | POA: Diagnosis not present

## 2014-12-15 DIAGNOSIS — F32A Depression, unspecified: Secondary | ICD-10-CM

## 2014-12-15 DIAGNOSIS — C61 Malignant neoplasm of prostate: Secondary | ICD-10-CM

## 2014-12-15 DIAGNOSIS — M5440 Lumbago with sciatica, unspecified side: Secondary | ICD-10-CM

## 2014-12-15 LAB — HEPATIC FUNCTION PANEL
ALT: 31 U/L (ref 0–53)
AST: 20 U/L (ref 0–37)
Albumin: 4.5 g/dL (ref 3.5–5.2)
Alkaline Phosphatase: 114 U/L (ref 39–117)
Bilirubin, Direct: 0.1 mg/dL (ref 0.0–0.3)
Total Bilirubin: 0.5 mg/dL (ref 0.2–1.2)
Total Protein: 7.3 g/dL (ref 6.0–8.3)

## 2014-12-15 LAB — CBC WITH DIFFERENTIAL/PLATELET
Basophils Absolute: 0 10*3/uL (ref 0.0–0.1)
Basophils Relative: 0.1 % (ref 0.0–3.0)
Eosinophils Absolute: 0 10*3/uL (ref 0.0–0.7)
Eosinophils Relative: 0.2 % (ref 0.0–5.0)
HCT: 45.8 % (ref 39.0–52.0)
Hemoglobin: 14.8 g/dL (ref 13.0–17.0)
Lymphocytes Relative: 9.7 % — ABNORMAL LOW (ref 12.0–46.0)
Lymphs Abs: 0.7 10*3/uL (ref 0.7–4.0)
MCHC: 32.4 g/dL (ref 30.0–36.0)
MCV: 76.4 fl — ABNORMAL LOW (ref 78.0–100.0)
Monocytes Absolute: 0.5 10*3/uL (ref 0.1–1.0)
Monocytes Relative: 7.3 % (ref 3.0–12.0)
Neutro Abs: 6.2 10*3/uL (ref 1.4–7.7)
Neutrophils Relative %: 82.7 % — ABNORMAL HIGH (ref 43.0–77.0)
Platelets: 344 10*3/uL (ref 150.0–400.0)
RBC: 6 Mil/uL — ABNORMAL HIGH (ref 4.22–5.81)
RDW: 19.4 % — ABNORMAL HIGH (ref 11.5–15.5)
WBC: 7.5 10*3/uL (ref 4.0–10.5)

## 2014-12-15 LAB — POCT URINALYSIS DIPSTICK
Bilirubin, UA: NEGATIVE
Blood, UA: NEGATIVE
Glucose, UA: NEGATIVE
Ketones, UA: NEGATIVE
Leukocytes, UA: NEGATIVE
Nitrite, UA: NEGATIVE
Spec Grav, UA: 1.02
Urobilinogen, UA: 0.2
pH, UA: 5.5

## 2014-12-15 LAB — TSH: TSH: 0.84 u[IU]/mL (ref 0.35–4.50)

## 2014-12-15 LAB — BASIC METABOLIC PANEL
BUN: 12 mg/dL (ref 6–23)
CO2: 29 mEq/L (ref 19–32)
Calcium: 9.8 mg/dL (ref 8.4–10.5)
Chloride: 102 mEq/L (ref 96–112)
Creatinine, Ser: 0.69 mg/dL (ref 0.40–1.50)
GFR: 127.64 mL/min (ref >60.00)
Glucose, Bld: 100 mg/dL — ABNORMAL HIGH (ref 70–99)
Potassium: 4.2 mEq/L (ref 3.5–5.1)
Sodium: 140 mEq/L (ref 135–145)

## 2014-12-15 LAB — SEDIMENTATION RATE: Sed Rate: 14 mm/hr (ref 0–22)

## 2014-12-15 LAB — PSA: PSA: 0 ng/mL — ABNORMAL LOW (ref 0.10–4.00)

## 2014-12-15 MED ORDER — CITALOPRAM HYDROBROMIDE 20 MG PO TABS: 20.0000 mg | ORAL_TABLET | Freq: Every day | ORAL | Status: DC

## 2014-12-15 MED ORDER — HYDROCODONE-ACETAMINOPHEN 10-325 MG PO TABS: 1.0000 | ORAL_TABLET | Freq: Four times a day (QID) | ORAL | Status: DC | PRN

## 2014-12-15 NOTE — Progress Notes (Signed)
Pre visit review using our clinic review tool, if applicable. No additional management support is needed unless otherwise documented below in the visit note. Pt unable to weigh 

## 2014-12-15 NOTE — Progress Notes (Signed)
   Subjective:    Patient ID: [name], male    DOB: September 06, 1960, 54 y.o.   MRN: [id number]  HPI Here with his sister to follow up recent back surgery and to ask for help with some depression. On 06-14-14 he had a spinal fusion surgery at [facility] per Dr. Jenne Campus involving T10 through L4. He as had a rough recovery since then. This was an emergency surgery because he had rapidly developed a near paralysis of both legs prior tho this. He has been working hard through PT ever since, and he still cannot walk. He can stand briefly by using a walker. He has regained some movement in the left  leg and foot but he has very little control over the right leg and foot. His pain is fairly well controlled on Norco but he asks for refills. He says Dr. Harl Bowie has basically said he is finished with him unless something changes. He now says he has become depressed and struggles with keeping up is desire to work through his rehab. He feels sad and lacks motivation, especially since he has been such an athlete all is life. He played numerous sports as a teenager, and he was an award Public relations account executive for years. He has used Celexa in the past and did well, and he asks to try this again.    Review of Systems  Respiratory: Negative.   Cardiovascular: Negative.   Musculoskeletal: Positive for back pain and gait problem.  Neurological: Positive for weakness and numbness.  Psychiatric/Behavioral: Positive for dysphoric mood and decreased concentration. Negative for suicidal ideas, behavioral problems, confusion, sleep disturbance, self-injury and agitation. The patient is nervous/anxious.        Objective:   Physical Exam  Constitutional: He is oriented to person, place, and time. He appears well-developed and well-nourished.  In a wheelchair   Pulmonary/Chest: Effort normal and breath sounds normal.  Abdominal: Soft. Bowel sounds are normal.  Neurological: He is alert and oriented to  person, place, and time.  Psychiatric: He has a normal mood and affect. His behavior is normal. Judgment and thought content normal.          Assessment & Plan:  He is slowly recovering from spinal surgery and he making gradual progress. Hopefull he can regain the ability to walk at least. He is depressed so we will start him back on Celexa 20 mg daily. Pain meds were refilled. Recheck one month

## 2014-12-16 ENCOUNTER — Encounter: Payer: Self-pay | Admitting: Family Medicine

## 2014-12-16 ENCOUNTER — Telehealth: Payer: Self-pay | Admitting: Family Medicine

## 2014-12-16 NOTE — Telephone Encounter (Signed)
I already ordered these scans. He should get a call today or tomorrow about scheduling

## 2014-12-16 NOTE — Telephone Encounter (Signed)
Pt was seen yesterday and needs to proceed with ct scan from neck down to sacrum.

## 2014-12-17 ENCOUNTER — Encounter (HOSPITAL_COMMUNITY): Payer: Self-pay | Admitting: *Deleted

## 2014-12-17 ENCOUNTER — Emergency Department (HOSPITAL_COMMUNITY)
Admission: EM | Admit: 2014-12-17 | Discharge: 2014-12-17 | Disposition: A | Discharge status: Home / Self Care | Payer: BLUE CROSS/BLUE SHIELD | Attending: Emergency Medicine | Admitting: Emergency Medicine

## 2014-12-17 ENCOUNTER — Telehealth: Payer: Self-pay | Admitting: Family Medicine

## 2014-12-17 ENCOUNTER — Emergency Department (HOSPITAL_COMMUNITY): Payer: BLUE CROSS/BLUE SHIELD

## 2014-12-17 DIAGNOSIS — R531 Weakness: Secondary | ICD-10-CM | POA: Insufficient documentation

## 2014-12-17 DIAGNOSIS — R61 Generalized hyperhidrosis: Secondary | ICD-10-CM | POA: Diagnosis not present

## 2014-12-17 DIAGNOSIS — Z791 Long term (current) use of non-steroidal anti-inflammatories (NSAID): Secondary | ICD-10-CM | POA: Insufficient documentation

## 2014-12-17 DIAGNOSIS — Z7982 Long term (current) use of aspirin: Secondary | ICD-10-CM | POA: Insufficient documentation

## 2014-12-17 DIAGNOSIS — Z86010 Personal history of colonic polyps: Secondary | ICD-10-CM | POA: Diagnosis not present

## 2014-12-17 DIAGNOSIS — K219 Gastro-esophageal reflux disease without esophagitis: Secondary | ICD-10-CM | POA: Insufficient documentation

## 2014-12-17 DIAGNOSIS — Z9889 Other specified postprocedural states: Secondary | ICD-10-CM | POA: Insufficient documentation

## 2014-12-17 DIAGNOSIS — Z79899 Other long term (current) drug therapy: Secondary | ICD-10-CM | POA: Insufficient documentation

## 2014-12-17 DIAGNOSIS — M21372 Foot drop, left foot: Secondary | ICD-10-CM | POA: Diagnosis not present

## 2014-12-17 DIAGNOSIS — Z8546 Personal history of malignant neoplasm of prostate: Secondary | ICD-10-CM | POA: Diagnosis not present

## 2014-12-17 DIAGNOSIS — M545 Low back pain: Secondary | ICD-10-CM | POA: Insufficient documentation

## 2014-12-17 DIAGNOSIS — R200 Anesthesia of skin: Secondary | ICD-10-CM | POA: Diagnosis not present

## 2014-12-17 DIAGNOSIS — Z792 Long term (current) use of antibiotics: Secondary | ICD-10-CM | POA: Insufficient documentation

## 2014-12-17 DIAGNOSIS — M549 Dorsalgia, unspecified: Secondary | ICD-10-CM

## 2014-12-17 LAB — CBC WITH DIFFERENTIAL/PLATELET
Basophils Absolute: 0 10*3/uL (ref 0.0–0.1)
Basophils Relative: 0 %
Eosinophils Absolute: 0 10*3/uL (ref 0.0–0.7)
Eosinophils Relative: 0 %
HCT: 41.9 % (ref 39.0–52.0)
Hemoglobin: 13.9 g/dL (ref 13.0–17.0)
Lymphocytes Relative: 6 %
Lymphs Abs: 0.6 10*3/uL — ABNORMAL LOW (ref 0.7–4.0)
MCH: 25 pg — ABNORMAL LOW (ref 26.0–34.0)
MCHC: 33.2 g/dL (ref 30.0–36.0)
MCV: 75.4 fL — ABNORMAL LOW (ref 78.0–100.0)
Monocytes Absolute: 0.5 10*3/uL (ref 0.1–1.0)
Monocytes Relative: 5 %
Neutro Abs: 7.8 10*3/uL — ABNORMAL HIGH (ref 1.7–7.7)
Neutrophils Relative %: 89 %
Platelets: 293 10*3/uL (ref 150–400)
RBC: 5.56 MIL/uL (ref 4.22–5.81)
RDW: 17.5 % — ABNORMAL HIGH (ref 11.5–15.5)
WBC: 8.9 10*3/uL (ref 4.0–10.5)

## 2014-12-17 LAB — I-STAT CG4 LACTIC ACID, ED: Lactic Acid, Venous: 1.05 mmol/L (ref 0.5–2.0)

## 2014-12-17 LAB — URINALYSIS, ROUTINE W REFLEX MICROSCOPIC
Bilirubin Urine: NEGATIVE
Glucose, UA: NEGATIVE mg/dL
Hgb urine dipstick: NEGATIVE
Ketones, ur: NEGATIVE mg/dL
Leukocytes, UA: NEGATIVE
Nitrite: NEGATIVE
Protein, ur: NEGATIVE mg/dL
Specific Gravity, Urine: 1.011 (ref 1.005–1.030)
Urobilinogen, UA: 0.2 mg/dL (ref 0.0–1.0)
pH: 6 (ref 5.0–8.0)

## 2014-12-17 LAB — BASIC METABOLIC PANEL
Anion gap: 8 (ref 5–15)
BUN: 13 mg/dL (ref 6–20)
CO2: 25 mmol/L (ref 22–32)
Calcium: 9.4 mg/dL (ref 8.9–10.3)
Chloride: 105 mmol/L (ref 101–111)
Creatinine, Ser: 0.57 mg/dL — ABNORMAL LOW (ref 0.61–1.24)
GFR calc Af Amer: >60 mL/min (ref >60)
GFR calc non Af Amer: >60 mL/min (ref >60)
Glucose, Bld: 113 mg/dL — ABNORMAL HIGH (ref 65–99)
Potassium: 3.9 mmol/L (ref 3.5–5.1)
Sodium: 138 mmol/L (ref 135–145)

## 2014-12-17 LAB — SEDIMENTATION RATE: Sed Rate: 8 mm/hr (ref 0–16)

## 2014-12-17 LAB — C-REACTIVE PROTEIN: CRP: <0.5 mg/dL (ref <1.0)

## 2014-12-17 MED ORDER — GADOBENATE DIMEGLUMINE 529 MG/ML IV SOLN
20.0000 mL | Freq: Once | INTRAVENOUS | Status: AC | PRN
  Administered 2014-12-17: 20 mL via INTRAVENOUS

## 2014-12-17 MED ORDER — DIAZEPAM 5 MG PO TABS
5.0000 mg | ORAL_TABLET | Freq: Once | ORAL | Status: AC
  Administered 2014-12-17: 5 mg via ORAL
  Filled 2014-12-17: qty 1

## 2014-12-17 MED ORDER — HYDROCODONE-ACETAMINOPHEN 5-325 MG PO TABS
2.0000 | ORAL_TABLET | Freq: Once | ORAL | Status: AC
  Administered 2014-12-17: 2 via ORAL
  Filled 2014-12-17: qty 2

## 2014-12-17 MED ORDER — HYDROMORPHONE HCL 1 MG/ML IJ SOLN
1.0000 mg | Freq: Once | INTRAMUSCULAR | Status: DC
  Filled 2014-12-17: qty 1

## 2014-12-17 MED ORDER — ONDANSETRON HCL 4 MG/2ML IJ SOLN
4.0000 mg | Freq: Once | INTRAMUSCULAR | Status: AC
  Administered 2014-12-17: 4 mg via INTRAVENOUS
  Filled 2014-12-17: qty 2

## 2014-12-17 NOTE — ED Notes (Signed)
Pt requested that MD be notified of additional information.  He remembered that he had a steroid injection in [place] ([place]) just prior to the water therapy.  Also, he is scheduled for a CT scan next week, could we do that while he is here today.  Spoke with Dr. Betsey Holiday, information acknowledged regarding the steroid injection.  We cannot do the CT scan while he is here.  MRI has been ordered.  I advised patient of MD's response.

## 2014-12-17 NOTE — ED Notes (Signed)
Back pain increasing last 3-4 weeks Night sweats began around 11/22/2014

## 2014-12-17 NOTE — ED Notes (Signed)
Bed: AJ68 Expected date:  Expected time:  Means of arrival:  Comments: EMS- 54yo M, back pain/anxiety/fatigue

## 2014-12-17 NOTE — Telephone Encounter (Signed)
Pt is currently at [facility].

## 2014-12-17 NOTE — Discharge Instructions (Signed)

## 2014-12-17 NOTE — Telephone Encounter (Signed)
Patient Name: [name]  DOB: 01-Oct-1960    Initial Comment Caller states for 3 wks he has had night sweats and cramps, bp is high, SOB, legs are swelling.   Nurse Assessment  Nurse: Mallie Mussel, RN, Alveta Heimlich Date/Time Eilene Ghazi Time): 12/17/2014 10:49:58 AM  Confirm and document reason for call. If symptomatic, describe symptoms. ---Caller states that he has SOB and his legs are swelling. He is speaking in complete sentences. He has SOB with rest. The swelling of his legs goes into the calves of his legs, but not past the knee. He states that his legs feel like they weigh 1000lbs. He has had blood clots in his leg before.  Has the patient traveled out of the country within the last 30 days? ---No  Does the patient have any new or worsening symptoms? ---Yes  Will a triage be completed? ---Yes  Related visit to physician within the last 2 weeks? ---No  Does the PT have any chronic conditions? (i.e. diabetes, asthma, etc.) ---Yes  List chronic conditions. ---Chronic Back Problems     Guidelines    Guideline Title Affirmed Question Affirmed Notes  Leg Swelling and Edema Difficulty breathing at rest    Final Disposition User   Go to ED Now Mallie Mussel, RN, [facility] - ED   Disagree/Comply: Comply

## 2014-12-17 NOTE — ED Notes (Signed)
Patient transported to MRI 

## 2014-12-17 NOTE — ED Notes (Signed)
Per EMS pt from home c/o of anxiety, back pain and night sweats. Pt reports numerous back surgery and back is fused from T-10 to S-1. For past 3-4 weeks patients says back pain has increased and is not generally feeling well. Denies cough/cold.

## 2014-12-17 NOTE — ED Notes (Signed)
Nurse currently starting IV, getting 2nd set of blood cultures

## 2014-12-17 NOTE — ED Provider Notes (Signed)
CSN: [id number]     Arrival date & time 12/17/14  1146 History   First MD Initiated Contact with Patient 12/17/14 1157     Chief Complaint  Patient presents with  . Back Pain     (Consider location/radiation/quality/duration/timing/severity/associated sxs/prior Treatment) HPI Comments: Patient presents to the emergency department for evaluation of increasing back pain and leg weakness. Patient reports that he has had 2 back surgeries at [facility] this year. Over the last 1-1/2 months he has had progressive worsening of his leg weakness. He has chronic numbness, tingling and decreased sensation from the knees down bilaterally with a left foot drop. This is unchanged. In the last 3 weeks he has been experiencing progressively worsening pain in the center of his lumbar region and he has been experiencing night sweats. This started around the time he went back to physical therapy and started doing water therapy, his concerned about possible infection. He also had a steroid injection in his back right before the symptoms worsened as well.  Patient is a 54 y.o. male presenting with back pain.  Back Pain   Past Medical History  Diagnosis Date  . Colon polyps   . Depression   . GERD (gastroesophageal reflux disease)   . Arthritis   . Osteopenia   . Low back pain   . Degenerative disc disease, cervical   . Degenerative disc disease, lumbar   . Cancer [facility])     prostate, sees Dr. Risa Grill    Past Surgical History  Procedure Laterality Date  . Cystectomy      benign, right hip  . Laminectomy  2/09    lumbar  . Gynecomastia excision      bilateral, reduction  . Lumbar microdiscectomy  10/15/08    L-4-5, per Dr. Jenne Campus at [facility]  . Prostate surgery  12-13-10    robotic prostatectomy per Dr. Risa Grill   . Joint replacement  12-20-11    hx. LTHA, now RTHA planned  . Total hip arthroplasty  12-20-11    2010 left hip  . Total hip arthroplasty  12/26/2011    Procedure:  TOTAL HIP ARTHROPLASTY;  Surgeon: Gearlean Alf, MD;  Location: [facility];  Service: Orthopedics;  Laterality: Right;  . Lumbar fusion  2015    L4 through S1, per Dr. Harl Bowie   . Cervical fusion  2015    C3 through C7, per Dr. Harl Bowie   . Colonoscopy  2010    per a GI in [place], clear    Family History  Problem Relation Age of Onset  . Arthritis    . Hypertension     Social History  Substance Use Topics  . Smoking status: Never Smoker   . Smokeless tobacco: Never Used  . Alcohol Use: 0.0 oz/week    0 Standard drinks or equivalent per week     Comment: rare    Review of Systems  Constitutional: Positive for diaphoresis.  Musculoskeletal: Positive for back pain.  All other systems reviewed and are negative.     Allergies  Flexeril; Oxycodone; and Oxycodone-acetaminophen  Home Medications   Prior to Admission medications   Medication Sig Start Date End Date Taking? Authorizing Provider  amoxicillin (AMOXIL) 875 MG tablet Take 875 mg by mouth 2 (two) times daily. Prior to dental appointment 05/26/13  Yes Lucretia Kern, DO  aspirin 81 MG tablet Take 81 mg by mouth daily.   Yes Historical Provider, MD  citalopram (CELEXA) 20 MG tablet Take 1 tablet (  20 mg total) by mouth daily. 12/15/14  Yes Laurey Morale, MD  diazepam (VALIUM) 10 MG tablet Take 10 mg by mouth every 6 (six) hours as needed for anxiety.   Yes Historical Provider, MD  diclofenac (VOLTAREN) 75 MG EC tablet Take 1 tablet (75 mg total) by mouth 2 (two) times daily. 11/16/13  Yes Laurey Morale, MD  HYDROcodone-acetaminophen (NORCO) 10-325 MG tablet Take 1 tablet by mouth every 6 (six) hours as needed for moderate pain. 12/15/14  Yes Laurey Morale, MD  Multiple Vitamin (MULTIVITAMIN WITH MINERALS) TABS tablet Take 1 tablet by mouth daily.   Yes Historical Provider, MD  polyethylene glycol (MIRALAX / GLYCOLAX) packet Take 17 g by mouth daily. As needed to help prevent constipation (available over the counter) 12/01/12   Yes Historical Provider, MD  TURMERIC PO Take 2 tablets by mouth daily.   Yes Historical Provider, MD  valACYclovir (VALTREX) 500 MG tablet Take 4 tablets by mouth daily as needed. Cold sores. 11/18/12  Yes Historical Provider, MD  omeprazole (PRILOSEC) 40 MG capsule Take 1 capsule (40 mg total) by mouth daily. Patient not taking: Reported on 12/15/2014 11/16/13   Laurey Morale, MD   BP 134/76 mmHg  Pulse 94  Temp(Src) 98.1 F (36.7 C) (Oral)  Resp 14  Ht 5\' 10"  (1.778 m)  Wt 215 lb (97.523 kg)  BMI 30.85 kg/m2  SpO2 99% Physical Exam  Constitutional: He is oriented to person, place, and time. He appears well-developed and well-nourished. No distress.  HENT:  Head: Normocephalic and atraumatic.  Right Ear: Hearing normal.  Left Ear: Hearing normal.  Nose: Nose normal.  Mouth/Throat: Oropharynx is clear and moist and mucous membranes are normal.  Eyes: Conjunctivae and EOM are normal. Pupils are equal, round, and reactive to light.  Neck: Normal range of motion. Neck supple.  Cardiovascular: Regular rhythm, S1 normal and S2 normal.  Exam reveals no gallop and no friction rub.   No murmur heard. Pulmonary/Chest: Effort normal and breath sounds normal. No respiratory distress. He exhibits no tenderness.  Abdominal: Soft. Normal appearance and bowel sounds are normal. There is no hepatosplenomegaly. There is no tenderness. There is no rebound, no guarding, no tenderness at McBurney's point and negative Murphy's sign. No hernia.  Musculoskeletal: Normal range of motion.  Neurological: He is alert and oriented to person, place, and time. He has normal strength. A sensory deficit (Bilateral lower legs, left greater than right) is present. No cranial nerve deficit. He exhibits abnormal muscle tone (Left lower extremity weakness and foot drop). Coordination normal. GCS eye subscore is 4. GCS verbal subscore is 5. GCS motor subscore is 6.  Skin: Skin is warm, dry and intact. No rash noted. No  cyanosis.  Psychiatric: He has a normal mood and affect. His speech is normal and behavior is normal. Thought content normal.  Nursing note and vitals reviewed.   ED Course  Procedures (including critical care time) Labs Review Labs Reviewed  CBC WITH DIFFERENTIAL/PLATELET - Abnormal; Notable for the following:    MCV 75.4 (*)    MCH 25.0 (*)    RDW 17.5 (*)    Neutro Abs 7.8 (*)    Lymphs Abs 0.6 (*)    All other components within normal limits  BASIC METABOLIC PANEL - Abnormal; Notable for the following:    Glucose, Bld 113 (*)    Creatinine, Ser 0.57 (*)    All other components within normal limits  CULTURE, BLOOD (ROUTINE X  2)  CULTURE, BLOOD (ROUTINE X 2)  URINE CULTURE  URINALYSIS, ROUTINE W REFLEX MICROSCOPIC (NOT AT [facility])  SEDIMENTATION RATE  C-REACTIVE PROTEIN  I-STAT CG4 LACTIC ACID, ED    Imaging Review Mr Lumbar Spine W Wo Contrast  12/17/2014  CLINICAL DATA:  Spinal fusion with back pain and night sweats. EXAM: MRI LUMBAR SPINE WITHOUT AND WITH CONTRAST TECHNIQUE: Multiplanar and multiecho pulse sequences of the lumbar spine were obtained without and with intravenous contrast. CONTRAST:  67mL MULTIHANCE GADOBENATE DIMEGLUMINE 529 MG/ML IV SOLN COMPARISON:  10/16/2006 FINDINGS: T10 through S1 posterior rod and pedicle screw fixation with discectomies at L3-4, L4-5, L5-S1. Bony fusion is completed Y8-1 and uncertain across the K4-8 and L5-S1 disc spaces. Hardware causes extensive artifact. No usual fibrosis. No indication of arachnoiditis. At the L1-2 level there is endplate irregularity on the left with associated anterior and left lateral disc and endplate enhancement and disc hyperintensity. Given partial involvement of the endplates, degenerative changes are favored over infection. No associated edema or enhancement within the paraspinous musculature. Normal conus signal and morphology. No notable retroperitoneal findings. No fluid collection within the incision.  Degenerative changes: T12- L1: Posterior fusion with no indication of canal or foraminal stenosis. L1-L2: Posterior fusion with patent spinal canal. Residual endplate ridging on the left likely with persistent mild foraminal stenosis. L2-L3: Posterior fusion. No evidence of canal or foraminal stenosis. L3-L4: Discectomy and posterior fusion. No evidence of recurrent stenosis. L4-L5: Discectomy and posterior fusion. No evidence of recurrent stenosis. L5-S1:Discectomy and posterior fusion. Persistent disc narrowing and endplate spurring causes bilateral foraminal stenosis, more advanced on the left where spurs impinge on the exiting L5 nerve. IMPRESSION: 1. L1-2 endplate erosions and disc enhancement could be degenerative or from early discitis. No abscess. 2. T10 through S1 posterior fusion with lower lumbar discectomies, as above. Electronically Signed   By: Monte Fantasia M.D.   On: 12/17/2014 15:13   I have personally reviewed and evaluated these images and lab results as part of my medical decision-making.   EKG Interpretation None      MDM   Final diagnoses:  Back pain    Patient presents to the ER for evaluation of pain in his back and heaviness in his legs. Patient has had a complicated surgical history. He had back surgery at [facility] around May of this year. He required repeat surgery a month later because of new ruptured disc in neurologic symptoms. Since then he has had significant deficit and inability to walk, specifically because of left foot drop. Patient did have an epidural injection proximally a month ago. He reports he has had some night sweats, but has not documented any fevers. Infection was considered. MRI was performed. There does not appear to be any residual stenosis or significant problems on the MRI. There was either degenerative changes or possible discitis noted at L1-L2. Patient is afebrile. He does not have a white count. Sedimentation rate is 8. This was discussed with  Dr. Sherley Bounds, on-call for neurosurgery. He was able to review the images and does not feel that the patient has discitis and does not recommend treatment for infection or antibiotics at this time. Symptoms are likely postsurgical. Will refer back to primary care doctor.    Orpah Greek, MD 12/17/14 [phone]

## 2014-12-19 LAB — URINE CULTURE: Culture: NO GROWTH

## 2014-12-20 ENCOUNTER — Telehealth: Payer: Self-pay | Admitting: Family Medicine

## 2014-12-20 ENCOUNTER — Encounter: Payer: Self-pay | Admitting: Family Medicine

## 2014-12-20 DIAGNOSIS — M542 Cervicalgia: Secondary | ICD-10-CM

## 2014-12-20 NOTE — Telephone Encounter (Signed)
Pt wanted DR Sarajane Jews  to know that he went to [place] Friday 10/14 with the same symptoms as he had when he saw you. they did labs, which were normal, and a culture which he has not heard. They also did MRI on his back, no infection. Pt has a CT Friday and they are t1 to S1 Pt would like to know if you can change the ct acan to go all the way uo from C1 to S1 because he has hardeware in his neck as well. Looking for a hardware that make have come loose. pls advise if OK to change CT scan order. Pt would like to know if this CT can be STAT because pt states still having the symptoms this am and gets worse throughout the day.  Please be advised there was already documentation on this issue.  Pt following up.

## 2014-12-21 NOTE — Telephone Encounter (Signed)
I sent pt a my chart message, this was a duplicate note.

## 2014-12-21 NOTE — Telephone Encounter (Signed)
I did add a CT scan of his cervical spine for Friday. This will NOT be stat, he can wait for Friday

## 2014-12-21 NOTE — Telephone Encounter (Signed)
I habve the scans set for Friday, that is the best we can do. Yes Gabapentin may be helpful. Take 300 mg TID. If he needs more, call in #[phone] with 2 rf

## 2014-12-22 LAB — CULTURE, BLOOD (ROUTINE X 2)
Culture: NO GROWTH
Culture: NO GROWTH

## 2014-12-22 NOTE — Telephone Encounter (Signed)
Looks like a FYI 

## 2014-12-22 NOTE — Telephone Encounter (Signed)
Take the Gabapentin TID. His GFR is normal, we only worry about low numbers. We need to wait on the CT results before we consider any other referrals.

## 2014-12-23 NOTE — Telephone Encounter (Signed)
Yes all 3 scans are set up for tomorrow

## 2014-12-24 ENCOUNTER — Ambulatory Visit (INDEPENDENT_AMBULATORY_CARE_PROVIDER_SITE_OTHER)
Admission: RE | Admit: 2014-12-24 | Discharge: 2014-12-24 | Disposition: A | Discharge status: Home / Self Care | Payer: BLUE CROSS/BLUE SHIELD | Source: Ambulatory Visit | Attending: Family Medicine | Admitting: Family Medicine

## 2014-12-24 ENCOUNTER — Other Ambulatory Visit: Payer: Self-pay | Admitting: Family Medicine

## 2014-12-24 DIAGNOSIS — M5440 Lumbago with sciatica, unspecified side: Secondary | ICD-10-CM

## 2014-12-24 DIAGNOSIS — M542 Cervicalgia: Secondary | ICD-10-CM

## 2014-12-24 MED ORDER — IOHEXOL 300 MG/ML  SOLN
100.0000 mL | Freq: Once | INTRAMUSCULAR | Status: AC | PRN
  Administered 2014-12-24: 100 mL via INTRAVENOUS

## 2014-12-27 ENCOUNTER — Other Ambulatory Visit: Payer: Self-pay | Admitting: Family Medicine

## 2014-12-27 ENCOUNTER — Encounter: Payer: Self-pay | Admitting: Family Medicine

## 2014-12-31 ENCOUNTER — Ambulatory Visit (INDEPENDENT_AMBULATORY_CARE_PROVIDER_SITE_OTHER): Payer: BLUE CROSS/BLUE SHIELD | Admitting: Neurology

## 2014-12-31 ENCOUNTER — Encounter: Payer: Self-pay | Admitting: Neurology

## 2014-12-31 ENCOUNTER — Other Ambulatory Visit (INDEPENDENT_AMBULATORY_CARE_PROVIDER_SITE_OTHER): Payer: BLUE CROSS/BLUE SHIELD

## 2014-12-31 VITALS — BP 120/80 | HR 96 | Ht 71.0 in | Wt 215.0 lb

## 2014-12-31 DIAGNOSIS — M5106 Intervertebral disc disorders with myelopathy, lumbar region: Secondary | ICD-10-CM

## 2014-12-31 DIAGNOSIS — M792 Neuralgia and neuritis, unspecified: Secondary | ICD-10-CM

## 2014-12-31 DIAGNOSIS — G909 Disorder of the autonomic nervous system, unspecified: Secondary | ICD-10-CM | POA: Diagnosis not present

## 2014-12-31 LAB — VITAMIN B12: Vitamin B-12: 463 pg/mL (ref 211–911)

## 2014-12-31 MED ORDER — GABAPENTIN 300 MG PO CAPS
ORAL_CAPSULE | ORAL | Status: DC
Start: 2014-12-31 — End: 2015-02-04

## 2014-12-31 NOTE — Progress Notes (Signed)
[facility] Neurology Division Clinic Note - Initial Visit   Date: 01/03/2015  [name] MRN: [id number] DOB: 02/03/1961   Dear Dr. Clent Ridges:  Thank you for your kind referral of [name] for consultation of low back pain. Although his history is well known to you, please allow [place] to reiterate it for the purpose of our medical record. The patient was accompanied to the clinic by self.    History of Present Illness: [name] is a 54 y.o. right-handed Caucasian male with depression, prostate cancer s/p prostatectomy (2011), GERD, chronic low back pain s/p T10-L4 fusion presenting for evaluation of abnormal sensation of the chest.    Patient was previously very active and Visual merchandiser.  Unfortunately, the past several years ave been tumulteous medically and he has underwent a neck and back surgery by Dr. Marcina Millard at [facility].  In July 2015, he had anterior cervical diskectomy and fusion from C3-C7 due to right arm weakness and bilateral hand paresthesias.  In early 2016, he acutely herniated his L3 disc requiring emergent surgery due to progressive weakness of the legs with bilateral foot drop.  He underwent s/p posterior lumbar interbody fusion at L2-L4 performed on 04-15-14 and re-do in April 2016 due to hardware becoming loose where he now has fusion from T10-L4.  He was discharged to rehab facility and returned home in May with wheelchair and doing out-patient PT.  He reports having bilateral drop foot and numbness from the knee down but spares the right foot, but his right foot drop slowly improved but continues to have persistent left foot drop and numbness of the entire left lower leg. He was making good progress with leg strength until July, but sustained a fall going up steps and since then he has been getting worse.  He feels that his legs are heavy and gets fatigued early.    Because of his leg weakness, his surgeon recommended him to see Dr. Estella Husk who  performed NCS/EMG which showed active on chronic multilevel radiculopathy affecting the cervical and lumbar region as well as neuropathy.  He adminsitered a steroid injection to his back, but this did not help.  Since then, he has had significant difficult recovery process and still has markedly left foot drop, gait difficulty, numbness of the legs bilaterally, and lack of endurance.  He underwent imaging of his cervical, thoracic, and lumbar spine which showed multilevel foraminal stenosis worse in the cervical spine, where it is severe at C5-6 and C6-7 on the right.     In October 2016, he woke up with burning sensation over his chest, neck, and abdomen.  This was followed by cold sensation and clamminess.  This lasts about 15-20 minutes and would several times per day.  He went to the emergency department because of spells the chest discomfort.  He had imaging of the lumbar spine which did not show any acute changes, only known post-surgical changes.   He was doing water therapy last 10/4 and has been getting accupucture.  He heard that vitamin B6 can be helpful, so was taking this for several months until he was recommended to stop this by his neurologist.  He does not complain of significant pain in his legs.  He feeling depression because he is unable to function at the level he was able to do so previously and was started on Celexa earlier this month.   Out-side paper records, electronic medical record, and images have been reviewed where available  and summarized as:  EMG of the left arm and leg 11/04/2014 performed by Dr. Estella Husk at [facility]: 1.  Multilevel lumbosacral radiculopathy, acute on chronic.  L5 >S1 > L4 2.  No evidence of left peroneal mononeuropathy, absent superficial peroneal on the left is due to radiculopathy 3.  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 4.  Moderate bilateral medial mononeuropathy at the wrist 5.   Multilevel cervical radiculopathy, acute on chronic C8 > C7 > C6  CT cervical, thoracic, lumbar spine 12/24/2014: 1. Prior C3-C7 ACDF without significant residual spinal stenosis at these levels. 2. Moderate to severe multilevel cervical foraminal stenosis as above. 3. Prior T10-S1 fusion. No CT evidence of spinal infection or hardware complication. 4. Solid intervertebral osseous fusion at L4-5 and likely at L5-S1. Solid fusion is not yet identified at L3-4. 5. Relatively diffuse thoracic disc and facet degeneration with moderate to severe multilevel foraminal stenosis as above. 6. Mild residual neural foraminal narrowing in the lumbar spine as above.  MRI lumbar spine wwo contrast 12/17/2014 1. L1-2 endplate erosions and disc enhancement could be degenerative or from early discitis. No abscess. 2. T10 through S1 posterior fusion with lower lumbar discectomies, as above.  Labs 12/2014:  ESR 8, TSH 0.84  Past Medical History  Diagnosis Date  . Colon polyps   . Depression   . GERD (gastroesophageal reflux disease)   . Arthritis   . Osteopenia   . Low back pain   . Degenerative disc disease, cervical   . Degenerative disc disease, lumbar   . Cancer [facility])     prostate, sees Dr. Isabel Caprice     Past Surgical History  Procedure Laterality Date  . Cystectomy      benign, right hip  . Laminectomy  2/09    lumbar  . Gynecomastia excision      bilateral, reduction  . Lumbar microdiscectomy  10/15/08    L-4-5, per Dr. Donette Larry at [facility]  . Prostate surgery  12-13-10    robotic prostatectomy per Dr. Isabel Caprice   . Joint replacement  12-20-11    hx. LTHA, now RTHA planned  . Total hip arthroplasty  12-20-11    2010 left hip  . Total hip arthroplasty  12/26/2011    Procedure: TOTAL HIP ARTHROPLASTY;  Surgeon: Loanne Drilling, MD;  Location: [facility] ORS;  Service: Orthopedics;  Laterality: Right;  . Lumbar fusion  2015    L4 through S1, per Dr. Wyline Mood   . Cervical fusion   2015    C3 through C7, per Dr. Wyline Mood   . Colonoscopy  2010    per a GI in [place], clear      Medications:  Outpatient Encounter Prescriptions as of 12/31/2014  Medication Sig Note  . amoxicillin (AMOXIL) 875 MG tablet Take 875 mg by mouth 2 (two) times daily. Prior to dental appointment   . aspirin 81 MG tablet Take 81 mg by mouth daily.   . citalopram (CELEXA) 20 MG tablet Take 1 tablet (20 mg total) by mouth daily. 12/17/2014: Just started medication 12/16/14. Has had two doses.   . diazepam (VALIUM) 10 MG tablet Take 10 mg by mouth every 6 (six) hours as needed for anxiety.   . diclofenac (VOLTAREN) 75 MG EC tablet TAKE ONE TABLET TWICE DAILY   . HYDROcodone-acetaminophen (NORCO) 10-325 MG tablet Take 1 tablet by mouth every 6 (six) hours as needed for moderate pain.   . Multiple Vitamin (MULTIVITAMIN WITH MINERALS)  TABS tablet Take 1 tablet by mouth daily.   . polyethylene glycol (MIRALAX / GLYCOLAX) packet Take 17 g by mouth daily. As needed to help prevent constipation (available over the counter)   . valACYclovir (VALTREX) 500 MG tablet Take 4 tablets by mouth daily as needed. Cold sores.   . gabapentin (NEURONTIN) 300 MG capsule Take 1 tablet at bedtime for one week, then increase to 1 tablet twice daily.   . [DISCONTINUED] omeprazole (PRILOSEC) 40 MG capsule Take 1 capsule (40 mg total) by mouth daily. (Patient not taking: Reported on 12/15/2014)   . [DISCONTINUED] TURMERIC PO Take 2 tablets by mouth daily.    No facility-administered encounter medications on file as of 12/31/2014.     Allergies:  Allergies  Allergen Reactions  . Flexeril [Cyclobenzaprine]     Weird feeling  . Oxycodone     Foggy thinking  . Oxycodone-Acetaminophen Nausea Only    dizziness    Family History: Family History  Problem Relation Age of Onset  . Arthritis    . Hypertension Father   . Suicidality Father   . Healthy Mother   . Cancer Brother     Bone cancer    Social  History: Social History  Substance Use Topics  . Smoking status: Never Smoker   . Smokeless tobacco: Never Used  . Alcohol Use: 0.0 oz/week    0 Standard drinks or equivalent per week     Comment: rare   Social History   Social History Narrative   Lives alone in a one story home.  No children.     On disability since September 2016.  He was previously working in Consulting civil engineer.    Education: high school.    Review of Systems:  CONSTITUTIONAL: No fevers, chills, night sweats, or weight loss.   EYES: No visual changes or eye pain ENT: No hearing changes.  No history of nose bleeds.   RESPIRATORY: No cough, wheezing and shortness of breath.   CARDIOVASCULAR: Negative for chest pain, and palpitations.   GI: Negative for abdominal discomfort, blood in stools or black stools.  No recent change in bowel habits.   GU:  No history of incontinence.   MUSCLOSKELETAL: +history of joint pain or swelling.  No myalgias.   SKIN: Negative for lesions, rash, and itching.   HEMATOLOGY/ONCOLOGY: Negative for prolonged bleeding, bruising easily, and swollen nodes.  No history of cancer.   ENDOCRINE: Negative for cold or heat intolerance, polydipsia or goiter.   PSYCH:  +depression or anxiety symptoms.   NEURO: As Above.   Vital Signs:  BP 120/80 mmHg  Pulse 96  Ht 5\' 11"  (1.803 m)  Wt 215 lb (97.523 kg)  BMI 30.00 kg/m2  SpO2 97%   General Medical Exam:   General:  Well appearing, comfortable, sitting in wheelchair.   Eyes/ENT: see cranial nerve examination.   Neck: No masses appreciated.  Full range of motion without tenderness.  No carotid bruits. Respiratory:  Clear to auscultation, good air entry bilaterally.   Cardiac:  Regular rate and rhythm, no murmur.   Extremities:  No deformities, edema, or skin discoloration.  Skin:  No rashes or lesions.  Neurological Exam: MENTAL STATUS including orientation to time, place, person, recent and remote memory, attention span and concentration, language,  and fund of knowledge is normal.  Speech is not dysarthric.  CRANIAL NERVES: II:  No visual field defects.  Unremarkable fundi.   III-IV-VI: Pupils equal round and reactive to light.  Normal conjugate,  extra-ocular eye movements in all directions of gaze.  No nystagmus.    V:  Normal facial sensation.    VII:  Normal facial symmetry and movements.  VIII:  Normal hearing and vestibular function.   IX-X:  Normal palatal movement.   XI:  Normal shoulder shrug and head rotation.   XII:  Normal tongue strength and range of motion, no deviation or fasciculation.  MOTOR:  Moderate left TA atrophy and atrophy of the left intrinsic foot muscles.  Occasional fasciculations of the calf bilaterally.  No pronator drift.  Tone is normal.    Right Upper Extremity:    Left Upper Extremity:    Deltoid  5/5   Deltoid  5/5   Biceps  5/5   Biceps  5/5   Triceps  5/5   Triceps  5/5   Wrist extensors  5/5   Wrist extensors  5/5   Wrist flexors  5/5   Wrist flexors  5/5   Finger extensors  5/5   Finger extensors  5/5   Finger flexors  5/5   Finger flexors  5/5   Dorsal interossei  5/5   Dorsal interossei  5/5   Abductor pollicis  5/5   Abductor pollicis  5/5   Tone (Ashworth scale)  0  Tone (Ashworth scale)  0   Right Lower Extremity:    Left Lower Extremity:    Hip flexors  5/5   Hip flexors  5/5   Hip extensors  5/5   Hip extensors  5/5   Knee flexors  5/5   Knee flexors  5/5   Knee extensors  5/5   Knee extensors  5/5   Dorsiflexors  5/5   Dorsiflexors  2/5   Plantarflexors  5/5   Plantarflexors  5/5   Inversion 5/5  Inversion 3/5  Eversion 5/5  Eversion 2/5  Toe extensors  5/5   Toe extensors  2/5   Toe flexors  5/5   Toe flexors  5/5   Tone (Ashworth scale)  0  Tone (Ashworth scale)  0   MSRs:  Right                                                                 Left brachioradialis 2+  brachioradialis 2+  biceps 2+  biceps 2+  triceps 2+  triceps 2+  patellar 2+  patellar trace  ankle  jerk 1+  ankle jerk 0  Hoffman no  Hoffman no  plantar response down  plantar response down   SENSORY:  Sensation to all modalities reduced distal to knees bilaterally.  COORDINATION/GAIT: Normal finger-to- nose-finger.  Intact rapid alternating movements bilaterally.  Unable to rise from a chair without using arms.  Patient able to stand unassisted, but gait not tested due to instability.   IMPRESSION: 1.  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  Symptoms manifesting with sensory disturbance and without hemodynamic instability Pharmacological management is difficult and there are limited data available to guide decision making. Centrally acting drugs, especially those with sympathetic activity (alpha agonists and some beta blockers) can be helpful.  Because there is no associated hemodynamical instability and symptoms are transient, will offer a  trial of gabapentin for paresthesias.  2.  S/p T10-L4 instrumented fusion (06/2014) and s/p posterior lumbar interbody fusion at L2-L4 (04/2014) Residual left foot drop and bilateral leg numbness below the knees  3.  s/p anterior cervical diskectomy and fusion from C3-C7  (09/2013)    PLAN/RECOMMENDATIONS:  1.  Check vitamin B12, copper, zinc 2.  Start gabapentin 300mg  at bedtime x 1 week, then increase to 300mg  BID 3.  Start home PT, fit for left AFO 4.  Stop vitamin B6 supplements  Return to clinic in 3 months.   The duration of this appointment visit was 65 minutes of face-to-face time with the patient.  Greater than 50% of this time was spent in counseling, explanation of diagnosis, planning of further management, and coordination of care.   Thank you for allowing me to participate in patient's care.  If I can answer any additional questions, I would be pleased to do so.    Sincerely,    Camora Tremain K. Allena Katz, DO

## 2014-12-31 NOTE — Patient Instructions (Addendum)
1. Start gabapentin 300mg  at bedtime for one week, then increase to 300mg  twice daily. 2. Start home therapy 3. Check blood work 4. Return to clinic 42-months

## 2015-01-03 LAB — COPPER, SERUM: Copper: 98 ug/dL (ref 70–175)

## 2015-01-03 LAB — ZINC: Zinc: 83 ug/dL (ref 60–130)

## 2015-01-04 ENCOUNTER — Telehealth: Payer: Self-pay | Admitting: *Deleted

## 2015-01-04 NOTE — Telephone Encounter (Signed)
Patient called stating that the gabapentin is making him jittery.  Instructed him to stop if he needed to but he will try it one more day and let me know how he is tomorrow.

## 2015-01-11 ENCOUNTER — Encounter: Payer: Self-pay | Admitting: Neurology

## 2015-01-12 ENCOUNTER — Encounter: Payer: Self-pay | Admitting: Family Medicine

## 2015-01-12 ENCOUNTER — Other Ambulatory Visit: Payer: Self-pay | Admitting: *Deleted

## 2015-01-12 DIAGNOSIS — G909 Disorder of the autonomic nervous system, unspecified: Secondary | ICD-10-CM

## 2015-01-12 DIAGNOSIS — M792 Neuralgia and neuritis, unspecified: Secondary | ICD-10-CM

## 2015-01-12 DIAGNOSIS — M5106 Intervertebral disc disorders with myelopathy, lumbar region: Secondary | ICD-10-CM

## 2015-01-13 NOTE — Telephone Encounter (Signed)
Tell him to stop the Citalopram now. Instead of treating anxiety, I will try to slow his autonomic nervous system down with Propranolol. Call in Propranolol 10 mg TID, #90 with no rf. See how this works for him.

## 2015-01-16 ENCOUNTER — Encounter: Payer: Self-pay | Admitting: Family Medicine

## 2015-01-17 ENCOUNTER — Telehealth: Payer: Self-pay | Admitting: Family Medicine

## 2015-01-17 MED ORDER — PROPRANOLOL HCL 10 MG PO TABS: 10.0000 mg | ORAL_TABLET | Freq: Three times a day (TID) | ORAL | Status: DC

## 2015-01-17 NOTE — Telephone Encounter (Signed)
I sent in a new script for pt, updated medication list, left a voice message for pt to pick up script and also sent pt a my chart message with this information.

## 2015-01-26 ENCOUNTER — Encounter: Payer: Self-pay | Admitting: Family Medicine

## 2015-01-31 NOTE — Telephone Encounter (Signed)
I am glad he is somewhat better, but yes he probably feels tired as a side effect of the Propranolol. Tell him to decrease this to BID, and have him see [place] OV this week

## 2015-02-01 ENCOUNTER — Encounter: Payer: Self-pay | Admitting: Family Medicine

## 2015-02-01 DIAGNOSIS — M5432 Sciatica, left side: Secondary | ICD-10-CM

## 2015-02-01 NOTE — Telephone Encounter (Signed)
Yes he can monitor this and give up the results

## 2015-02-01 NOTE — Telephone Encounter (Signed)
Referral was done  

## 2015-02-03 ENCOUNTER — Ambulatory Visit: Payer: BLUE CROSS/BLUE SHIELD | Admitting: Neurology

## 2015-02-04 ENCOUNTER — Ambulatory Visit (INDEPENDENT_AMBULATORY_CARE_PROVIDER_SITE_OTHER): Payer: BLUE CROSS/BLUE SHIELD | Admitting: Family Medicine

## 2015-02-04 ENCOUNTER — Encounter: Payer: Self-pay | Admitting: Family Medicine

## 2015-02-04 VITALS — BP 132/80 | HR 79 | Temp 98.3°F

## 2015-02-04 DIAGNOSIS — I10 Essential (primary) hypertension: Secondary | ICD-10-CM | POA: Diagnosis not present

## 2015-02-04 DIAGNOSIS — M5440 Lumbago with sciatica, unspecified side: Secondary | ICD-10-CM | POA: Diagnosis not present

## 2015-02-04 MED ORDER — DICLOFENAC SODIUM 75 MG PO TBEC: 75.0000 mg | DELAYED_RELEASE_TABLET | Freq: Two times a day (BID) | ORAL | Status: DC

## 2015-02-04 MED ORDER — METOPROLOL SUCCINATE ER 50 MG PO TB24: 50.0000 mg | ORAL_TABLET | Freq: Every day | ORAL | Status: DC

## 2015-02-04 MED ORDER — BACLOFEN 10 MG PO TABS: 10.0000 mg | ORAL_TABLET | Freq: Three times a day (TID) | ORAL | Status: DC

## 2015-02-04 NOTE — Progress Notes (Signed)
Pre visit review using our clinic review tool, if applicable. No additional management support is needed unless otherwise documented below in the visit note. Pt unable to weigh 

## 2015-02-04 NOTE — Progress Notes (Signed)
   Subjective:    Patient ID: [name], male    DOB: [birth year], 54 y.o.   MRN: [id number]  HPI Here to follow up on several tings. He has been taking propranolol for his heart rate and BP, and his BP has been stable at home in the range of 120s to 130s over 80s. However the Propranolol is very sedating and he asks if he could try something else. He had been given Baclofen for spasms in his legs and asks if we could refill this. He still has low back pain wit numbness and weakness in the left leg, and he is now using a wheelchair since he cannot walk. He is waiting to hear from the Neurosurgery office about an appt to get a second opinion about his spinal situation.    Review of Systems  Respiratory: Negative.   Cardiovascular: Negative.   Musculoskeletal: Positive for back pain and gait problem.  Neurological: Positive for weakness and numbness.       Objective:   Physical Exam  Constitutional:  In a wheelchair   Neck: No thyromegaly present.  Cardiovascular: Normal rate, regular rhythm, normal heart sounds and intact distal pulses.   Pulmonary/Chest: Effort normal and breath sounds normal.          Assessment & Plan:  His HTN is stable but we will switch from Propranolol to Metorpolol succinate 50 mg daily. He will monitor the BP and HR at home. Refilled Baclofen and Diclofenac. Hopefully he can see Neurosurgery soon

## 2015-02-09 ENCOUNTER — Encounter: Payer: Self-pay | Admitting: Family Medicine

## 2015-02-09 NOTE — Telephone Encounter (Signed)
Can you check on this referral for pt?

## 2015-02-14 ENCOUNTER — Encounter: Payer: Self-pay | Admitting: Family Medicine

## 2015-02-15 NOTE — Telephone Encounter (Signed)
Can you check into this for pt?

## 2015-02-16 ENCOUNTER — Encounter: Payer: Self-pay | Admitting: Family Medicine

## 2015-02-18 ENCOUNTER — Encounter: Payer: Self-pay | Admitting: Family Medicine

## 2015-02-21 NOTE — Telephone Encounter (Signed)
I spoke with our referral coordinator Hilda Blades and she is looking into this for pt.

## 2015-02-22 ENCOUNTER — Encounter: Payer: Self-pay | Admitting: Family Medicine

## 2015-02-23 ENCOUNTER — Encounter: Payer: Self-pay | Admitting: Family Medicine

## 2015-02-24 ENCOUNTER — Encounter: Payer: Self-pay | Admitting: Neurology

## 2015-02-24 NOTE — Telephone Encounter (Signed)
I cannot order MRI scans of the spine because he has metal present that would make the film useless and unreadable. I suggest he make an OV with the [facility] neurologist he saw (Dr. Posey Pronto) ASAP for advice.

## 2015-03-01 ENCOUNTER — Other Ambulatory Visit: Payer: Self-pay | Admitting: *Deleted

## 2015-03-01 DIAGNOSIS — G909 Disorder of the autonomic nervous system, unspecified: Secondary | ICD-10-CM

## 2015-03-01 DIAGNOSIS — M792 Neuralgia and neuritis, unspecified: Secondary | ICD-10-CM

## 2015-03-01 DIAGNOSIS — M5106 Intervertebral disc disorders with myelopathy, lumbar region: Secondary | ICD-10-CM

## 2015-03-06 ENCOUNTER — Encounter: Payer: Self-pay | Admitting: Neurology

## 2015-03-12 ENCOUNTER — Inpatient Hospital Stay: Admission: RE | Admit: 2015-03-12 | Payer: BLUE CROSS/BLUE SHIELD | Source: Ambulatory Visit

## 2015-03-12 ENCOUNTER — Other Ambulatory Visit: Payer: BLUE CROSS/BLUE SHIELD

## 2015-03-15 ENCOUNTER — Telehealth: Payer: Self-pay

## 2015-03-15 NOTE — Telephone Encounter (Signed)
Started working on [employer] for MRIs. Contacted [employer] (anthem) to initiate. Was told [facility] insurance is not active. Attempted to contact pt for new insurance card. No answer. MRI scheduled had been cancelled by GI previously due to weather, not rescheduled.   CPT: [id number], [id number] ICD-10: G90.9, M79.2, M51.06

## 2015-04-05 ENCOUNTER — Encounter: Payer: Self-pay | Admitting: Family Medicine

## 2015-04-06 NOTE — Telephone Encounter (Signed)
Since he is doing so well right now, my advice is to stay on the medication as it is. The BP will probably go back up if we cut back any

## 2015-04-18 ENCOUNTER — Other Ambulatory Visit: Payer: Self-pay | Admitting: Family Medicine

## 2015-04-19 MED ORDER — HYDROCODONE-ACETAMINOPHEN 10-325 MG PO TABS: 1.0000 | ORAL_TABLET | Freq: Four times a day (QID) | ORAL | Status: DC | PRN

## 2015-04-19 NOTE — Addendum Note (Signed)
Addended by: [name] A on: 04/19/2015 05:30 PM   Modules accepted: Orders

## 2015-04-19 NOTE — Telephone Encounter (Signed)
done

## 2015-04-25 ENCOUNTER — Ambulatory Visit: Payer: BLUE CROSS/BLUE SHIELD | Admitting: Neurology

## 2015-06-01 ENCOUNTER — Encounter: Payer: Self-pay | Admitting: Family Medicine

## 2015-06-27 ENCOUNTER — Other Ambulatory Visit: Payer: Self-pay | Admitting: *Deleted

## 2015-06-27 MED ORDER — DICLOFENAC SODIUM 75 MG PO TBEC: 75.0000 mg | DELAYED_RELEASE_TABLET | Freq: Two times a day (BID) | ORAL | Status: DC

## 2015-06-27 MED ORDER — METOPROLOL SUCCINATE ER 50 MG PO TB24: 50.0000 mg | ORAL_TABLET | Freq: Every day | ORAL | Status: DC

## 2015-07-14 ENCOUNTER — Other Ambulatory Visit: Payer: Self-pay | Admitting: Neurological Surgery

## 2015-07-14 DIAGNOSIS — G8929 Other chronic pain: Secondary | ICD-10-CM

## 2015-07-14 DIAGNOSIS — M545 Low back pain, unspecified: Secondary | ICD-10-CM

## 2015-07-21 ENCOUNTER — Ambulatory Visit
Admission: RE | Admit: 2015-07-21 | Discharge: 2015-07-21 | Disposition: A | Discharge status: Home / Self Care | Payer: Managed Care, Other (non HMO) | Source: Ambulatory Visit | Attending: Neurological Surgery | Admitting: Neurological Surgery

## 2015-07-21 VITALS — BP 121/62 | HR 71

## 2015-07-21 DIAGNOSIS — G8929 Other chronic pain: Secondary | ICD-10-CM

## 2015-07-21 DIAGNOSIS — M545 Low back pain, unspecified: Secondary | ICD-10-CM

## 2015-07-21 DIAGNOSIS — Z9889 Other specified postprocedural states: Secondary | ICD-10-CM

## 2015-07-21 DIAGNOSIS — M48061 Spinal stenosis, lumbar region without neurogenic claudication: Secondary | ICD-10-CM

## 2015-07-21 DIAGNOSIS — M5137 Other intervertebral disc degeneration, lumbosacral region: Secondary | ICD-10-CM

## 2015-07-21 DIAGNOSIS — M5410 Radiculopathy, site unspecified: Secondary | ICD-10-CM

## 2015-07-21 DIAGNOSIS — M21372 Foot drop, left foot: Secondary | ICD-10-CM

## 2015-07-21 MED ORDER — ONDANSETRON HCL 4 MG/2ML IJ SOLN
4.0000 mg | Freq: Once | INTRAMUSCULAR | Status: AC
  Administered 2015-07-21: 4 mg via INTRAMUSCULAR

## 2015-07-21 MED ORDER — DIAZEPAM 5 MG PO TABS
5.0000 mg | ORAL_TABLET | Freq: Once | ORAL | Status: AC
  Administered 2015-07-21: 5 mg via ORAL

## 2015-07-21 MED ORDER — IOPAMIDOL (ISOVUE-M 300) INJECTION 61%
10.0000 mL | Freq: Once | INTRAMUSCULAR | Status: AC | PRN
  Administered 2015-07-21: 10 mL via INTRATHECAL

## 2015-07-21 MED ORDER — MEPERIDINE HCL 100 MG/ML IJ SOLN
100.0000 mg | Freq: Once | INTRAMUSCULAR | Status: AC
  Administered 2015-07-21: 100 mg via INTRAMUSCULAR

## 2015-07-21 NOTE — Discharge Instructions (Signed)

## 2015-11-29 ENCOUNTER — Ambulatory Visit (INDEPENDENT_AMBULATORY_CARE_PROVIDER_SITE_OTHER): Payer: Managed Care, Other (non HMO) | Admitting: Family Medicine

## 2015-11-29 ENCOUNTER — Encounter: Payer: Self-pay | Admitting: Family Medicine

## 2015-11-29 VITALS — BP 128/80 | Temp 98.6°F

## 2015-11-29 DIAGNOSIS — M48061 Spinal stenosis, lumbar region without neurogenic claudication: Secondary | ICD-10-CM

## 2015-11-29 DIAGNOSIS — M4806 Spinal stenosis, lumbar region: Secondary | ICD-10-CM | POA: Diagnosis not present

## 2015-11-29 DIAGNOSIS — B356 Tinea cruris: Secondary | ICD-10-CM

## 2015-11-29 DIAGNOSIS — F329 Major depressive disorder, single episode, unspecified: Secondary | ICD-10-CM | POA: Diagnosis not present

## 2015-11-29 DIAGNOSIS — F32A Depression, unspecified: Secondary | ICD-10-CM

## 2015-11-29 DIAGNOSIS — M5440 Lumbago with sciatica, unspecified side: Secondary | ICD-10-CM

## 2015-11-29 DIAGNOSIS — I10 Essential (primary) hypertension: Secondary | ICD-10-CM

## 2015-11-29 DIAGNOSIS — G8929 Other chronic pain: Secondary | ICD-10-CM

## 2015-11-29 MED ORDER — HYDROCODONE-ACETAMINOPHEN 10-325 MG PO TABS: 1.0000 | ORAL_TABLET | Freq: Four times a day (QID) | ORAL | 0 refills | Status: DC | PRN

## 2015-11-29 MED ORDER — AMOXICILLIN 875 MG PO TABS: 875.0000 mg | ORAL_TABLET | Freq: Two times a day (BID) | ORAL | 2 refills | Status: DC

## 2015-11-29 MED ORDER — KETOCONAZOLE 2 % EX CREA: 1.0000 "application " | TOPICAL_CREAM | Freq: Two times a day (BID) | CUTANEOUS | 2 refills | Status: DC | PRN

## 2015-11-29 MED ORDER — CITALOPRAM HYDROBROMIDE 20 MG PO TABS: 20.0000 mg | ORAL_TABLET | Freq: Every day | ORAL | 5 refills | Status: DC

## 2015-11-29 NOTE — Progress Notes (Signed)
   Subjective:    Patient ID: [name], male    DOB: 1960/04/11, 55 y.o.   MRN: [id number]  HPI Here to discuss a range of problems. He continues to suffer from chronic back pain despite multiple surgeries and his chronic leg weakness is steadily progressing. He is wheelchair bound other than transfers from a chair to a bed, etc. He is still continent of bowels and bladder. He had a third procedure this summer, a microdiscectomy, at L4-5 per Dr. Harl Bowie at [facility] but this did not help at all. Dr. Harl Bowie had been supplying his pain medication but he said that this would now need to come from [place] of from a pain clinic. [name] has had to increase his dosing of Norco from BID to QUID to get by. He cannot sleep well due to the pain. This has also caused him to become very depressed. His emotions alternate between sadness and anger over his situation, and he cries a lot. He has occasional thoughts about suicide but he denies any intent or plan so far. He had taken Celexa with good results about 10 years ago and he would like to try this again. He cannot drive so he relies on his sister to drive him to appointments. His BP has been stable at home. He also asks me to check an itchy rash in both groin areas that started a month ago.    Review of Systems  Respiratory: Negative.   Cardiovascular: Negative.   Gastrointestinal: Negative.   Genitourinary: Negative.   Musculoskeletal: Positive for back pain.  Neurological: Positive for weakness.  Psychiatric/Behavioral: Positive for dysphoric mood, sleep disturbance and suicidal ideas. Negative for agitation, confusion, decreased concentration, hallucinations and self-injury. The patient is nervous/anxious.        Objective:   Physical Exam  Constitutional: He is oriented to person, place, and time. He appears well-developed and well-nourished.  In his wheelchair   Cardiovascular: Normal rate, regular rhythm, normal heart sounds and intact distal  pulses.   Pulmonary/Chest: Effort normal and breath sounds normal.  Musculoskeletal: He exhibits no edema.  Neurological: He is alert and oriented to person, place, and time.  Skin:  Macular red rash in both groins   Psychiatric: His behavior is normal. Judgment and thought content normal.  Depressed affect but good eye contact, he gets tearful at times           Assessment & Plan:  He has chronic back pain which appears to be beyond remedy at this point. We will refill the Norco for one month. We wil refer him to a pain management clinic locally. He is depressed so he will start on Celexa 20 mg daily. Recheck with me in 2 weeks. I also recommended he meet with a therapist but he declined stating that transportation to appointments is difficult. Treat the tinea cruris with Ketoconazole cream.  Laurey Morale, MD

## 2015-11-29 NOTE — Progress Notes (Signed)
Pre visit review using our clinic review tool, if applicable. No additional management support is needed unless otherwise documented below in the visit note. Pt unable to stand and weigh.   

## 2015-12-20 ENCOUNTER — Encounter: Payer: Self-pay | Admitting: Family Medicine

## 2015-12-20 NOTE — Telephone Encounter (Signed)
Try Wellbutrin XL 150 mg daily. Call in #30 with 2 rf. Recheck in 3-4 weeks

## 2015-12-21 ENCOUNTER — Other Ambulatory Visit: Payer: Self-pay | Admitting: Family Medicine

## 2015-12-21 NOTE — Telephone Encounter (Signed)
This is a duplicate, see previous.

## 2015-12-23 ENCOUNTER — Other Ambulatory Visit: Payer: Self-pay | Admitting: Family Medicine

## 2015-12-23 MED ORDER — BUPROPION HCL ER (XL) 150 MG PO TB24: 150.0000 mg | ORAL_TABLET | Freq: Every day | ORAL | 2 refills | Status: DC

## 2015-12-23 NOTE — Telephone Encounter (Signed)
I left a voice message with below information, updated medidcation list, sent new script e-scribe to [facility] also did a my chart message.

## 2016-01-15 ENCOUNTER — Other Ambulatory Visit: Payer: Self-pay | Admitting: Family Medicine

## 2016-01-15 ENCOUNTER — Encounter: Payer: Self-pay | Admitting: Family Medicine

## 2016-01-16 NOTE — Telephone Encounter (Signed)
This is a duplicate note, see previous request.

## 2016-01-18 MED ORDER — HYDROCODONE-ACETAMINOPHEN 10-325 MG PO TABS: 1.0000 | ORAL_TABLET | Freq: Four times a day (QID) | ORAL | 0 refills | Status: DC | PRN

## 2016-01-18 NOTE — Telephone Encounter (Signed)
Script is ready for pick up here at front office and I left a voice message with this information.  

## 2016-01-18 NOTE — Telephone Encounter (Signed)
Done

## 2016-02-05 ENCOUNTER — Other Ambulatory Visit: Payer: Self-pay | Admitting: Family Medicine

## 2016-03-04 ENCOUNTER — Other Ambulatory Visit: Payer: Self-pay | Admitting: Family Medicine

## 2016-03-06 MED ORDER — HYDROCODONE-ACETAMINOPHEN 10-325 MG PO TABS: 1.0000 | ORAL_TABLET | Freq: Four times a day (QID) | ORAL | 0 refills | Status: DC | PRN

## 2016-03-06 NOTE — Telephone Encounter (Signed)
Rx is ready to pick up. We cannot mail these

## 2016-03-07 NOTE — Telephone Encounter (Signed)
I sent pt a my chart message with below information.  

## 2016-03-12 ENCOUNTER — Other Ambulatory Visit: Payer: Self-pay | Admitting: Family Medicine

## 2016-04-11 ENCOUNTER — Other Ambulatory Visit: Payer: Self-pay | Admitting: Family Medicine

## 2016-04-13 MED ORDER — HYDROCODONE-ACETAMINOPHEN 10-325 MG PO TABS: 1.0000 | ORAL_TABLET | Freq: Four times a day (QID) | ORAL | 0 refills | Status: DC | PRN

## 2016-04-13 NOTE — Telephone Encounter (Signed)
done

## 2016-04-13 NOTE — Telephone Encounter (Signed)
Script is ready for pick up here at front office and sent pt a my chart message.  

## 2016-05-08 ENCOUNTER — Encounter: Payer: Self-pay | Admitting: Family Medicine

## 2016-05-11 ENCOUNTER — Other Ambulatory Visit: Payer: Self-pay | Admitting: Family Medicine

## 2016-05-11 MED ORDER — METOPROLOL SUCCINATE ER 50 MG PO TB24: ORAL_TABLET | ORAL | 1 refills | Status: DC

## 2016-05-24 ENCOUNTER — Telehealth: Payer: Self-pay | Admitting: Family Medicine

## 2016-05-24 ENCOUNTER — Encounter: Payer: Self-pay | Admitting: Family Medicine

## 2016-05-24 ENCOUNTER — Ambulatory Visit (INDEPENDENT_AMBULATORY_CARE_PROVIDER_SITE_OTHER): Payer: BLUE CROSS/BLUE SHIELD | Admitting: Family Medicine

## 2016-05-24 VITALS — BP 122/62 | HR 86 | Temp 98.2°F | Ht 71.0 in | Wt 246.2 lb

## 2016-05-24 DIAGNOSIS — I10 Essential (primary) hypertension: Secondary | ICD-10-CM

## 2016-05-24 DIAGNOSIS — R000 Tachycardia, unspecified: Secondary | ICD-10-CM

## 2016-05-24 DIAGNOSIS — R29898 Other symptoms and signs involving the musculoskeletal system: Secondary | ICD-10-CM

## 2016-05-24 DIAGNOSIS — Z9889 Other specified postprocedural states: Secondary | ICD-10-CM | POA: Diagnosis not present

## 2016-05-24 DIAGNOSIS — C61 Malignant neoplasm of prostate: Secondary | ICD-10-CM

## 2016-05-24 LAB — CBC WITH DIFFERENTIAL/PLATELET
Basophils Absolute: 0.1 10*3/uL (ref 0.0–0.1)
Basophils Relative: 1 % (ref 0.0–3.0)
Eosinophils Absolute: 0.1 10*3/uL (ref 0.0–0.7)
Eosinophils Relative: 1.5 % (ref 0.0–5.0)
HCT: 47.7 % (ref 39.0–52.0)
Hemoglobin: 16.3 g/dL (ref 13.0–17.0)
Lymphocytes Relative: 16.7 % (ref 12.0–46.0)
Lymphs Abs: 0.9 10*3/uL (ref 0.7–4.0)
MCHC: 34.3 g/dL (ref 30.0–36.0)
MCV: 86.2 fl (ref 78.0–100.0)
Monocytes Absolute: 0.7 10*3/uL (ref 0.1–1.0)
Monocytes Relative: 11.7 % (ref 3.0–12.0)
Neutro Abs: 3.9 10*3/uL (ref 1.4–7.7)
Neutrophils Relative %: 69.1 % (ref 43.0–77.0)
Platelets: 243 10*3/uL (ref 150.0–400.0)
RBC: 5.53 Mil/uL (ref 4.22–5.81)
RDW: 14.6 % (ref 11.5–15.5)
WBC: 5.6 10*3/uL (ref 4.0–10.5)

## 2016-05-24 LAB — BASIC METABOLIC PANEL
BUN: 14 mg/dL (ref 6–23)
CO2: 28 mEq/L (ref 19–32)
Calcium: 9.5 mg/dL (ref 8.4–10.5)
Chloride: 105 mEq/L (ref 96–112)
Creatinine, Ser: 0.8 mg/dL (ref 0.40–1.50)
GFR: 107.02 mL/min (ref >60.00)
Glucose, Bld: 86 mg/dL (ref 70–99)
Potassium: 4 mEq/L (ref 3.5–5.1)
Sodium: 141 mEq/L (ref 135–145)

## 2016-05-24 LAB — LIPID PANEL
Cholesterol: 207 mg/dL — ABNORMAL HIGH (ref 0–200)
HDL: 38.9 mg/dL — ABNORMAL LOW (ref >39.00)
LDL Cholesterol: 146 mg/dL — ABNORMAL HIGH (ref 0–99)
NonHDL: 167.87
Total CHOL/HDL Ratio: 5
Triglycerides: 107 mg/dL (ref 0.0–149.0)
VLDL: 21.4 mg/dL (ref 0.0–40.0)

## 2016-05-24 LAB — HEPATIC FUNCTION PANEL
ALT: 39 U/L (ref 0–53)
AST: 28 U/L (ref 0–37)
Albumin: 4.4 g/dL (ref 3.5–5.2)
Alkaline Phosphatase: 86 U/L (ref 39–117)
Bilirubin, Direct: 0.1 mg/dL (ref 0.0–0.3)
Total Bilirubin: 0.5 mg/dL (ref 0.2–1.2)
Total Protein: 6.8 g/dL (ref 6.0–8.3)

## 2016-05-24 LAB — PSA: PSA: 0 ng/mL — ABNORMAL LOW (ref 0.10–4.00)

## 2016-05-24 LAB — TSH: TSH: 1.72 u[IU]/mL (ref 0.35–4.50)

## 2016-05-24 NOTE — Progress Notes (Signed)
   Subjective:    Patient ID: [name], male    DOB: 1960-05-02, 56 y.o.   MRN: [id number]  HPI Here for several issues. First about 3 weeks ago while he was in a PT session, as he stood up out of his wheelchair he suddenly blacked out and slumped back in the wheelchair. He was out only a few seconds and then was back to normal. He has had no further syncopal spells since then. However in the past 3 weeks he has often noticed his resting heart rate to be as high as 125. 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 Sometimes he feels his heart skipping or beating irregularly. Sometimes he feels mildly SOB but never has chest pain. His BP has remained well controlled through all of this. Also he asks if his neck can be imaged. He sees Dr. Jenne Campus for multilevel spinal disc diease and he has had a number of surgeries. He has bilateral leg weakness and has had many scans of the thoracic and lumbar spines, but his cervical spine has not been imaged since his last surgery.    Review of Systems  Respiratory: Positive for shortness of breath. Negative for cough, chest tightness and wheezing.   Cardiovascular: Positive for palpitations. Negative for chest pain and leg swelling.  Neurological: Positive for syncope and weakness.       Objective:   Physical Exam  Constitutional:  In his wheelchair  Neck: No thyromegaly present.  Cardiovascular: Normal rate, regular rhythm, normal heart sounds and intact distal pulses.   No murmur heard. EKG shows normal SR    Pulmonary/Chest: Effort normal and breath sounds normal. No respiratory distress. He has no wheezes. He has no rales.  Lymphadenopathy:    He has no cervical adenopathy.  Neurological: He is alert.          Assessment & Plan:  His HTN is well controlled but he is having spells of tachycardia and had once brief syncopal episode. Possible etiologies include SVT or  paroxysmal AF. We will get labs today and refer him to Cardiology for further evaluation. Set up an MRI of the cervical spine.  [name], MD

## 2016-05-24 NOTE — Telephone Encounter (Signed)
Please advise 

## 2016-05-24 NOTE — Telephone Encounter (Signed)
Pat at [facility] needs to have a new order put in for the pt the state MRI with and without contrast for cervical spine and she will take care of the other order that is in [facility]

## 2016-05-25 NOTE — Telephone Encounter (Signed)
Nothing further needed 

## 2016-05-25 NOTE — Addendum Note (Signed)
Addended by: [name] A on: 05/25/2016 09:28 AM   Modules accepted: Orders

## 2016-05-25 NOTE — Telephone Encounter (Signed)
I will change the order. 

## 2016-06-06 ENCOUNTER — Other Ambulatory Visit: Payer: BLUE CROSS/BLUE SHIELD

## 2016-06-08 ENCOUNTER — Other Ambulatory Visit: Payer: Self-pay | Admitting: Family Medicine

## 2016-06-11 MED ORDER — HYDROCODONE-ACETAMINOPHEN 10-325 MG PO TABS: 1.0000 | ORAL_TABLET | Freq: Four times a day (QID) | ORAL | 0 refills | Status: DC | PRN

## 2016-06-11 NOTE — Telephone Encounter (Signed)
done

## 2016-06-12 NOTE — Telephone Encounter (Signed)
Script is ready for pick up here at front office and I left a voice message for pt with this information.  

## 2016-06-13 NOTE — Progress Notes (Signed)
Cardiology Office Note   Date:  06/15/2016   ID:  [name], DOB 1960/04/23, MRN [id number]  PCP:  [name], MD  Cardiologist:   Dorris Carnes, MD   Pt referred by Barbie Banner for eval of tachycardia .    History of Present Illness: [name] is a 56 y.o. male with a history oftachycardia Pt seen by Barbie Banner  He was at PT a few wks ago  Had been pushing wts  Stood up  Got very lightheaded with near syncope  HR high 160s   Pt had niticed high HR at other times   Can get to 180s  BP OK   Pt had a couple back surgeries  In 2015 had surgery on C spine  In 2016 had surgery on lumbear spine    Pt had episode in past what felt like panic  HR increased  Placed on metoprolol at the tme    Yesterday dizzy walking up ramp    He denis cough  No fever  No recent infection    Says when he works out will wait about 30 sec before changing positons      Current Meds  Medication Sig  . amoxicillin (AMOXIL) 875 MG tablet Take 1 tablet (875 mg total) by mouth 2 (two) times daily. Prior to dental appointment  . aspirin 81 MG tablet Take 81 mg by mouth daily.  . diclofenac (VOLTAREN) 75 MG EC tablet Take 1 tablet (75 mg total) by mouth 2 (two) times daily.  Marland Kitchen HYDROcodone-acetaminophen (NORCO) 10-325 MG tablet Take 1 tablet by mouth every 6 (six) hours as needed for moderate pain.  . metoprolol succinate (TOPROL-XL) 50 MG 24 hr tablet TAKE 1 TABLET DAILY, TAKE WITH OR IMMEDIATELY FOLLOWING A MEAL     Allergies:   Flexeril [cyclobenzaprine]; Oxycodone-acetaminophen; and Oxycodone-acetaminophen   Past Medical History:  Diagnosis Date  . Arthritis   . Cancer [facility])    prostate, sees Dr. Risa Grill   . Colon polyps   . Degenerative disc disease, cervical   . Degenerative disc disease, lumbar   . Depression   . GERD (gastroesophageal reflux disease)   . Low back pain   . Osteopenia     Past Surgical History:  Procedure Laterality Date  . CERVICAL FUSION  2015   C3 through C7, per Dr.  Harl Bowie   . COLONOSCOPY  2010   per a GI in [place], clear   . CYSTECTOMY     benign, right hip  . GYNECOMASTIA EXCISION     bilateral, reduction  . JOINT REPLACEMENT  12-20-11   hx. LTHA, now RTHA planned  . LAMINECTOMY  2/09   lumbar  . LUMBAR FUSION  2015   L4 through S1, per Dr. Harl Bowie   . LUMBAR MICRODISCECTOMY  10/15/08   L-4-5, per Dr. Jenne Campus at [facility]  . PROSTATE SURGERY  12-13-10   robotic prostatectomy per Dr. Risa Grill   . TOTAL HIP ARTHROPLASTY  12-20-11   2010 left hip  . TOTAL HIP ARTHROPLASTY  12/26/2011   Procedure: TOTAL HIP ARTHROPLASTY;  Surgeon: Gearlean Alf, MD;  Location: [facility] ORS;  Service: Orthopedics;  Laterality: Right;     Social History:  The patient  reports that he has never smoked. He has never used smokeless tobacco. He reports that he does not drink alcohol or use drugs.   Family History:  The patient's family history includes Cancer in his brother; Healthy in his mother;  Hypertension in his father; Suicidality in his father.    ROS:  Please see the history of present illness. All other systems are reviewed and  Negative to the above problem except as noted.    PHYSICAL EXAM: VS:  BP 124/78   Pulse 78   Ht 5\' 11"  (1.803 m)   Wt 245 lb (111.1 kg)   SpO2 98%   BMI 34.17 kg/m   GEN: Obese 56 yo, in no acute distress  HEENT: normal  Neck: no JVD, carotid bruits, or masses Cardiac: RRR; no murmurs, rubs, or gallops,no edema  Respiratory:  clear to auscultation bilaterally, normal work of breathing GI: soft, nontender, nondistended, + BS  No hepatomegaly  MS: no deformity Moving all extremities   Skin: warm and dry, no rash Neuro:  Strength and sensation are intact Psych: euthymic mood, full affect   EKG:  EKG is not ordered today.  On 05/24/16 SR 62 bpm     Lipid Panel    Component Value Date/Time   CHOL 207 (H) 05/24/2016 1023   TRIG 107.0 05/24/2016 1023   HDL 38.90 (L) 05/24/2016 1023   CHOLHDL 5  05/24/2016 1023   VLDL 21.4 05/24/2016 1023   LDLCALC 146 (H) 05/24/2016 1023   LDLDIRECT 138.8 08/17/2009 0755      Wt Readings from Last 3 Encounters:  06/15/16 245 lb (111.1 kg)  05/24/16 246 lb 3.2 oz (111.7 kg)  12/31/14 215 lb (97.5 kg)      ASSESSMENT AND PLAN:  1  Palpitatons/tachycardia  The episodes sound like orthostatic hypotension  But the pt is not orhtostatic on exam today  He does admit to not drinking a lot of fluids   I am not convinced o fan arrhytia  ? If related to C spine problems/history  2  Dizziness  As noted above    I would recomm an event monitor to r/o arrhythmia I have also increasged the pt to increase fluid/salt intake  Take time with standing   I will set to see the pt in 6 wks  s   Current medicines are reviewed at length with the patient today.  The patient does not have concerns regarding medicines.  Signed, Dorris Carnes, MD  06/15/2016 1:35 PM    [facility] [address], [place], [place]  [address] Phone: [phone]; Fax: [phone]

## 2016-06-15 ENCOUNTER — Encounter: Payer: Self-pay | Admitting: Internal Medicine

## 2016-06-15 ENCOUNTER — Ambulatory Visit (INDEPENDENT_AMBULATORY_CARE_PROVIDER_SITE_OTHER): Payer: BLUE CROSS/BLUE SHIELD | Admitting: Internal Medicine

## 2016-06-15 VITALS — BP 124/78 | HR 78 | Ht 71.0 in | Wt 245.0 lb

## 2016-06-15 DIAGNOSIS — R42 Dizziness and giddiness: Secondary | ICD-10-CM | POA: Diagnosis not present

## 2016-06-15 DIAGNOSIS — I10 Essential (primary) hypertension: Secondary | ICD-10-CM | POA: Diagnosis not present

## 2016-06-15 DIAGNOSIS — R002 Palpitations: Secondary | ICD-10-CM

## 2016-06-15 NOTE — Patient Instructions (Addendum)
Your physician recommends that you continue on your current medications as directed. Please refer to the Current Medication list given to you today.  Your physician has recommended that you wear an event monitor. Event monitors are medical devices that record the heart's electrical activity. Doctors most often [place] these monitors to diagnose arrhythmias. Arrhythmias are problems with the speed or rhythm of the heartbeat. The monitor is a small, portable device. You can wear one while you do your normal daily activities. This is usually used to diagnose what is causing palpitations/syncope (passing out).  Increase your fluid intake and salt intake.  Your physician recommends that you schedule a follow-up appointment in: 6 weeks with Dr. Harrington Challenger.

## 2016-06-22 ENCOUNTER — Ambulatory Visit (INDEPENDENT_AMBULATORY_CARE_PROVIDER_SITE_OTHER): Payer: BLUE CROSS/BLUE SHIELD

## 2016-06-22 DIAGNOSIS — I10 Essential (primary) hypertension: Secondary | ICD-10-CM | POA: Diagnosis not present

## 2016-06-22 DIAGNOSIS — R002 Palpitations: Secondary | ICD-10-CM

## 2016-08-03 ENCOUNTER — Ambulatory Visit: Payer: BLUE CROSS/BLUE SHIELD | Admitting: Internal Medicine

## 2016-08-06 ENCOUNTER — Other Ambulatory Visit: Payer: Self-pay | Admitting: Family Medicine

## 2016-08-07 MED ORDER — HYDROCODONE-ACETAMINOPHEN 10-325 MG PO TABS: 1.0000 | ORAL_TABLET | Freq: Four times a day (QID) | ORAL | 0 refills | Status: DC | PRN

## 2016-08-07 NOTE — Telephone Encounter (Signed)
done

## 2016-09-02 ENCOUNTER — Other Ambulatory Visit: Payer: Self-pay | Admitting: Family Medicine

## 2016-09-19 ENCOUNTER — Other Ambulatory Visit: Payer: Self-pay | Admitting: Family Medicine

## 2016-09-19 MED ORDER — HYDROCODONE-ACETAMINOPHEN 10-325 MG PO TABS: 1.0000 | ORAL_TABLET | Freq: Four times a day (QID) | ORAL | 0 refills | Status: DC | PRN

## 2016-09-19 NOTE — Telephone Encounter (Signed)
done

## 2016-10-25 ENCOUNTER — Other Ambulatory Visit: Payer: Self-pay | Admitting: Family Medicine

## 2016-10-27 ENCOUNTER — Other Ambulatory Visit: Payer: Self-pay | Admitting: Family Medicine

## 2016-10-29 MED ORDER — HYDROCODONE-ACETAMINOPHEN 10-325 MG PO TABS: 1.0000 | ORAL_TABLET | Freq: Four times a day (QID) | ORAL | 0 refills | Status: DC | PRN

## 2016-10-29 NOTE — Telephone Encounter (Signed)
I sent pt a mychart message.

## 2016-10-29 NOTE — Telephone Encounter (Signed)
done

## 2016-11-02 NOTE — Telephone Encounter (Signed)
Pt friend Dion Body II provided ID & signed log and picked up script NARCO 10-325. cb

## 2016-11-08 ENCOUNTER — Other Ambulatory Visit: Payer: Self-pay | Admitting: Family Medicine

## 2016-11-22 ENCOUNTER — Encounter: Payer: Self-pay | Admitting: Family Medicine

## 2016-11-28 ENCOUNTER — Ambulatory Visit (INDEPENDENT_AMBULATORY_CARE_PROVIDER_SITE_OTHER): Payer: Medicare Other | Admitting: Family Medicine

## 2016-11-28 ENCOUNTER — Encounter: Payer: Self-pay | Admitting: Family Medicine

## 2016-11-28 VITALS — BP 150/90 | Temp 98.3°F | Wt 238.4 lb

## 2016-11-28 DIAGNOSIS — E291 Testicular hypofunction: Secondary | ICD-10-CM

## 2016-11-28 DIAGNOSIS — I10 Essential (primary) hypertension: Secondary | ICD-10-CM | POA: Diagnosis not present

## 2016-11-28 DIAGNOSIS — E785 Hyperlipidemia, unspecified: Secondary | ICD-10-CM

## 2016-11-28 DIAGNOSIS — Z23 Encounter for immunization: Secondary | ICD-10-CM

## 2016-11-28 DIAGNOSIS — Z9889 Other specified postprocedural states: Secondary | ICD-10-CM | POA: Diagnosis not present

## 2016-11-28 DIAGNOSIS — Z0000 Encounter for general adult medical examination without abnormal findings: Secondary | ICD-10-CM | POA: Diagnosis not present

## 2016-11-28 DIAGNOSIS — C61 Malignant neoplasm of prostate: Secondary | ICD-10-CM | POA: Diagnosis not present

## 2016-11-28 LAB — LIPID PANEL
Cholesterol: 220 mg/dL — ABNORMAL HIGH (ref 0–200)
HDL: 29.8 mg/dL — ABNORMAL LOW (ref >39.00)
LDL Cholesterol: 167 mg/dL — ABNORMAL HIGH (ref 0–99)
NonHDL: 189.84
Total CHOL/HDL Ratio: 7
Triglycerides: 116 mg/dL (ref 0.0–149.0)
VLDL: 23.2 mg/dL (ref 0.0–40.0)

## 2016-11-28 LAB — PSA: PSA: 0.01 ng/mL — ABNORMAL LOW (ref 0.10–4.00)

## 2016-11-28 LAB — TESTOSTERONE: Testosterone: 475.09 ng/dL (ref 300.00–890.00)

## 2016-11-28 MED ORDER — HYDROCODONE-ACETAMINOPHEN 10-325 MG PO TABS: 1.0000 | ORAL_TABLET | Freq: Four times a day (QID) | ORAL | 0 refills | Status: DC | PRN

## 2016-11-28 NOTE — Progress Notes (Signed)
   Subjective:    Patient ID: [name], male    DOB: 06/06/1960, 56 y.o.   MRN: [id number]  HPI Here for a number of issues. First he is proud to say he has worked his way out of the wheelchair and he is now walking short distances with a cane. He is working with a PT named [facility] to help strengthen his legs. He needs a referral to continue to see him, since his insurance recently changed to Medicare. He is dieting and he has lost 22 lbs over the past 12 weeks. He needs labs today to recheck a PSA and lipids. He last saw Dr. Risa Grill in 2015, who did his prostate surgery. Dr. Risa Grill had also been prescribing him TriMix injections for erectile dysfucntion, since Cialis and Viagra did not help. His BP is stable.    Review of Systems  Constitutional: Negative.   Respiratory: Negative.   Cardiovascular: Negative.   Genitourinary: Negative.   Neurological: Negative.        Objective:   Physical Exam  Constitutional: He is oriented to person, place, and time. He appears well-developed and well-nourished.  Walks with a cane   Cardiovascular: Normal rate, regular rhythm, normal heart sounds and intact distal pulses.   Pulmonary/Chest: Effort normal and breath sounds normal. No respiratory distress. He has no wheezes. He has no rales.  Neurological: He is alert and oriented to person, place, and time.          Assessment & Plan:  He has successfully gotten out of the wheelchair (after spinal surgery) and he is walking again. We will refer him back to PT to continue therapy. We will refer him back to Dr. Risa Grill for ED. Set up another colonoscopy. Get fasting labs today for PSA and testosterone, as well as lipids.  [name], MD

## 2016-11-28 NOTE — Addendum Note (Signed)
Addended by: [name] A on: 11/28/2016 10:08 AM   Modules accepted: Orders

## 2016-11-28 NOTE — Addendum Note (Signed)
Addended by: Aggie Hacker A on: 11/28/2016 10:33 AM   Modules accepted: Orders

## 2016-12-31 ENCOUNTER — Encounter: Payer: Self-pay | Admitting: Family Medicine

## 2016-12-31 ENCOUNTER — Other Ambulatory Visit: Payer: Self-pay | Admitting: Family Medicine

## 2017-01-01 MED ORDER — HYDROCODONE-ACETAMINOPHEN 10-325 MG PO TABS: 1.0000 | ORAL_TABLET | Freq: Four times a day (QID) | ORAL | 0 refills | Status: DC | PRN

## 2017-01-01 NOTE — Telephone Encounter (Signed)
Done

## 2017-01-01 NOTE — Telephone Encounter (Signed)
Call in Amoxicillin 500 mg to take 4 tabs prior to dental work, #4 with 5 rf

## 2017-01-02 ENCOUNTER — Other Ambulatory Visit: Payer: Self-pay

## 2017-01-02 MED ORDER — AMOXICILLIN 500 MG PO TABS: ORAL_TABLET | ORAL | 5 refills | Status: DC

## 2017-01-02 NOTE — Telephone Encounter (Signed)
Sent a my chart message.

## 2017-01-03 DIAGNOSIS — R278 Other lack of coordination: Secondary | ICD-10-CM | POA: Diagnosis not present

## 2017-01-03 DIAGNOSIS — M21372 Foot drop, left foot: Secondary | ICD-10-CM | POA: Diagnosis not present

## 2017-01-03 DIAGNOSIS — R2689 Other abnormalities of gait and mobility: Secondary | ICD-10-CM | POA: Diagnosis not present

## 2017-01-07 DIAGNOSIS — R278 Other lack of coordination: Secondary | ICD-10-CM | POA: Diagnosis not present

## 2017-01-07 DIAGNOSIS — M21372 Foot drop, left foot: Secondary | ICD-10-CM | POA: Diagnosis not present

## 2017-01-07 DIAGNOSIS — R2689 Other abnormalities of gait and mobility: Secondary | ICD-10-CM | POA: Diagnosis not present

## 2017-01-10 DIAGNOSIS — R278 Other lack of coordination: Secondary | ICD-10-CM | POA: Diagnosis not present

## 2017-01-10 DIAGNOSIS — M21372 Foot drop, left foot: Secondary | ICD-10-CM | POA: Diagnosis not present

## 2017-01-10 DIAGNOSIS — R2689 Other abnormalities of gait and mobility: Secondary | ICD-10-CM | POA: Diagnosis not present

## 2017-01-14 DIAGNOSIS — R278 Other lack of coordination: Secondary | ICD-10-CM | POA: Diagnosis not present

## 2017-01-14 DIAGNOSIS — M21372 Foot drop, left foot: Secondary | ICD-10-CM | POA: Diagnosis not present

## 2017-01-14 DIAGNOSIS — R2689 Other abnormalities of gait and mobility: Secondary | ICD-10-CM | POA: Diagnosis not present

## 2017-01-17 DIAGNOSIS — R278 Other lack of coordination: Secondary | ICD-10-CM | POA: Diagnosis not present

## 2017-01-17 DIAGNOSIS — M21372 Foot drop, left foot: Secondary | ICD-10-CM | POA: Diagnosis not present

## 2017-01-17 DIAGNOSIS — R2689 Other abnormalities of gait and mobility: Secondary | ICD-10-CM | POA: Diagnosis not present

## 2017-01-23 DIAGNOSIS — M21372 Foot drop, left foot: Secondary | ICD-10-CM | POA: Diagnosis not present

## 2017-01-23 DIAGNOSIS — R278 Other lack of coordination: Secondary | ICD-10-CM | POA: Diagnosis not present

## 2017-01-23 DIAGNOSIS — R2689 Other abnormalities of gait and mobility: Secondary | ICD-10-CM | POA: Diagnosis not present

## 2017-01-28 ENCOUNTER — Ambulatory Visit: Payer: BLUE CROSS/BLUE SHIELD | Admitting: Family Medicine

## 2017-01-28 DIAGNOSIS — M21372 Foot drop, left foot: Secondary | ICD-10-CM | POA: Diagnosis not present

## 2017-01-28 DIAGNOSIS — R2689 Other abnormalities of gait and mobility: Secondary | ICD-10-CM | POA: Diagnosis not present

## 2017-01-28 DIAGNOSIS — R278 Other lack of coordination: Secondary | ICD-10-CM | POA: Diagnosis not present

## 2017-01-31 DIAGNOSIS — R278 Other lack of coordination: Secondary | ICD-10-CM | POA: Diagnosis not present

## 2017-01-31 DIAGNOSIS — M21372 Foot drop, left foot: Secondary | ICD-10-CM | POA: Diagnosis not present

## 2017-01-31 DIAGNOSIS — R2689 Other abnormalities of gait and mobility: Secondary | ICD-10-CM | POA: Diagnosis not present

## 2017-02-04 DIAGNOSIS — R278 Other lack of coordination: Secondary | ICD-10-CM | POA: Diagnosis not present

## 2017-02-04 DIAGNOSIS — M21372 Foot drop, left foot: Secondary | ICD-10-CM | POA: Diagnosis not present

## 2017-02-04 DIAGNOSIS — R2689 Other abnormalities of gait and mobility: Secondary | ICD-10-CM | POA: Diagnosis not present

## 2017-02-05 ENCOUNTER — Ambulatory Visit: Payer: BLUE CROSS/BLUE SHIELD | Admitting: Family Medicine

## 2017-02-07 DIAGNOSIS — R278 Other lack of coordination: Secondary | ICD-10-CM | POA: Diagnosis not present

## 2017-02-07 DIAGNOSIS — R2689 Other abnormalities of gait and mobility: Secondary | ICD-10-CM | POA: Diagnosis not present

## 2017-02-07 DIAGNOSIS — M21372 Foot drop, left foot: Secondary | ICD-10-CM | POA: Diagnosis not present

## 2017-02-08 ENCOUNTER — Ambulatory Visit (INDEPENDENT_AMBULATORY_CARE_PROVIDER_SITE_OTHER): Payer: Medicare HMO | Admitting: Family Medicine

## 2017-02-08 ENCOUNTER — Encounter: Payer: Self-pay | Admitting: Family Medicine

## 2017-02-08 VITALS — BP 120/70 | HR 95 | Temp 98.2°F | Wt 234.8 lb

## 2017-02-08 DIAGNOSIS — R69 Illness, unspecified: Secondary | ICD-10-CM | POA: Diagnosis not present

## 2017-02-08 DIAGNOSIS — F1190 Opioid use, unspecified, uncomplicated: Secondary | ICD-10-CM | POA: Diagnosis not present

## 2017-02-08 DIAGNOSIS — M5440 Lumbago with sciatica, unspecified side: Secondary | ICD-10-CM

## 2017-02-08 DIAGNOSIS — G8929 Other chronic pain: Secondary | ICD-10-CM

## 2017-02-08 MED ORDER — TRAMADOL HCL 50 MG PO TABS: 100.0000 mg | ORAL_TABLET | Freq: Four times a day (QID) | ORAL | 2 refills | Status: DC | PRN

## 2017-02-08 NOTE — Progress Notes (Signed)
   Subjective:    Patient ID: LORIK GUO, male    DOB: [birth year], 56 y.o.   MRN: [id number]  HPI Here for pain management.  Indication for chronic opioid: chronic low back pain  Medication and dose: Norco 10/325  # pills per month: 120 Last UDS date: 02-08-17 Pain contract signed (Y/N): 02-08-17  Date narcotic database last reviewed (include red flags): 02-08-17 He is doing quite well. He is going to PT twice a week. He says he has very little pain now and wants to get off narcotics. He walks with a cane.   Review of Systems  Constitutional: Negative.   Respiratory: Negative.   Cardiovascular: Negative.   Musculoskeletal: Positive for back pain.       Objective:   Physical Exam  Constitutional: He is oriented to person, place, and time. He appears well-developed and well-nourished.  Cardiovascular: Normal rate, regular rhythm, normal heart sounds and intact distal pulses.  Pulmonary/Chest: Effort normal and breath sounds normal.  Neurological: He is alert and oriented to person, place, and time.          Assessment & Plan:  He has chronic low back pain that is improving with exercise and weight loss. He takes Diclofenac 75 mg bid. We will stop all Norco and try Tramadol 100 mg every 6 hours prn. Recheck one month.  [name], MD

## 2017-02-14 DIAGNOSIS — M21372 Foot drop, left foot: Secondary | ICD-10-CM | POA: Diagnosis not present

## 2017-02-14 DIAGNOSIS — R278 Other lack of coordination: Secondary | ICD-10-CM | POA: Diagnosis not present

## 2017-02-14 DIAGNOSIS — R2689 Other abnormalities of gait and mobility: Secondary | ICD-10-CM | POA: Diagnosis not present

## 2017-02-14 LAB — PAIN MGMT, PROFILE 8 W/CONF, U
6 Acetylmorphine: NEGATIVE ng/mL (ref <10)
Alcohol Metabolites: NEGATIVE ng/mL (ref <500)
Alphahydroxyalprazolam: NEGATIVE ng/mL (ref <25)
Alphahydroxymidazolam: NEGATIVE ng/mL (ref <50)
Alphahydroxytriazolam: NEGATIVE ng/mL (ref <50)
Aminoclonazepam: NEGATIVE ng/mL (ref <25)
Amphetamines: NEGATIVE ng/mL (ref <500)
Benzodiazepines: NEGATIVE ng/mL (ref <100)
Buprenorphine, Urine: NEGATIVE ng/mL (ref <5)
Cocaine Metabolite: NEGATIVE ng/mL (ref <150)
Codeine: NEGATIVE ng/mL (ref <50)
Creatinine: 221 mg/dL
Hydrocodone: 2436 ng/mL — ABNORMAL HIGH (ref <50)
Hydromorphone: 1635 ng/mL — ABNORMAL HIGH (ref <50)
Hydroxyethylflurazepam: NEGATIVE ng/mL (ref <50)
Lorazepam: NEGATIVE ng/mL (ref <50)
MDMA: NEGATIVE ng/mL (ref <500)
Marijuana Metabolite: NEGATIVE ng/mL (ref <20)
Morphine: NEGATIVE ng/mL (ref <50)
Nordiazepam: NEGATIVE ng/mL (ref <50)
Norhydrocodone: 6405 ng/mL — ABNORMAL HIGH (ref <50)
Opiates: POSITIVE ng/mL — AB (ref <100)
Oxazepam: NEGATIVE ng/mL (ref <50)
Oxidant: NEGATIVE ug/mL (ref <200)
Oxycodone: NEGATIVE ng/mL (ref <100)
Temazepam: NEGATIVE ng/mL (ref <50)
pH: 6.09 (ref 4.5–9.0)

## 2017-02-18 DIAGNOSIS — R2689 Other abnormalities of gait and mobility: Secondary | ICD-10-CM | POA: Diagnosis not present

## 2017-02-18 DIAGNOSIS — R278 Other lack of coordination: Secondary | ICD-10-CM | POA: Diagnosis not present

## 2017-02-18 DIAGNOSIS — M21372 Foot drop, left foot: Secondary | ICD-10-CM | POA: Diagnosis not present

## 2017-02-21 DIAGNOSIS — R278 Other lack of coordination: Secondary | ICD-10-CM | POA: Diagnosis not present

## 2017-02-21 DIAGNOSIS — M21372 Foot drop, left foot: Secondary | ICD-10-CM | POA: Diagnosis not present

## 2017-02-21 DIAGNOSIS — R2689 Other abnormalities of gait and mobility: Secondary | ICD-10-CM | POA: Diagnosis not present

## 2017-02-27 ENCOUNTER — Other Ambulatory Visit: Payer: Self-pay | Admitting: Family Medicine

## 2017-02-28 NOTE — Telephone Encounter (Signed)
Last OV 02/08/2017. Rx was last refilled 09/03/2016 disp 180 with 1 refill.

## 2017-03-01 ENCOUNTER — Encounter: Payer: Self-pay | Admitting: Family Medicine

## 2017-03-04 DIAGNOSIS — R278 Other lack of coordination: Secondary | ICD-10-CM | POA: Diagnosis not present

## 2017-03-04 DIAGNOSIS — R2689 Other abnormalities of gait and mobility: Secondary | ICD-10-CM | POA: Diagnosis not present

## 2017-03-04 DIAGNOSIS — M21372 Foot drop, left foot: Secondary | ICD-10-CM | POA: Diagnosis not present

## 2017-03-06 DIAGNOSIS — Z09 Encounter for follow-up examination after completed treatment for conditions other than malignant neoplasm: Secondary | ICD-10-CM | POA: Diagnosis not present

## 2017-03-06 DIAGNOSIS — Z9989 Dependence on other enabling machines and devices: Secondary | ICD-10-CM | POA: Diagnosis not present

## 2017-03-06 DIAGNOSIS — Z981 Arthrodesis status: Secondary | ICD-10-CM | POA: Diagnosis not present

## 2017-03-06 DIAGNOSIS — Z4789 Encounter for other orthopedic aftercare: Secondary | ICD-10-CM | POA: Diagnosis not present

## 2017-03-07 DIAGNOSIS — M21372 Foot drop, left foot: Secondary | ICD-10-CM | POA: Diagnosis not present

## 2017-03-07 DIAGNOSIS — R278 Other lack of coordination: Secondary | ICD-10-CM | POA: Diagnosis not present

## 2017-03-07 DIAGNOSIS — R2689 Other abnormalities of gait and mobility: Secondary | ICD-10-CM | POA: Diagnosis not present

## 2017-03-11 DIAGNOSIS — R278 Other lack of coordination: Secondary | ICD-10-CM | POA: Diagnosis not present

## 2017-03-11 DIAGNOSIS — R2689 Other abnormalities of gait and mobility: Secondary | ICD-10-CM | POA: Diagnosis not present

## 2017-03-11 DIAGNOSIS — M21372 Foot drop, left foot: Secondary | ICD-10-CM | POA: Diagnosis not present

## 2017-03-21 DIAGNOSIS — R278 Other lack of coordination: Secondary | ICD-10-CM | POA: Diagnosis not present

## 2017-03-21 DIAGNOSIS — M21372 Foot drop, left foot: Secondary | ICD-10-CM | POA: Diagnosis not present

## 2017-03-21 DIAGNOSIS — R2689 Other abnormalities of gait and mobility: Secondary | ICD-10-CM | POA: Diagnosis not present

## 2017-03-25 DIAGNOSIS — R278 Other lack of coordination: Secondary | ICD-10-CM | POA: Diagnosis not present

## 2017-03-25 DIAGNOSIS — R2689 Other abnormalities of gait and mobility: Secondary | ICD-10-CM | POA: Diagnosis not present

## 2017-03-25 DIAGNOSIS — M21372 Foot drop, left foot: Secondary | ICD-10-CM | POA: Diagnosis not present

## 2017-03-28 DIAGNOSIS — R2689 Other abnormalities of gait and mobility: Secondary | ICD-10-CM | POA: Diagnosis not present

## 2017-03-28 DIAGNOSIS — R278 Other lack of coordination: Secondary | ICD-10-CM | POA: Diagnosis not present

## 2017-03-28 DIAGNOSIS — M21372 Foot drop, left foot: Secondary | ICD-10-CM | POA: Diagnosis not present

## 2017-04-04 DIAGNOSIS — R278 Other lack of coordination: Secondary | ICD-10-CM | POA: Diagnosis not present

## 2017-04-04 DIAGNOSIS — M21372 Foot drop, left foot: Secondary | ICD-10-CM | POA: Diagnosis not present

## 2017-04-04 DIAGNOSIS — R2689 Other abnormalities of gait and mobility: Secondary | ICD-10-CM | POA: Diagnosis not present

## 2017-04-08 DIAGNOSIS — R2689 Other abnormalities of gait and mobility: Secondary | ICD-10-CM | POA: Diagnosis not present

## 2017-04-08 DIAGNOSIS — M21372 Foot drop, left foot: Secondary | ICD-10-CM | POA: Diagnosis not present

## 2017-04-08 DIAGNOSIS — R278 Other lack of coordination: Secondary | ICD-10-CM | POA: Diagnosis not present

## 2017-04-11 DIAGNOSIS — R278 Other lack of coordination: Secondary | ICD-10-CM | POA: Diagnosis not present

## 2017-04-11 DIAGNOSIS — R2689 Other abnormalities of gait and mobility: Secondary | ICD-10-CM | POA: Diagnosis not present

## 2017-04-11 DIAGNOSIS — M21372 Foot drop, left foot: Secondary | ICD-10-CM | POA: Diagnosis not present

## 2017-04-16 DIAGNOSIS — H04123 Dry eye syndrome of bilateral lacrimal glands: Secondary | ICD-10-CM | POA: Diagnosis not present

## 2017-04-18 DIAGNOSIS — R2689 Other abnormalities of gait and mobility: Secondary | ICD-10-CM | POA: Diagnosis not present

## 2017-04-18 DIAGNOSIS — M21372 Foot drop, left foot: Secondary | ICD-10-CM | POA: Diagnosis not present

## 2017-04-18 DIAGNOSIS — R278 Other lack of coordination: Secondary | ICD-10-CM | POA: Diagnosis not present

## 2017-05-03 ENCOUNTER — Other Ambulatory Visit: Payer: Self-pay | Admitting: Family Medicine

## 2017-05-29 ENCOUNTER — Other Ambulatory Visit: Payer: Self-pay | Admitting: Family Medicine

## 2017-05-29 NOTE — Telephone Encounter (Signed)
Last OV 02/09/2017   Last refilled 02/28/2017 disp 180 with no refills   Sent to PCP for approval

## 2017-07-31 ENCOUNTER — Ambulatory Visit (INDEPENDENT_AMBULATORY_CARE_PROVIDER_SITE_OTHER): Payer: Medicare HMO | Admitting: Family Medicine

## 2017-07-31 ENCOUNTER — Encounter: Payer: Self-pay | Admitting: Family Medicine

## 2017-07-31 VITALS — BP 124/70 | HR 84 | Temp 98.6°F | Ht 71.0 in | Wt 237.4 lb

## 2017-07-31 DIAGNOSIS — S39011A Strain of muscle, fascia and tendon of abdomen, initial encounter: Secondary | ICD-10-CM | POA: Diagnosis not present

## 2017-07-31 DIAGNOSIS — E785 Hyperlipidemia, unspecified: Secondary | ICD-10-CM | POA: Diagnosis not present

## 2017-07-31 DIAGNOSIS — E538 Deficiency of other specified B group vitamins: Secondary | ICD-10-CM

## 2017-07-31 DIAGNOSIS — R590 Localized enlarged lymph nodes: Secondary | ICD-10-CM | POA: Diagnosis not present

## 2017-07-31 DIAGNOSIS — R1310 Dysphagia, unspecified: Secondary | ICD-10-CM

## 2017-07-31 DIAGNOSIS — E291 Testicular hypofunction: Secondary | ICD-10-CM | POA: Diagnosis not present

## 2017-07-31 DIAGNOSIS — Z209 Contact with and (suspected) exposure to unspecified communicable disease: Secondary | ICD-10-CM | POA: Diagnosis not present

## 2017-07-31 LAB — CBC WITH DIFFERENTIAL/PLATELET
Basophils Absolute: 0.1 10*3/uL (ref 0.0–0.1)
Basophils Relative: 0.9 % (ref 0.0–3.0)
Eosinophils Absolute: 0.1 10*3/uL (ref 0.0–0.7)
Eosinophils Relative: 2 % (ref 0.0–5.0)
HCT: 51.4 % (ref 39.0–52.0)
Hemoglobin: 16.9 g/dL (ref 13.0–17.0)
Lymphocytes Relative: 17 % (ref 12.0–46.0)
Lymphs Abs: 1 10*3/uL (ref 0.7–4.0)
MCHC: 32.9 g/dL (ref 30.0–36.0)
MCV: 79.4 fl (ref 78.0–100.0)
Monocytes Absolute: 0.9 10*3/uL (ref 0.1–1.0)
Monocytes Relative: 14.3 % — ABNORMAL HIGH (ref 3.0–12.0)
Neutro Abs: 4 10*3/uL (ref 1.4–7.7)
Neutrophils Relative %: 65.8 % (ref 43.0–77.0)
Platelets: 240 10*3/uL (ref 150.0–400.0)
RBC: 6.47 Mil/uL — ABNORMAL HIGH (ref 4.22–5.81)
RDW: 18 % — ABNORMAL HIGH (ref 11.5–15.5)
WBC: 6.1 10*3/uL (ref 4.0–10.5)

## 2017-07-31 LAB — BASIC METABOLIC PANEL
BUN: 14 mg/dL (ref 6–23)
CO2: 31 mEq/L (ref 19–32)
Calcium: 9.7 mg/dL (ref 8.4–10.5)
Chloride: 101 mEq/L (ref 96–112)
Creatinine, Ser: 0.82 mg/dL (ref 0.40–1.50)
GFR: 103.56 mL/min (ref >60.00)
Glucose, Bld: 107 mg/dL — ABNORMAL HIGH (ref 70–99)
Potassium: 4.5 mEq/L (ref 3.5–5.1)
Sodium: 139 mEq/L (ref 135–145)

## 2017-07-31 LAB — PSA: PSA: 0 ng/mL — ABNORMAL LOW (ref 0.10–4.00)

## 2017-07-31 LAB — HEPATIC FUNCTION PANEL
ALT: 37 U/L (ref 0–53)
AST: 29 U/L (ref 0–37)
Albumin: 4.7 g/dL (ref 3.5–5.2)
Alkaline Phosphatase: 95 U/L (ref 39–117)
Bilirubin, Direct: 0.1 mg/dL (ref 0.0–0.3)
Total Bilirubin: 0.7 mg/dL (ref 0.2–1.2)
Total Protein: 7.7 g/dL (ref 6.0–8.3)

## 2017-07-31 LAB — LIPID PANEL
Cholesterol: 219 mg/dL — ABNORMAL HIGH (ref 0–200)
HDL: 40.5 mg/dL (ref >39.00)
LDL Cholesterol: 154 mg/dL — ABNORMAL HIGH (ref 0–99)
NonHDL: 178.64
Total CHOL/HDL Ratio: 5
Triglycerides: 124 mg/dL (ref 0.0–149.0)
VLDL: 24.8 mg/dL (ref 0.0–40.0)

## 2017-07-31 LAB — TESTOSTERONE: Testosterone: 320.08 ng/dL (ref 300.00–890.00)

## 2017-07-31 LAB — VITAMIN B12: Vitamin B-12: 782 pg/mL (ref 211–911)

## 2017-07-31 LAB — TSH: TSH: 1.55 u[IU]/mL (ref 0.35–4.50)

## 2017-07-31 MED ORDER — TRAMADOL HCL 50 MG PO TABS: 100.0000 mg | ORAL_TABLET | Freq: Four times a day (QID) | ORAL | 2 refills | Status: DC | PRN

## 2017-07-31 MED ORDER — AMOXICILLIN 500 MG PO TABS: ORAL_TABLET | ORAL | 5 refills | Status: DC

## 2017-07-31 NOTE — Progress Notes (Signed)
   Subjective:    Patient ID: [name], male    DOB: 11/01/1960, 57 y.o.   MRN: [id number]  HPI Here for several issues. First he is fasting and wants to check some labs today. Including lipids. He is working out and feels good. He wants to check a small lump on the neck that came up 6 months ago. It does not bother him. Also for 3 weeks he has had a small area on the lower abdomen that burns off and on. He has been doing a lot of abdominal muscle work lately like crunches and he is worried about a possible hernia. Also for 3 months he has had frequent coughing spells shortly after he eats a meal. This is not productive. No trouble swallowing. No heartburn.    Review of Systems  Constitutional: Negative.   Respiratory: Positive for cough. Negative for chest tightness, shortness of breath and wheezing.   Cardiovascular: Negative.   Gastrointestinal: Positive for abdominal pain. Negative for abdominal distention, anal bleeding, blood in stool, constipation, diarrhea, nausea, rectal pain and vomiting.  Genitourinary: Negative.   Neurological: Negative.        Objective:   Physical Exam  Constitutional: He appears well-developed and well-nourished.  Walks with a cane  Neck: Neck supple. No thyromegaly present.  Single nontender lymph node in the left posterior neck   Cardiovascular: Normal rate, regular rhythm, normal heart sounds and intact distal pulses.  Pulmonary/Chest: Effort normal and breath sounds normal. No stridor. No respiratory distress. He has no wheezes. He has no rales.  Abdominal: Soft. Bowel sounds are normal. He exhibits no distension and no mass. There is no rebound and no guarding. No hernia.  Slightly tender along the muscle wall of the right lower abdomen          Assessment & Plan:  He will get fasting labs today for lipids, etc. He has a benign posterior cervical lymph node. He will monitor this and recheck as needed. He seems to have some GERD so he will  try Omeprazole 20 mg daily. He has a mild muscle strain in the abdomen. He will rest for a few weeks to let this heal. We refilled Tramadol for back pain and Amoxicillin for dental work.  [name], MD

## 2017-08-01 LAB — HEPATITIS C ANTIBODY
Hepatitis C Ab: NONREACTIVE
SIGNAL TO CUT-OFF: 0.01 (ref <1.00)

## 2017-11-27 ENCOUNTER — Ambulatory Visit (INDEPENDENT_AMBULATORY_CARE_PROVIDER_SITE_OTHER): Payer: Medicare HMO | Admitting: Family Medicine

## 2017-11-27 ENCOUNTER — Encounter: Payer: Self-pay | Admitting: Family Medicine

## 2017-11-27 VITALS — BP 142/88 | HR 76 | Temp 98.2°F | Wt 238.1 lb

## 2017-11-27 DIAGNOSIS — Z209 Contact with and (suspected) exposure to unspecified communicable disease: Secondary | ICD-10-CM | POA: Diagnosis not present

## 2017-11-27 DIAGNOSIS — R739 Hyperglycemia, unspecified: Secondary | ICD-10-CM

## 2017-11-27 DIAGNOSIS — I10 Essential (primary) hypertension: Secondary | ICD-10-CM

## 2017-11-27 DIAGNOSIS — E291 Testicular hypofunction: Secondary | ICD-10-CM

## 2017-11-27 DIAGNOSIS — C61 Malignant neoplasm of prostate: Secondary | ICD-10-CM | POA: Diagnosis not present

## 2017-11-27 NOTE — Progress Notes (Signed)
   Subjective:    Patient ID: [name], male    DOB: 1960-07-28, 57 y.o.   MRN: [id number]  HPI Here to follow on issues. He feels well in general. In May we did labs which showed some elevated lipids and a borderline glucose, and he wants to recheck these. He is eating a very healthy diet. Also he is asking for a referral to see Dr. [facility]e [facility] supervise his testosterone replacement. Currently [name] is taking testosterone shots he buys off the black market and he is worried about the quality of what he receives. Apparently at this clinic he can obtain testosterone, estrogen blocking hormones, and HCG under close observation. Of note his last PSA here in May was 0.00.    Review of Systems  Constitutional: Negative.   Respiratory: Negative.   Cardiovascular: Negative.   Neurological: Negative.        Objective:   Physical Exam  Constitutional: He is oriented to person, place, and time. He appears well-developed and well-nourished.  Cardiovascular: Normal rate, regular rhythm, normal heart sounds and intact distal pulses.  Pulmonary/Chest: Effort normal and breath sounds normal.  Neurological: He is alert and oriented to person, place, and time.          Assessment & Plan:  His HTN is stable. We will set up fasting labs soon to check lipids and an A1c.  We will refer him to the clinic in [place].  [name], MD

## 2017-11-27 NOTE — Progress Notes (Signed)
  Subjective:     Patient ID: [name], male   DOB: 02-28-1961, 57 y.o.   MRN: [id number]  HPI Duplicate  Review of Systems     Objective:   Physical Exam     Assessment:         Plan:

## 2017-11-29 ENCOUNTER — Other Ambulatory Visit (INDEPENDENT_AMBULATORY_CARE_PROVIDER_SITE_OTHER): Payer: Medicare HMO

## 2017-11-29 DIAGNOSIS — R739 Hyperglycemia, unspecified: Secondary | ICD-10-CM

## 2017-11-29 DIAGNOSIS — C61 Malignant neoplasm of prostate: Secondary | ICD-10-CM | POA: Diagnosis not present

## 2017-11-29 DIAGNOSIS — Z209 Contact with and (suspected) exposure to unspecified communicable disease: Secondary | ICD-10-CM | POA: Diagnosis not present

## 2017-11-29 LAB — LIPID PANEL
Cholesterol: 177 mg/dL (ref 0–200)
HDL: 32.5 mg/dL — ABNORMAL LOW (ref >39.00)
LDL Cholesterol: 130 mg/dL — ABNORMAL HIGH (ref 0–99)
NonHDL: 144.68
Total CHOL/HDL Ratio: 5
Triglycerides: 75 mg/dL (ref 0.0–149.0)
VLDL: 15 mg/dL (ref 0.0–40.0)

## 2017-11-29 LAB — PSA: PSA: 0 ng/mL — ABNORMAL LOW (ref 0.10–4.00)

## 2017-11-29 LAB — HEMOGLOBIN A1C: Hgb A1c MFr Bld: 5.3 % (ref 4.6–6.5)

## 2017-11-30 LAB — HIV ANTIBODY (ROUTINE TESTING W REFLEX): HIV 1&2 Ab, 4th Generation: NONREACTIVE

## 2017-12-04 ENCOUNTER — Encounter: Payer: Self-pay | Admitting: *Deleted

## 2017-12-06 ENCOUNTER — Ambulatory Visit (INDEPENDENT_AMBULATORY_CARE_PROVIDER_SITE_OTHER): Payer: Medicare HMO | Admitting: Family Medicine

## 2017-12-06 ENCOUNTER — Encounter: Payer: Self-pay | Admitting: Family Medicine

## 2017-12-06 VITALS — BP 136/86 | HR 85 | Temp 98.6°F | Wt 239.2 lb

## 2017-12-06 DIAGNOSIS — Z9889 Other specified postprocedural states: Secondary | ICD-10-CM | POA: Diagnosis not present

## 2017-12-06 DIAGNOSIS — M48061 Spinal stenosis, lumbar region without neurogenic claudication: Secondary | ICD-10-CM | POA: Diagnosis not present

## 2017-12-06 DIAGNOSIS — D751 Secondary polycythemia: Secondary | ICD-10-CM

## 2017-12-06 NOTE — Progress Notes (Signed)
   Subjective:    Patient ID: [name], male    DOB: [birth year], 57 y.o.   MRN: [id number]  HPI Here for several issues. First he asks about donating blood. He has done this for years at the standard interval of 56 days. His Hgb here has always been in the range of 14 to 19. The last one drawn her on 07-31-17 was 18.0. Last week when he had a unit of blood drawn tey told him his Hgb was 18. This is often seen in patients who take testosterone treatments. The clinic gave him a form for [place] to sign to instruct them about donation orders. He feels fine. Also his disability insurance company has asked him about accepting a a settlement amount of money rather than covering him to the age of 46.    Review of Systems  Constitutional: Negative.   Respiratory: Negative.   Cardiovascular: Negative.   Musculoskeletal: Positive for back pain.  Neurological: Negative.        Objective:   Physical Exam  Constitutional: He is oriented to person, place, and time. He appears well-developed and well-nourished.  Cardiovascular: Normal rate, regular rhythm, normal heart sounds and intact distal pulses.  Pulmonary/Chest: Effort normal and breath sounds normal.  Neurological: He is alert and oriented to person, place, and time.          Assessment & Plan:  He has some polycythemia and it is likely related to the off label testosterone that he takes. I filled out is donation form to allow them to withdraw 500 cc of blood every 4 weeks. We will both monitor the Hgb. We will assist him with any forms needed for his disability.  [name], MD

## 2017-12-24 DIAGNOSIS — M25561 Pain in right knee: Secondary | ICD-10-CM | POA: Diagnosis not present

## 2017-12-24 DIAGNOSIS — M170 Bilateral primary osteoarthritis of knee: Secondary | ICD-10-CM | POA: Diagnosis not present

## 2017-12-24 DIAGNOSIS — M25562 Pain in left knee: Secondary | ICD-10-CM | POA: Diagnosis not present

## 2018-01-22 ENCOUNTER — Ambulatory Visit (INDEPENDENT_AMBULATORY_CARE_PROVIDER_SITE_OTHER): Payer: Medicare HMO | Admitting: Family Medicine

## 2018-01-22 ENCOUNTER — Encounter: Payer: Self-pay | Admitting: Family Medicine

## 2018-01-22 VITALS — BP 146/72 | HR 103 | Temp 98.6°F | Wt 241.2 lb

## 2018-01-22 DIAGNOSIS — Z23 Encounter for immunization: Secondary | ICD-10-CM

## 2018-01-22 DIAGNOSIS — E291 Testicular hypofunction: Secondary | ICD-10-CM

## 2018-01-22 DIAGNOSIS — Z0000 Encounter for general adult medical examination without abnormal findings: Secondary | ICD-10-CM

## 2018-01-22 MED ORDER — TRAMADOL HCL 50 MG PO TABS: 100.0000 mg | ORAL_TABLET | Freq: Four times a day (QID) | ORAL | 2 refills | Status: DC | PRN

## 2018-01-22 NOTE — Addendum Note (Signed)
Addended by: Elmer Picker on: 01/22/2018 11:41 AM   Modules accepted: Orders

## 2018-01-22 NOTE — Progress Notes (Signed)
   Subjective:    Patient ID: [name], male    DOB: 31-May-1960, 57 y.o.   MRN: [id number]  HPI Here to discuss his testosterone levels. He had been seeing a clinic in [place] for this but this was too expensive to continue. He wants [place] to take over treating this. He needs a flu shot.    Review of Systems  Constitutional: Negative.   Respiratory: Negative.   Cardiovascular: Negative.   Neurological: Negative.        Objective:   Physical Exam  Constitutional: He is oriented to person, place, and time. He appears well-developed and well-nourished.  Cardiovascular: Normal rate, regular rhythm, normal heart sounds and intact distal pulses.  Pulmonary/Chest: Effort normal and breath sounds normal.  Neurological: He is alert and oriented to person, place, and time.          Assessment & Plan:  Hypogonadism. We will check testosterone levels today. Given a flu shot. [name], MD

## 2018-01-23 LAB — TESTOSTERONE TOTAL,FREE,BIO, MALES
Albumin: 4.3 g/dL (ref 3.6–5.1)
Sex Hormone Binding: 13 nmol/L (ref 10–50)
Testosterone, Bioavailable: 433.5 ng/dL (ref >=110.0)
Testosterone, Free: 220.1 pg/mL (ref 46.0–224.0)
Testosterone: 758 ng/dL (ref 250–827)

## 2018-01-24 ENCOUNTER — Encounter: Payer: Self-pay | Admitting: *Deleted

## 2018-02-18 ENCOUNTER — Encounter: Payer: Self-pay | Admitting: Family Medicine

## 2018-02-18 ENCOUNTER — Ambulatory Visit (INDEPENDENT_AMBULATORY_CARE_PROVIDER_SITE_OTHER): Payer: Medicare HMO | Admitting: Family Medicine

## 2018-02-18 VITALS — BP 142/80 | HR 101 | Temp 97.8°F | Wt 237.5 lb

## 2018-02-18 DIAGNOSIS — E291 Testicular hypofunction: Secondary | ICD-10-CM | POA: Diagnosis not present

## 2018-02-18 MED ORDER — TESTOSTERONE CYPIONATE 200 MG/ML IM SOLN: 200.0000 mg | INTRAMUSCULAR | 5 refills | Status: DC

## 2018-02-18 NOTE — Progress Notes (Signed)
   Subjective:    Patient ID: [name], male    DOB: 1960-11-20, 57 y.o.   MRN: [id number]  HPI Here to discuss his lab results. He is taking 200 mg of testosterone every week and he feels great. His recent total testosterone level came back at 758.    Review of Systems  Constitutional: Negative.   Respiratory: Negative.   Cardiovascular: Negative.        Objective:   Physical Exam Constitutional:      Appearance: Normal appearance.  Cardiovascular:     Rate and Rhythm: Normal rate and regular rhythm.     Pulses: Normal pulses.     Heart sounds: Normal heart sounds.  Pulmonary:     Effort: Pulmonary effort is normal.     Breath sounds: Normal breath sounds.  Neurological:     Mental Status: He is alert.           Assessment & Plan:  Hypogonadism, well treated. His rx was refilled. We will recheck levels in 6 months.  [name], MD

## 2018-03-19 DIAGNOSIS — R69 Illness, unspecified: Secondary | ICD-10-CM | POA: Diagnosis not present

## 2018-03-26 HISTORY — PX: COLONOSCOPY: SHX174

## 2018-04-01 HISTORY — PX: COLONOSCOPY: SHX174

## 2018-05-05 ENCOUNTER — Telehealth: Payer: Self-pay | Admitting: Family Medicine

## 2018-05-05 NOTE — Telephone Encounter (Signed)
°  Called pt  Left a msg on vm need to know what is he needed because I do not see a referral in his chart for him to go to [facility] in [place] need him to advise on this   Copied from [place] [id number]. Topic: Referral - Status >> Dec 17, 2017 10:20 AM Bea Graff, NT wrote: Reason for CRM: Pt states that [facility] in [place] states they need a hard copy of the referral for this pt to go to them next week. Pt would like to see if he can pick that up in office to take with them? Please advise.

## 2018-05-05 NOTE — Telephone Encounter (Signed)
Ok will note the chart I saw this message from the [facility] will note the chart

## 2018-05-05 NOTE — Telephone Encounter (Addendum)
This request was from a CRM created 12/17/17. Pt does not see [facility] anymore and does not need anything from them.  Nothing further needed.

## 2018-05-06 DIAGNOSIS — D120 Benign neoplasm of cecum: Secondary | ICD-10-CM | POA: Diagnosis not present

## 2018-05-06 DIAGNOSIS — K648 Other hemorrhoids: Secondary | ICD-10-CM | POA: Diagnosis not present

## 2018-05-06 DIAGNOSIS — D125 Benign neoplasm of sigmoid colon: Secondary | ICD-10-CM | POA: Diagnosis not present

## 2018-05-06 DIAGNOSIS — Z1211 Encounter for screening for malignant neoplasm of colon: Secondary | ICD-10-CM | POA: Diagnosis not present

## 2018-05-06 DIAGNOSIS — K5730 Diverticulosis of large intestine without perforation or abscess without bleeding: Secondary | ICD-10-CM | POA: Diagnosis not present

## 2018-05-07 ENCOUNTER — Encounter: Payer: Self-pay | Admitting: Family Medicine

## 2018-05-07 ENCOUNTER — Ambulatory Visit (INDEPENDENT_AMBULATORY_CARE_PROVIDER_SITE_OTHER): Payer: Medicare HMO | Admitting: Family Medicine

## 2018-05-07 VITALS — BP 140/70 | HR 74 | Temp 98.8°F | Ht 69.5 in | Wt 246.2 lb

## 2018-05-07 DIAGNOSIS — J069 Acute upper respiratory infection, unspecified: Secondary | ICD-10-CM

## 2018-05-07 NOTE — Progress Notes (Signed)
   Subjective:    Patient ID: [name], male    DOB: 16-Jul-1960, 58 y.o.   MRN: [id number]  HPI Here for 3 days of chills without a fever, a dry cough, and ST. Taking Nyquil.    Review of Systems  Constitutional: Positive for chills. Negative for fever.  HENT: Positive for congestion, postnasal drip and sore throat. Negative for sinus pressure and sinus pain.   Eyes: Negative.   Respiratory: Positive for cough.   Gastrointestinal: Negative.        Objective:   Physical Exam Constitutional:      Appearance: Normal appearance. He is not ill-appearing.  HENT:     Right Ear: Tympanic membrane and ear canal normal.     Left Ear: Tympanic membrane and ear canal normal.     Nose: Nose normal.     Mouth/Throat:     Pharynx: Oropharynx is clear.  Eyes:     Conjunctiva/sclera: Conjunctivae normal.  Pulmonary:     Effort: Pulmonary effort is normal.     Breath sounds: Normal breath sounds.  Lymphadenopathy:     Cervical: No cervical adenopathy.  Neurological:     Mental Status: He is alert.           Assessment & Plan:  Viral URI. Rest and drink fluids. Use Tylenol prn.  [name], MD

## 2018-05-09 DIAGNOSIS — D120 Benign neoplasm of cecum: Secondary | ICD-10-CM | POA: Diagnosis not present

## 2018-05-09 DIAGNOSIS — D125 Benign neoplasm of sigmoid colon: Secondary | ICD-10-CM | POA: Diagnosis not present

## 2018-05-29 ENCOUNTER — Telehealth: Payer: Self-pay | Admitting: Family Medicine

## 2018-05-29 NOTE — Telephone Encounter (Signed)
Copied from [place] [phone]. Topic: Quick Communication - See Telephone Encounter >> May 29, 2018  9:28 AM Rayann Heman wrote: CRM for notification. See Telephone encounter for: 05/29/18. Pt called and stated that he came in for an appointment and stated that he had a sore throat about 2 weeks go.  Pt states that he is still not feeling well and throat pain is worse. He would like to know if something could be called in. Please advise

## 2018-05-30 MED ORDER — AZITHROMYCIN 250 MG PO TABS: ORAL_TABLET | ORAL | 0 refills | Status: DC

## 2018-05-30 NOTE — Telephone Encounter (Signed)
RX has been sent in.  

## 2018-05-30 NOTE — Telephone Encounter (Signed)
Call in a Zpack  ?

## 2018-06-04 ENCOUNTER — Ambulatory Visit: Payer: Self-pay | Admitting: *Deleted

## 2018-06-04 NOTE — Telephone Encounter (Signed)
Pt states "Not feeling any better after antibiotics." States was seen 3/4 2020 by Dr. Sarajane Jews, viral URI. Placed on Z-Pack 05/29/2018, completed yesterday. Calling today to report throat remains sore, LGT of 99.0 daily with max temp of 100.0 2 nights ago. States remains at 99.0 after tylenol. Reports night sweats, decreased energy. States mild productive cough, whitish phlegm. Also reports "Little stuffy, eyes watery." Decreased energy.  Pts email verified, phone # verified, has smart phone. Care advise given per protocol.  Please advise regarding appt  Has not traveled, no known exposure. CB [phone]  Reason for Disposition . [1] Caller requesting NON-URGENT health information AND [2] PCP's office is the best resource  Answer Assessment - Initial Assessment Questions 1. REASON FOR CALL or QUESTION: "What is your reason for calling today?" or "How can I best help you?" or "What question do you have that I can help answer?"     Not feeling any better after antibiotics  Protocols used: INFORMATION ONLY CALL-A-AH

## 2018-06-05 NOTE — Telephone Encounter (Signed)
Called and spoke with pt and he stated that he is feeling some better today.  He will see if he feels better tomorrow and if not he will call back for appt.

## 2018-06-09 ENCOUNTER — Encounter: Payer: Self-pay | Admitting: Family Medicine

## 2018-06-09 ENCOUNTER — Ambulatory Visit (INDEPENDENT_AMBULATORY_CARE_PROVIDER_SITE_OTHER): Payer: Medicare HMO | Admitting: Family Medicine

## 2018-06-09 ENCOUNTER — Other Ambulatory Visit: Payer: Self-pay | Admitting: Family Medicine

## 2018-06-09 ENCOUNTER — Other Ambulatory Visit: Payer: Self-pay

## 2018-06-09 DIAGNOSIS — J069 Acute upper respiratory infection, unspecified: Secondary | ICD-10-CM | POA: Diagnosis not present

## 2018-06-09 NOTE — Progress Notes (Signed)
Subjective:    Patient ID: [name], male    DOB: 11/16/1960, 58 y.o.   MRN: [id number]  HPI Virtual Visit via Video Note  I connected with the patient on 06/09/18 at  9:15 AM EDT by a video enabled telemedicine application and verified that I am speaking with the correct person using two identifiers.  Location patient: home Location provider:work or home office Persons participating in the virtual visit: patient, provider  I discussed the limitations of evaluation and management by telemedicine and the availability of in person appointments. The patient expressed understanding and agreed to proceed.   HPI: He wanted to talk about his symptoms that have been going on now for 4 weeks. He has low grade fevers (99.4), body aches, and a dry cough. No chest pain or SOB. No NVD. On 05-29-18 he tried a Zpack, but this has not helped. He is using Tylenol, Mucinex DM, and Nyquil. He has been checking his O2 sats and they average 98%.    ROS: See pertinent positives and negatives per HPI.  Past Medical History:  Diagnosis Date  . Arthritis   . Cancer [facility])    prostate, sees Dr. Risa Grill   . Colon polyps   . Degenerative disc disease, cervical   . Degenerative disc disease, lumbar   . Depression   . GERD (gastroesophageal reflux disease)   . Low back pain   . Osteopenia     Past Surgical History:  Procedure Laterality Date  . CERVICAL FUSION  2015   C3 through C7, per Dr. Harl Bowie   . COLONOSCOPY  2010   per a GI in [place], clear   . CYSTECTOMY     benign, right hip  . GYNECOMASTIA EXCISION     bilateral, reduction  . JOINT REPLACEMENT  12-20-11   hx. LTHA, now RTHA planned  . LAMINECTOMY  2/09   lumbar  . LUMBAR FUSION  2015   L4 through S1, per Dr. Harl Bowie   . LUMBAR MICRODISCECTOMY  10/15/08   L-4-5, per Dr. Jenne Campus at [facility]  . PROSTATE SURGERY  12-13-10   robotic prostatectomy per Dr. Risa Grill   . TOTAL HIP ARTHROPLASTY  12-20-11   2010 left  hip  . TOTAL HIP ARTHROPLASTY  12/26/2011   Procedure: TOTAL HIP ARTHROPLASTY;  Surgeon: Gearlean Alf, MD;  Location: [facility];  Service: Orthopedics;  Laterality: Right;    Family History  Problem Relation Age of Onset  . Hypertension Father   . Suicidality Father   . Healthy Mother   . Arthritis Unknown   . Cancer Brother        Bone cancer     Current Outpatient Medications:  .  amoxicillin (AMOXIL) 500 MG tablet, Take 4 tablets prior to dental work, Disp: 4 tablet, Rfl: 5 .  aspirin 81 MG tablet, Take 81 mg by mouth daily., Disp: , Rfl:  .  diclofenac (VOLTAREN) 75 MG EC tablet, TAKE 1 TABLET (75 MG TOTAL) BY MOUTH 2 (TWO) TIMES DAILY., Disp: 180 tablet, Rfl: 3 .  testosterone cypionate (DEPOTESTOSTERONE CYPIONATE) 200 MG/ML injection, Inject 1 mL (200 mg total) into the muscle every 7 (seven) days., Disp: 10 mL, Rfl: 5 .  traMADol (ULTRAM) 50 MG tablet, Take 2 tablets (100 mg total) by mouth every 6 (six) hours as needed for moderate pain., Disp: 240 tablet, Rfl: 2  EXAM:  VITALS per patient if applicable:  GENERAL: alert, oriented, appears well and in no acute distress  HEENT: atraumatic, conjunttiva clear, no obvious abnormalities on inspection of external nose and ears  NECK: normal movements of the head and neck  LUNGS: on inspection no signs of respiratory distress, breathing rate appears normal, no obvious gross SOB, gasping or wheezing  CV: no obvious cyanosis  MS: moves all visible extremities without noticeable abnormality  PSYCH/NEURO: pleasant and cooperative, no obvious depression or anxiety, speech and thought processing grossly intact  ASSESSMENT AND PLAN: He still has typical symptoms of a viral illness, and I agreed with him that this could be the result of a covid19 infection. However I think if he were to become very ill from this it would have happened by now. He will continue to rest and stay home, and hopefully he will feel better soon. Recheck  prn.  [name], MD  Discussed the following assessment and plan:  No diagnosis found.     I discussed the assessment and treatment plan with the patient. The patient was provided an opportunity to ask questions and all were answered. The patient agreed with the plan and demonstrated an understanding of the instructions.   The patient was advised to call back or seek an in-person evaluation if the symptoms worsen or if the condition fails to improve as anticipated.     Review of Systems     Objective:   Physical Exam        Assessment & Plan:

## 2018-06-19 ENCOUNTER — Encounter: Payer: Self-pay | Admitting: Family Medicine

## 2018-06-19 ENCOUNTER — Other Ambulatory Visit: Payer: Self-pay

## 2018-06-19 ENCOUNTER — Ambulatory Visit (INDEPENDENT_AMBULATORY_CARE_PROVIDER_SITE_OTHER): Payer: Medicare HMO | Admitting: Family Medicine

## 2018-06-19 DIAGNOSIS — R05 Cough: Secondary | ICD-10-CM

## 2018-06-19 DIAGNOSIS — E785 Hyperlipidemia, unspecified: Secondary | ICD-10-CM

## 2018-06-19 DIAGNOSIS — K219 Gastro-esophageal reflux disease without esophagitis: Secondary | ICD-10-CM

## 2018-06-19 DIAGNOSIS — N138 Other obstructive and reflux uropathy: Secondary | ICD-10-CM

## 2018-06-19 DIAGNOSIS — E291 Testicular hypofunction: Secondary | ICD-10-CM

## 2018-06-19 DIAGNOSIS — M48061 Spinal stenosis, lumbar region without neurogenic claudication: Secondary | ICD-10-CM

## 2018-06-19 DIAGNOSIS — R053 Chronic cough: Secondary | ICD-10-CM

## 2018-06-19 DIAGNOSIS — J3089 Other allergic rhinitis: Secondary | ICD-10-CM

## 2018-06-19 DIAGNOSIS — N401 Enlarged prostate with lower urinary tract symptoms: Secondary | ICD-10-CM | POA: Diagnosis not present

## 2018-06-19 DIAGNOSIS — R6889 Other general symptoms and signs: Secondary | ICD-10-CM

## 2018-06-19 DIAGNOSIS — Z9889 Other specified postprocedural states: Secondary | ICD-10-CM

## 2018-06-19 NOTE — Progress Notes (Signed)
Subjective:    Patient ID: [name], male    DOB: [birth year], 58 y.o.   MRN: [id number]  HPI Virtual Visit via Video Note  I connected with the patient on 06/19/18 at  8:30 AM EDT by a video enabled telemedicine application and verified that I am speaking with the correct person using two identifiers.  Location patient: home Location provider:work or home office Persons participating in the virtual visit: patient, provider  I discussed the limitations of evaluation and management by telemedicine and the availability of in person appointments. The patient expressed understanding and agreed to proceed.   HPI: Here for several issues. First he still has a chronic dry cough, and this has been going on for about 6 weeks. When this started he also had body aches and low grade fevers, so on 05-30-18 he was given a Zpack. After that the fevers and body aches resolved but the cough has remained. He feels well in general. The cough is worse after eating a meal and when he is lying in bed. He is using Mucinex DM and Nyquil. He notes that last year he took OTC Prilosec to help with GERD, and he wonders if the cough could partly be the result of GERD. Also he needs help with his disability certification. He has been on permanent total disability after his spine surgery, and in 2019 he got a letter from his surgeon, Dr. Harl Bowie, certifying that he would never be able to work again. Now the insurance company is requiring him to rpoduce all his medical records with [place] from 03-05-17 to today, and they are requiring a letter from his PCP certifying his disability status.    ROS: See pertinent positives and negatives per HPI.  Past Medical History:  Diagnosis Date  . Arthritis   . Cancer [facility])    prostate, sees Dr. Risa Grill   . Colon polyps   . Degenerative disc disease, cervical   . Degenerative disc disease, lumbar   . Depression   . GERD (gastroesophageal reflux disease)   . Low back pain   .  Osteopenia     Past Surgical History:  Procedure Laterality Date  . CERVICAL FUSION  2015   C3 through C7, per Dr. Harl Bowie   . COLONOSCOPY  2010   per a GI in [place], clear   . CYSTECTOMY     benign, right hip  . GYNECOMASTIA EXCISION     bilateral, reduction  . JOINT REPLACEMENT  12-20-11   hx. LTHA, now RTHA planned  . LAMINECTOMY  2/09   lumbar  . LUMBAR FUSION  2015   L4 through S1, per Dr. Harl Bowie   . LUMBAR MICRODISCECTOMY  10/15/08   L-4-5, per Dr. Jenne Campus at [facility]  . PROSTATE SURGERY  12-13-10   robotic prostatectomy per Dr. Risa Grill   . TOTAL HIP ARTHROPLASTY  12-20-11   2010 left hip  . TOTAL HIP ARTHROPLASTY  12/26/2011   Procedure: TOTAL HIP ARTHROPLASTY;  Surgeon: Gearlean Alf, MD;  Location: [facility];  Service: Orthopedics;  Laterality: Right;    Family History  Problem Relation Age of Onset  . Hypertension Father   . Suicidality Father   . Healthy Mother   . Arthritis Unknown   . Cancer Brother        Bone cancer     Current Outpatient Medications:  .  amoxicillin (AMOXIL) 500 MG tablet, Take 4 tablets prior to dental work, Disp: 4 tablet, Rfl: 5 .  aspirin 81 MG tablet, Take 81 mg by mouth daily., Disp: , Rfl:  .  diclofenac (VOLTAREN) 75 MG EC tablet, TAKE 1 TABLET (75 MG TOTAL) BY MOUTH 2 (TWO) TIMES DAILY., Disp: 180 tablet, Rfl: 3 .  testosterone cypionate (DEPOTESTOSTERONE CYPIONATE) 200 MG/ML injection, Inject 1 mL (200 mg total) into the muscle every 7 (seven) days., Disp: 10 mL, Rfl: 5 .  traMADol (ULTRAM) 50 MG tablet, Take 2 tablets (100 mg total) by mouth every 6 (six) hours as needed for moderate pain., Disp: 240 tablet, Rfl: 2  EXAM:  VITALS per patient if applicable:  GENERAL: alert, oriented, appears well and in no acute distress  HEENT: atraumatic, conjunttiva clear, no obvious abnormalities on inspection of external nose and ears  NECK: normal movements of the head and neck  LUNGS: on inspection no signs  of respiratory distress, breathing rate appears normal, no obvious gross SOB, gasping or wheezing  CV: no obvious cyanosis  MS: moves all visible extremities without noticeable abnormality  PSYCH/NEURO: pleasant and cooperative, no obvious depression or anxiety, speech and thought processing grossly intact  ASSESSMENT AND PLAN: His viral URI seems to have resolved but he still has a chronic dry cough. This could be the result of seasonal allergies or of GERD, possibly a combination of both. I advised him to get back on Prilosec every day and he will add Claritin daily. We will also send in some Benzonatate to use prn. We will fax in copies of his medical records to his insurance company and we will dictate a letter certifying his disability is total and is permanent.  [name], MD  Discussed the following assessment and plan:  No diagnosis found.     I discussed the assessment and treatment plan with the patient. The patient was provided an opportunity to ask questions and all were answered. The patient agreed with the plan and demonstrated an understanding of the instructions.   The patient was advised to call back or seek an in-person evaluation if the symptoms worsen or if the condition fails to improve as anticipated.     Review of Systems     Objective:   Physical Exam        Assessment & Plan:

## 2018-07-01 NOTE — Addendum Note (Signed)
Addended by: [name] A on: 07/01/2018 11:24 AM   Modules accepted: Orders

## 2018-07-01 NOTE — Addendum Note (Signed)
Addended by: [name] A on: 07/01/2018 11:25 AM   Modules accepted: Orders

## 2018-07-03 ENCOUNTER — Other Ambulatory Visit: Payer: Self-pay

## 2018-07-03 ENCOUNTER — Other Ambulatory Visit (INDEPENDENT_AMBULATORY_CARE_PROVIDER_SITE_OTHER): Payer: Medicare HMO

## 2018-07-03 DIAGNOSIS — E785 Hyperlipidemia, unspecified: Secondary | ICD-10-CM

## 2018-07-03 DIAGNOSIS — N138 Other obstructive and reflux uropathy: Secondary | ICD-10-CM | POA: Diagnosis not present

## 2018-07-03 DIAGNOSIS — E291 Testicular hypofunction: Secondary | ICD-10-CM | POA: Diagnosis not present

## 2018-07-03 DIAGNOSIS — R6889 Other general symptoms and signs: Secondary | ICD-10-CM | POA: Diagnosis not present

## 2018-07-03 DIAGNOSIS — N401 Enlarged prostate with lower urinary tract symptoms: Secondary | ICD-10-CM | POA: Diagnosis not present

## 2018-07-03 LAB — CBC WITH DIFFERENTIAL/PLATELET
Basophils Absolute: 0.1 10*3/uL (ref 0.0–0.1)
Basophils Relative: 1.2 % (ref 0.0–3.0)
Eosinophils Absolute: 0.1 10*3/uL (ref 0.0–0.7)
Eosinophils Relative: 2.8 % (ref 0.0–5.0)
HCT: 44.7 % (ref 39.0–52.0)
Hemoglobin: 14.7 g/dL (ref 13.0–17.0)
Lymphocytes Relative: 21.6 % (ref 12.0–46.0)
Lymphs Abs: 1.1 10*3/uL (ref 0.7–4.0)
MCHC: 32.9 g/dL (ref 30.0–36.0)
MCV: 74.5 fl — ABNORMAL LOW (ref 78.0–100.0)
Monocytes Absolute: 0.7 10*3/uL (ref 0.1–1.0)
Monocytes Relative: 13.5 % — ABNORMAL HIGH (ref 3.0–12.0)
Neutro Abs: 3 10*3/uL (ref 1.4–7.7)
Neutrophils Relative %: 60.9 % (ref 43.0–77.0)
Platelets: 250 10*3/uL (ref 150.0–400.0)
RBC: 6 Mil/uL — ABNORMAL HIGH (ref 4.22–5.81)
RDW: 20.4 % — ABNORMAL HIGH (ref 11.5–15.5)
WBC: 5 10*3/uL (ref 4.0–10.5)

## 2018-07-03 LAB — TESTOSTERONE: Testosterone: 1191.66 ng/dL — ABNORMAL HIGH (ref 300.00–890.00)

## 2018-07-03 LAB — LIPID PANEL
Cholesterol: 192 mg/dL (ref 0–200)
HDL: 27.9 mg/dL — ABNORMAL LOW (ref >39.00)
LDL Cholesterol: 140 mg/dL — ABNORMAL HIGH (ref 0–99)
NonHDL: 163.97
Total CHOL/HDL Ratio: 7
Triglycerides: 118 mg/dL (ref 0.0–149.0)
VLDL: 23.6 mg/dL (ref 0.0–40.0)

## 2018-07-03 LAB — HEPATIC FUNCTION PANEL
ALT: 28 U/L (ref 0–53)
AST: 28 U/L (ref 0–37)
Albumin: 4.2 g/dL (ref 3.5–5.2)
Alkaline Phosphatase: 78 U/L (ref 39–117)
Bilirubin, Direct: 0.1 mg/dL (ref 0.0–0.3)
Total Bilirubin: 0.6 mg/dL (ref 0.2–1.2)
Total Protein: 6.6 g/dL (ref 6.0–8.3)

## 2018-07-03 LAB — PSA: PSA: 0 ng/mL — ABNORMAL LOW (ref 0.10–4.00)

## 2018-07-04 LAB — SAR COV2 SEROLOGY (COVID19)AB(IGG),IA: SARS CoV2 AB IGG: NEGATIVE

## 2018-07-08 ENCOUNTER — Encounter: Payer: Self-pay | Admitting: *Deleted

## 2018-07-10 ENCOUNTER — Other Ambulatory Visit: Payer: Self-pay | Admitting: Family Medicine

## 2018-07-11 NOTE — Telephone Encounter (Signed)
Dr. Fry please advise on refill. Thanks  

## 2018-07-14 NOTE — Telephone Encounter (Signed)
Call in #[phone] with 2 rf

## 2018-07-14 NOTE — Telephone Encounter (Signed)
Refill has been called to the pharmacy.  

## 2018-08-04 ENCOUNTER — Other Ambulatory Visit: Payer: Self-pay | Admitting: Family Medicine

## 2018-09-17 DIAGNOSIS — N5231 Erectile dysfunction following radical prostatectomy: Secondary | ICD-10-CM | POA: Diagnosis not present

## 2018-09-17 DIAGNOSIS — N486 Induration penis plastica: Secondary | ICD-10-CM | POA: Diagnosis not present

## 2018-09-17 DIAGNOSIS — C61 Malignant neoplasm of prostate: Secondary | ICD-10-CM | POA: Diagnosis not present

## 2018-09-29 ENCOUNTER — Encounter: Payer: Self-pay | Admitting: Family Medicine

## 2018-09-29 ENCOUNTER — Other Ambulatory Visit: Payer: Self-pay | Admitting: Family Medicine

## 2018-09-29 NOTE — Telephone Encounter (Signed)
RX was accidentally printed.  Last filled 02/18/18 Last OV 06/19/2018  Ok to fill?

## 2018-09-29 NOTE — Addendum Note (Signed)
Addended by: Rebecca Eaton on: 09/29/2018 03:15 PM   Modules accepted: Orders

## 2018-10-06 NOTE — Telephone Encounter (Signed)
Please advise. Patient is needing refills on Diclofenac.

## 2018-10-07 ENCOUNTER — Encounter: Payer: Self-pay | Admitting: Family Medicine

## 2018-10-07 MED ORDER — TESTOSTERONE CYPIONATE 200 MG/ML IM SOLN: 200.0000 mg | INTRAMUSCULAR | 5 refills | Status: DC

## 2018-10-07 NOTE — Telephone Encounter (Signed)
I sent in the refills  

## 2018-10-31 ENCOUNTER — Encounter: Payer: Self-pay | Admitting: Family Medicine

## 2018-10-31 ENCOUNTER — Telehealth (INDEPENDENT_AMBULATORY_CARE_PROVIDER_SITE_OTHER): Payer: Medicare HMO | Admitting: Family Medicine

## 2018-10-31 ENCOUNTER — Other Ambulatory Visit: Payer: Self-pay

## 2018-10-31 DIAGNOSIS — E291 Testicular hypofunction: Secondary | ICD-10-CM

## 2018-10-31 DIAGNOSIS — K219 Gastro-esophageal reflux disease without esophagitis: Secondary | ICD-10-CM | POA: Diagnosis not present

## 2018-10-31 MED ORDER — OMEPRAZOLE 40 MG PO CPDR: 40.0000 mg | DELAYED_RELEASE_CAPSULE | Freq: Every day | ORAL | 3 refills | Status: DC

## 2018-10-31 MED ORDER — TESTOSTERONE CYPIONATE 200 MG/ML IM SOLN: 200.0000 mg | INTRAMUSCULAR | 1 refills | Status: DC

## 2018-10-31 NOTE — Progress Notes (Signed)
Virtual Visit via Video Note  I connected with the patient on 10/31/18 at 11:15 AM EDT by a video enabled telemedicine application and verified that I am speaking with the correct person using two identifiers.  Location patient: home Location provider:work or home office Persons participating in the virtual visit: patient, provider  I discussed the limitations of evaluation and management by telemedicine and the availability of in person appointments. The patient expressed understanding and agreed to proceed.   HPI: Here for 2 issues. First he wants to go back to dosing the testosterone weekly instead of biweekly. He had been doing this earlier in the spring, and he felt great. Then we got a high level when we measured it in April, so I told him to go back down to one shot every 2 weeks. Since then he has been very unhappy. He feels tired all the time, and his libido has disappeared. Of note, when he came in to have the level drawn in April this was the next day after he had given himself a shot. Also he has had more belching lately with no change in diet. He takes Omeprazole 20 mg daily along with a probiotic. BMs are normal.    ROS: See pertinent positives and negatives per HPI.  Past Medical History:  Diagnosis Date  . Arthritis   . Cancer [facility])    prostate, sees Dr. Risa Grill   . Colon polyps   . Degenerative disc disease, cervical   . Degenerative disc disease, lumbar   . Depression   . GERD (gastroesophageal reflux disease)   . Low back pain   . Osteopenia     Past Surgical History:  Procedure Laterality Date  . CERVICAL FUSION  2015   C3 through C7, per Dr. Harl Bowie   . COLONOSCOPY  2010   per a GI in [place], clear   . CYSTECTOMY     benign, right hip  . GYNECOMASTIA EXCISION     bilateral, reduction  . JOINT REPLACEMENT  12-20-11   hx. LTHA, now RTHA planned  . LAMINECTOMY  2/09   lumbar  . LUMBAR FUSION  2015   L4 through S1, per Dr. Harl Bowie   . LUMBAR  MICRODISCECTOMY  10/15/08   L-4-5, per Dr. Jenne Campus at [facility]  . PROSTATE SURGERY  12-13-10   robotic prostatectomy per Dr. Risa Grill   . TOTAL HIP ARTHROPLASTY  12-20-11   2010 left hip  . TOTAL HIP ARTHROPLASTY  12/26/2011   Procedure: TOTAL HIP ARTHROPLASTY;  Surgeon: Gearlean Alf, MD;  Location: [facility];  Service: Orthopedics;  Laterality: Right;    Family History  Problem Relation Age of Onset  . Hypertension Father   . Suicidality Father   . Healthy Mother   . Arthritis Other   . Cancer Brother        Bone cancer     Current Outpatient Medications:  .  aspirin 81 MG tablet, Take 81 mg by mouth daily., Disp: , Rfl:  .  diclofenac (VOLTAREN) 75 MG EC tablet, TAKE 1 TABLET (75 MG TOTAL) BY MOUTH 2 (TWO) TIMES DAILY., Disp: 180 tablet, Rfl: 3 .  omeprazole (PRILOSEC) 40 MG capsule, Take 1 capsule (40 mg total) by mouth daily., Disp: 90 capsule, Rfl: 3 .  testosterone cypionate (DEPOTESTOSTERONE CYPIONATE) 200 MG/ML injection, Inject 1 mL (200 mg total) into the muscle every 7 (seven) days., Disp: 12 mL, Rfl: 1 .  traMADol (ULTRAM) 50 MG tablet, TAKE 2 TABLETS (100  MG TOTAL) BY MOUTH EVERY 6 (SIX) HOURS AS NEEDED FOR MODERATE PAIN., Disp: 240 tablet, Rfl: 2  EXAM:  VITALS per patient if applicable:  GENERAL: alert, oriented, appears well and in no acute distress  HEENT: atraumatic, conjunttiva clear, no obvious abnormalities on inspection of external nose and ears  NECK: normal movements of the head and neck  LUNGS: on inspection no signs of respiratory distress, breathing rate appears normal, no obvious gross SOB, gasping or wheezing  CV: no obvious cyanosis  MS: moves all visible extremities without noticeable abnormality  PSYCH/NEURO: pleasant and cooperative, no obvious depression or anxiety, speech and thought processing grossly intact  ASSESSMENT AND PLAN: For the hypogonadism, we will go back to dosing the testosterone every week. After 3  months we will check another level, but we will get a trough level this time by taking it 6 days after a shot. For the GERD he will increase the Omeprazole to 40 mg daily.  [name], MD  Discussed the following assessment and plan:  No diagnosis found.     I discussed the assessment and treatment plan with the patient. The patient was provided an opportunity to ask questions and all were answered. The patient agreed with the plan and demonstrated an understanding of the instructions.   The patient was advised to call back or seek an in-person evaluation if the symptoms worsen or if the condition fails to improve as anticipated.

## 2018-11-04 ENCOUNTER — Encounter: Payer: Self-pay | Admitting: Family Medicine

## 2018-11-07 DIAGNOSIS — H04123 Dry eye syndrome of bilateral lacrimal glands: Secondary | ICD-10-CM | POA: Diagnosis not present

## 2018-11-25 ENCOUNTER — Telehealth: Payer: Self-pay | Admitting: Family Medicine

## 2018-11-25 NOTE — Telephone Encounter (Signed)
Patient dropped off a [employer] form   Fax form to: [phone]   ATTN: [employer]  Disposition: Dr's folder

## 2018-11-25 NOTE — Telephone Encounter (Signed)
Noted  

## 2018-12-16 ENCOUNTER — Other Ambulatory Visit: Payer: Self-pay | Admitting: Family Medicine

## 2018-12-18 NOTE — Telephone Encounter (Signed)
Okay for refill? Please advise 

## 2018-12-19 ENCOUNTER — Telehealth: Payer: Self-pay | Admitting: Family Medicine

## 2018-12-19 NOTE — Telephone Encounter (Signed)
Copied from [place] [id number]. Topic: Quick Communication - Rx Refill/Question >> Dec 19, 2018  1:52 PM Izola Price, [place] A wrote: Medication: traMADol (ULTRAM) 50 MG tablet (Patient stated that he only has 2 pills remaining and would like his request to be expedited for his medication.)  Has the patient contacted their pharmacy? {Yes (Agent: If no, request that the patient contact the pharmacy for the refill.) (Agent: If yes, when and what did the pharmacy advise?)Contact PCP  Preferred Pharmacy (with phone number or street name): [id number] [place], [place] [phone] (Phone) [phone] (Fax)    Agent: Please be advised that RX refills may take up to 3 business days. We ask that you follow-up with your pharmacy.

## 2018-12-19 NOTE — Telephone Encounter (Signed)
Rx approval sent to Dr.Fry

## 2018-12-22 ENCOUNTER — Other Ambulatory Visit: Payer: Self-pay | Admitting: Family Medicine

## 2018-12-22 DIAGNOSIS — H1013 Acute atopic conjunctivitis, bilateral: Secondary | ICD-10-CM | POA: Diagnosis not present

## 2018-12-22 DIAGNOSIS — R69 Illness, unspecified: Secondary | ICD-10-CM | POA: Diagnosis not present

## 2018-12-22 MED ORDER — TRAMADOL HCL 50 MG PO TABS: 100.0000 mg | ORAL_TABLET | Freq: Four times a day (QID) | ORAL | 0 refills | Status: DC | PRN

## 2018-12-22 NOTE — Telephone Encounter (Signed)
I called the pt and informed him of the message below. 

## 2018-12-22 NOTE — Telephone Encounter (Signed)
Patient called in to check on status of this medication as he is completely out. Please advise.

## 2018-12-22 NOTE — Telephone Encounter (Signed)
Dr. Ethlyn Gallery please adise. Rx refill request was sent to PCP 3 days ago

## 2018-12-22 NOTE — Telephone Encounter (Signed)
I have sent in 1 mo supply for him.

## 2019-02-13 ENCOUNTER — Other Ambulatory Visit: Payer: Self-pay

## 2019-02-13 DIAGNOSIS — Z20822 Contact with and (suspected) exposure to covid-19: Secondary | ICD-10-CM

## 2019-02-15 LAB — NOVEL CORONAVIRUS, NAA: SARS-CoV-2, NAA: NOT DETECTED

## 2019-02-17 ENCOUNTER — Other Ambulatory Visit: Payer: Self-pay | Admitting: Family Medicine

## 2019-02-18 NOTE — Telephone Encounter (Signed)
Last filled 12/22/2018 Last OV 06/19/2018  Ok to fill?

## 2019-02-19 DIAGNOSIS — Z20828 Contact with and (suspected) exposure to other viral communicable diseases: Secondary | ICD-10-CM | POA: Diagnosis not present

## 2019-04-08 DIAGNOSIS — Z03818 Encounter for observation for suspected exposure to other biological agents ruled out: Secondary | ICD-10-CM | POA: Diagnosis not present

## 2019-04-08 DIAGNOSIS — Z20828 Contact with and (suspected) exposure to other viral communicable diseases: Secondary | ICD-10-CM | POA: Diagnosis not present

## 2019-04-13 ENCOUNTER — Encounter: Payer: Self-pay | Admitting: Family Medicine

## 2019-04-13 ENCOUNTER — Other Ambulatory Visit: Payer: Self-pay

## 2019-04-13 NOTE — Telephone Encounter (Signed)
Per 07/03/2018 labs  Laurey Morale, MD  07/07/2018 12:29 PM EDT    His Covid-19 test is negative. PSA is zero. Cholesterol is high so watch a strict diet. His testosterone level is high so decrease the shots to every 14 days   Rx in chart still says "every 7 days." Please advise.

## 2019-04-16 ENCOUNTER — Other Ambulatory Visit: Payer: Self-pay | Admitting: Family Medicine

## 2019-04-16 MED ORDER — HYDROCORTISONE (PERIANAL) 2.5 % EX CREA: 1.0000 "application " | TOPICAL_CREAM | Freq: Two times a day (BID) | CUTANEOUS | 5 refills | Status: DC

## 2019-04-16 NOTE — Telephone Encounter (Signed)
Noted. Awaiting Dr. Barbie Banner response.

## 2019-04-16 NOTE — Telephone Encounter (Signed)
Done

## 2019-04-16 NOTE — Telephone Encounter (Signed)
Pt called in to check to see if Dr. Sarajane Jews received his request to fill his Testosterione Cypionate 200 MG.  Informed pt that it was sent again to [facility] this morning

## 2019-04-17 MED ORDER — TESTOSTERONE CYPIONATE 200 MG/ML IM SOLN: 200.0000 mg | INTRAMUSCULAR | 1 refills | Status: DC

## 2019-04-17 NOTE — Telephone Encounter (Signed)
I spoke to him about the testosterone level. We agreed to keep the dosing at every 7 days for now and we will check it again in a few months

## 2019-04-17 NOTE — Addendum Note (Signed)
Addended by: [name] A on: 04/17/2019 06:32 AM   Modules accepted: Orders

## 2019-04-17 NOTE — Telephone Encounter (Signed)
Done

## 2019-04-17 NOTE — Telephone Encounter (Signed)
[facility] that prescription needs to be called in. Pt said he does not mind coming to pick up script and taking it to the pharmacy if it is easier.

## 2019-04-17 NOTE — Telephone Encounter (Signed)
Please advise. The pharmacy will not accept this over the phone from me.

## 2019-04-24 ENCOUNTER — Telehealth: Payer: Self-pay | Admitting: Family Medicine

## 2019-04-24 MED ORDER — OMEPRAZOLE 40 MG PO CPDR: 40.0000 mg | DELAYED_RELEASE_CAPSULE | Freq: Every day | ORAL | 3 refills | Status: DC

## 2019-04-24 MED ORDER — TRAMADOL HCL 50 MG PO TABS: 100.0000 mg | ORAL_TABLET | Freq: Four times a day (QID) | ORAL | 0 refills | Status: DC | PRN

## 2019-04-24 MED ORDER — DICLOFENAC SODIUM 75 MG PO TBEC: 75.0000 mg | DELAYED_RELEASE_TABLET | Freq: Two times a day (BID) | ORAL | 3 refills | Status: DC

## 2019-04-24 NOTE — Telephone Encounter (Signed)
These were sent to [facility]. Please cancel all remaining refills for the Tramadol at his local pharmacy

## 2019-04-24 NOTE — Telephone Encounter (Signed)
Noted. Prescriptions have been canceled.

## 2019-04-24 NOTE — Telephone Encounter (Signed)
Tramadol last filled 02/18/2019 Last OV 10/31/2018  Ok to fill?  Diclofenac filled 04/16/2019 omeprazole filled 10/31/2018

## 2019-04-24 NOTE — Telephone Encounter (Signed)
Medication Refil: Tramadol 50 mg Diclofenas 75 mg Omeprazole 40 mg  Pharmacy: [facility]: [phone] Phone: [phone]

## 2019-05-09 DIAGNOSIS — Z20828 Contact with and (suspected) exposure to other viral communicable diseases: Secondary | ICD-10-CM | POA: Diagnosis not present

## 2019-05-09 DIAGNOSIS — Z03818 Encounter for observation for suspected exposure to other biological agents ruled out: Secondary | ICD-10-CM | POA: Diagnosis not present

## 2019-07-21 ENCOUNTER — Other Ambulatory Visit: Payer: Self-pay

## 2019-07-22 ENCOUNTER — Encounter: Payer: Self-pay | Admitting: Family Medicine

## 2019-07-22 ENCOUNTER — Ambulatory Visit (INDEPENDENT_AMBULATORY_CARE_PROVIDER_SITE_OTHER): Payer: Medicare HMO | Admitting: Family Medicine

## 2019-07-22 VITALS — BP 140/70 | HR 92 | Temp 98.2°F | Wt 246.8 lb

## 2019-07-22 DIAGNOSIS — Z0000 Encounter for general adult medical examination without abnormal findings: Secondary | ICD-10-CM | POA: Diagnosis not present

## 2019-07-22 LAB — LIPID PANEL
Cholesterol: 199 mg/dL (ref 0–200)
HDL: 33.2 mg/dL — ABNORMAL LOW (ref >39.00)
LDL Cholesterol: 145 mg/dL — ABNORMAL HIGH (ref 0–99)
NonHDL: 165.91
Total CHOL/HDL Ratio: 6
Triglycerides: 103 mg/dL (ref 0.0–149.0)
VLDL: 20.6 mg/dL (ref 0.0–40.0)

## 2019-07-22 LAB — HEPATIC FUNCTION PANEL
ALT: 41 U/L (ref 0–53)
AST: 34 U/L (ref 0–37)
Albumin: 4.6 g/dL (ref 3.5–5.2)
Alkaline Phosphatase: 90 U/L (ref 39–117)
Bilirubin, Direct: 0.1 mg/dL (ref 0.0–0.3)
Total Bilirubin: 0.7 mg/dL (ref 0.2–1.2)
Total Protein: 7.1 g/dL (ref 6.0–8.3)

## 2019-07-22 LAB — CBC WITH DIFFERENTIAL/PLATELET
Basophils Absolute: 0.1 10*3/uL (ref 0.0–0.1)
Basophils Relative: 1.3 % (ref 0.0–3.0)
Eosinophils Absolute: 0.2 10*3/uL (ref 0.0–0.7)
Eosinophils Relative: 2.7 % (ref 0.0–5.0)
HCT: 38.1 % — ABNORMAL LOW (ref 39.0–52.0)
Hemoglobin: 12.1 g/dL — ABNORMAL LOW (ref 13.0–17.0)
Lymphocytes Relative: 15.6 % (ref 12.0–46.0)
Lymphs Abs: 0.9 10*3/uL (ref 0.7–4.0)
MCHC: 31.9 g/dL (ref 30.0–36.0)
MCV: 67.9 fl — ABNORMAL LOW (ref 78.0–100.0)
Monocytes Absolute: 1 10*3/uL (ref 0.1–1.0)
Monocytes Relative: 16.5 % — ABNORMAL HIGH (ref 3.0–12.0)
Neutro Abs: 3.9 10*3/uL (ref 1.4–7.7)
Neutrophils Relative %: 63.9 % (ref 43.0–77.0)
Platelets: 311 10*3/uL (ref 150.0–400.0)
RBC: 5.61 Mil/uL (ref 4.22–5.81)
RDW: 20.8 % — ABNORMAL HIGH (ref 11.5–15.5)
WBC: 6 10*3/uL (ref 4.0–10.5)

## 2019-07-22 LAB — BASIC METABOLIC PANEL
BUN: 14 mg/dL (ref 6–23)
CO2: 29 mEq/L (ref 19–32)
Calcium: 9.4 mg/dL (ref 8.4–10.5)
Chloride: 103 mEq/L (ref 96–112)
Creatinine, Ser: 1.01 mg/dL (ref 0.40–1.50)
GFR: 76.06 mL/min (ref >60.00)
Glucose, Bld: 85 mg/dL (ref 70–99)
Potassium: 4.8 mEq/L (ref 3.5–5.1)
Sodium: 137 mEq/L (ref 135–145)

## 2019-07-22 LAB — TSH: TSH: 1.73 u[IU]/mL (ref 0.35–4.50)

## 2019-07-22 LAB — PSA: PSA: 0 ng/mL — ABNORMAL LOW (ref 0.10–4.00)

## 2019-07-22 LAB — TESTOSTERONE: Testosterone: 423.21 ng/dL (ref 300.00–890.00)

## 2019-07-22 NOTE — Progress Notes (Signed)
   Subjective:    Patient ID: [name], male    DOB: [birth year], 59 y.o.   MRN: [id number]  HPI Here for a well exam. He feels fine.    Review of Systems  Constitutional: Negative.   HENT: Negative.   Eyes: Negative.   Respiratory: Negative.   Cardiovascular: Negative.   Gastrointestinal: Negative.   Genitourinary: Negative.   Musculoskeletal: Negative.   Skin: Negative.   Neurological: Negative.   Psychiatric/Behavioral: Negative.        Objective:   Physical Exam Constitutional:      General: He is not in acute distress.    Appearance: He is well-developed. He is not diaphoretic.  HENT:     Head: Normocephalic and atraumatic.     Right Ear: External ear normal.     Left Ear: External ear normal.     Nose: Nose normal.     Mouth/Throat:     Pharynx: No oropharyngeal exudate.  Eyes:     General: No scleral icterus.       Right eye: No discharge.        Left eye: No discharge.     Conjunctiva/sclera: Conjunctivae normal.     Pupils: Pupils are equal, round, and reactive to light.  Neck:     Thyroid: No thyromegaly.     Vascular: No JVD.     Trachea: No tracheal deviation.  Cardiovascular:     Rate and Rhythm: Normal rate and regular rhythm.     Heart sounds: Normal heart sounds. No murmur. No friction rub. No gallop.   Pulmonary:     Effort: Pulmonary effort is normal. No respiratory distress.     Breath sounds: Normal breath sounds. No wheezing or rales.  Chest:     Chest wall: No tenderness.  Abdominal:     General: Bowel sounds are normal. There is no distension.     Palpations: Abdomen is soft. There is no mass.     Tenderness: There is no abdominal tenderness. There is no guarding or rebound.  Genitourinary:    Penis: Normal. No tenderness.      Testes: Normal.  Musculoskeletal:        General: No tenderness. Normal range of motion.     Cervical back: Neck supple.  Lymphadenopathy:     Cervical: No cervical adenopathy.  Skin:    General: Skin  is warm and dry.     Coloration: Skin is not pale.     Findings: No erythema or rash.  Neurological:     Mental Status: He is alert and oriented to person, place, and time.     Cranial Nerves: No cranial nerve deficit.     Motor: No abnormal muscle tone.     Coordination: Coordination normal.     Deep Tendon Reflexes: Reflexes are normal and symmetric. Reflexes normal.  Psychiatric:        Behavior: Behavior normal.        Thought Content: Thought content normal.        Judgment: Judgment normal.           Assessment & Plan:  Well exam. We discussed diet and exercise. Get fasting labs.  [name], MD

## 2019-08-05 ENCOUNTER — Other Ambulatory Visit: Payer: Self-pay | Admitting: Family Medicine

## 2019-08-06 MED ORDER — TRAMADOL HCL 50 MG PO TABS: 100.0000 mg | ORAL_TABLET | Freq: Four times a day (QID) | ORAL | 1 refills | Status: DC | PRN

## 2019-08-06 NOTE — Telephone Encounter (Signed)
Okay to refill. Last OV 07/22/2019

## 2019-08-13 DIAGNOSIS — Z03818 Encounter for observation for suspected exposure to other biological agents ruled out: Secondary | ICD-10-CM | POA: Diagnosis not present

## 2019-08-13 DIAGNOSIS — Z20822 Contact with and (suspected) exposure to covid-19: Secondary | ICD-10-CM | POA: Diagnosis not present

## 2019-08-21 ENCOUNTER — Encounter: Payer: Self-pay | Admitting: Family Medicine

## 2019-08-21 MED ORDER — AMOXICILLIN 500 MG PO CAPS
ORAL_CAPSULE | ORAL | 3 refills | Status: DC
Start: 2019-08-21 — End: 2019-09-08

## 2019-08-21 NOTE — Telephone Encounter (Signed)
Please advise. Medication is not on the current med list.

## 2019-08-21 NOTE — Telephone Encounter (Signed)
Done

## 2019-09-07 ENCOUNTER — Other Ambulatory Visit: Payer: Self-pay | Admitting: Family Medicine

## 2019-09-08 ENCOUNTER — Other Ambulatory Visit: Payer: Self-pay

## 2019-09-08 ENCOUNTER — Emergency Department (HOSPITAL_COMMUNITY): Payer: Medicare HMO

## 2019-09-08 ENCOUNTER — Encounter (HOSPITAL_COMMUNITY): Payer: Self-pay | Admitting: Emergency Medicine

## 2019-09-08 ENCOUNTER — Observation Stay (HOSPITAL_COMMUNITY)
Admission: EM | Admit: 2019-09-08 | Discharge: 2019-09-09 | Disposition: A | Discharge status: Home / Self Care | Payer: Medicare HMO | Attending: Cardiovascular Disease | Admitting: Cardiovascular Disease

## 2019-09-08 DIAGNOSIS — Z209 Contact with and (suspected) exposure to unspecified communicable disease: Secondary | ICD-10-CM | POA: Diagnosis not present

## 2019-09-08 DIAGNOSIS — I4891 Unspecified atrial fibrillation: Secondary | ICD-10-CM | POA: Diagnosis not present

## 2019-09-08 DIAGNOSIS — Z20822 Contact with and (suspected) exposure to covid-19: Secondary | ICD-10-CM | POA: Insufficient documentation

## 2019-09-08 DIAGNOSIS — R000 Tachycardia, unspecified: Secondary | ICD-10-CM | POA: Diagnosis not present

## 2019-09-08 DIAGNOSIS — R002 Palpitations: Secondary | ICD-10-CM | POA: Diagnosis not present

## 2019-09-08 DIAGNOSIS — Z7982 Long term (current) use of aspirin: Secondary | ICD-10-CM | POA: Diagnosis not present

## 2019-09-08 DIAGNOSIS — R21 Rash and other nonspecific skin eruption: Secondary | ICD-10-CM | POA: Diagnosis not present

## 2019-09-08 DIAGNOSIS — I10 Essential (primary) hypertension: Secondary | ICD-10-CM | POA: Diagnosis not present

## 2019-09-08 LAB — CBC WITH DIFFERENTIAL/PLATELET
Abs Immature Granulocytes: 0.01 10*3/uL (ref 0.00–0.07)
Basophils Absolute: 0 10*3/uL (ref 0.0–0.1)
Basophils Relative: 1 %
Eosinophils Absolute: 0.1 10*3/uL (ref 0.0–0.5)
Eosinophils Relative: 2 %
HCT: 45.6 % (ref 39.0–52.0)
Hemoglobin: 13.7 g/dL (ref 13.0–17.0)
Immature Granulocytes: 0 %
Lymphocytes Relative: 18 %
Lymphs Abs: 1 10*3/uL (ref 0.7–4.0)
MCH: 20.9 pg — ABNORMAL LOW (ref 26.0–34.0)
MCHC: 30 g/dL (ref 30.0–36.0)
MCV: 69.5 fL — ABNORMAL LOW (ref 80.0–100.0)
Monocytes Absolute: 0.8 10*3/uL (ref 0.1–1.0)
Monocytes Relative: 15 %
Neutro Abs: 3.6 10*3/uL (ref 1.7–7.7)
Neutrophils Relative %: 64 %
Platelets: 290 10*3/uL (ref 150–400)
RBC: 6.56 MIL/uL — ABNORMAL HIGH (ref 4.22–5.81)
RDW: 21.2 % — ABNORMAL HIGH (ref 11.5–15.5)
WBC: 5.6 10*3/uL (ref 4.0–10.5)
nRBC: 0 % (ref 0.0–0.2)

## 2019-09-08 LAB — PROTIME-INR
INR: 1.1 (ref 0.8–1.2)
Prothrombin Time: 13.4 seconds (ref 11.4–15.2)

## 2019-09-08 LAB — COMPREHENSIVE METABOLIC PANEL
ALT: 29 U/L (ref 0–44)
AST: 25 U/L (ref 15–41)
Albumin: 3.7 g/dL (ref 3.5–5.0)
Alkaline Phosphatase: 76 U/L (ref 38–126)
Anion gap: 9 (ref 5–15)
BUN: 7 mg/dL (ref 6–20)
CO2: 25 mmol/L (ref 22–32)
Calcium: 8.8 mg/dL — ABNORMAL LOW (ref 8.9–10.3)
Chloride: 106 mmol/L (ref 98–111)
Creatinine, Ser: 0.98 mg/dL (ref 0.61–1.24)
GFR calc Af Amer: >60 mL/min (ref >60)
GFR calc non Af Amer: >60 mL/min (ref >60)
Glucose, Bld: 124 mg/dL — ABNORMAL HIGH (ref 70–99)
Potassium: 3.8 mmol/L (ref 3.5–5.1)
Sodium: 140 mmol/L (ref 135–145)
Total Bilirubin: 0.7 mg/dL (ref 0.3–1.2)
Total Protein: 7.1 g/dL (ref 6.5–8.1)

## 2019-09-08 LAB — URINALYSIS, ROUTINE W REFLEX MICROSCOPIC
Bilirubin Urine: NEGATIVE
Glucose, UA: NEGATIVE mg/dL
Hgb urine dipstick: NEGATIVE
Ketones, ur: NEGATIVE mg/dL
Leukocytes,Ua: NEGATIVE
Nitrite: NEGATIVE
Protein, ur: NEGATIVE mg/dL
Specific Gravity, Urine: 1.001 — ABNORMAL LOW (ref 1.005–1.030)
pH: 7 (ref 5.0–8.0)

## 2019-09-08 LAB — RAPID URINE DRUG SCREEN, HOSP PERFORMED
Amphetamines: NOT DETECTED
Barbiturates: NOT DETECTED
Benzodiazepines: NOT DETECTED
Cocaine: NOT DETECTED
Opiates: NOT DETECTED
Tetrahydrocannabinol: NOT DETECTED

## 2019-09-08 LAB — MAGNESIUM: Magnesium: 2 mg/dL (ref 1.7–2.4)

## 2019-09-08 LAB — PHOSPHORUS: Phosphorus: 1.9 mg/dL — ABNORMAL LOW (ref 2.5–4.6)

## 2019-09-08 LAB — TROPONIN I (HIGH SENSITIVITY)
Troponin I (High Sensitivity): 11 ng/L (ref <18)
Troponin I (High Sensitivity): 14 ng/L (ref <18)

## 2019-09-08 LAB — CK: Total CK: 364 U/L (ref 49–397)

## 2019-09-08 LAB — SARS CORONAVIRUS 2 BY RT PCR (HOSPITAL ORDER, PERFORMED IN ~~LOC~~ HOSPITAL LAB): SARS Coronavirus 2: NEGATIVE

## 2019-09-08 LAB — TSH: TSH: 3.985 u[IU]/mL (ref 0.350–4.500)

## 2019-09-08 MED ORDER — HEPARIN BOLUS VIA INFUSION
4000.0000 [IU] | Freq: Once | INTRAVENOUS | Status: DC
  Filled 2019-09-08: qty 4000

## 2019-09-08 MED ORDER — DICLOFENAC SODIUM 75 MG PO TBEC
75.0000 mg | DELAYED_RELEASE_TABLET | Freq: Two times a day (BID) | ORAL | Status: DC
  Administered 2019-09-08: 75 mg via ORAL
  Filled 2019-09-08 (×2): qty 1

## 2019-09-08 MED ORDER — ACETAMINOPHEN 325 MG PO TABS: 650.0000 mg | ORAL_TABLET | ORAL | Status: DC | PRN

## 2019-09-08 MED ORDER — TRAMADOL HCL 50 MG PO TABS
100.0000 mg | ORAL_TABLET | Freq: Four times a day (QID) | ORAL | Status: DC | PRN
  Administered 2019-09-08 – 2019-09-09 (×2): 100 mg via ORAL
  Filled 2019-09-08 (×2): qty 2

## 2019-09-08 MED ORDER — ADENOSINE 6 MG/2ML IV SOLN: 6.0000 mg | Freq: Once | INTRAVENOUS | Status: DC

## 2019-09-08 MED ORDER — DILTIAZEM HCL-DEXTROSE 125-5 MG/125ML-% IV SOLN (PREMIX)
5.0000 mg/h | INTRAVENOUS | Status: DC
  Administered 2019-09-08: 5 mg/h via INTRAVENOUS
  Administered 2019-09-08: 15 mg/h via INTRAVENOUS
  Filled 2019-09-08: qty 125

## 2019-09-08 MED ORDER — PANTOPRAZOLE SODIUM 40 MG PO TBEC
40.0000 mg | DELAYED_RELEASE_TABLET | Freq: Every day | ORAL | Status: DC
  Filled 2019-09-08: qty 1

## 2019-09-08 MED ORDER — APIXABAN 5 MG PO TABS
5.0000 mg | ORAL_TABLET | Freq: Two times a day (BID) | ORAL | Status: DC
  Administered 2019-09-08: 5 mg via ORAL
  Filled 2019-09-08 (×2): qty 1

## 2019-09-08 MED ORDER — SODIUM CHLORIDE 0.9 % IV BOLUS (SEPSIS)
250.0000 mL | Freq: Once | INTRAVENOUS | Status: AC
  Administered 2019-09-08: 250 mL via INTRAVENOUS

## 2019-09-08 MED ORDER — DILTIAZEM LOAD VIA INFUSION
20.0000 mg | Freq: Once | INTRAVENOUS | Status: AC
  Administered 2019-09-08: 20 mg via INTRAVENOUS
  Filled 2019-09-08: qty 20

## 2019-09-08 MED ORDER — HEPARIN (PORCINE) 25000 UT/250ML-% IV SOLN
1500.0000 [IU]/h | INTRAVENOUS | Status: DC
  Filled 2019-09-08: qty 250

## 2019-09-08 MED ORDER — SODIUM CHLORIDE 0.9 % IV SOLN
1000.0000 mL | INTRAVENOUS | Status: DC
  Administered 2019-09-08: 1000 mL via INTRAVENOUS

## 2019-09-08 MED ORDER — ONDANSETRON HCL 4 MG/2ML IJ SOLN: 4.0000 mg | Freq: Four times a day (QID) | INTRAMUSCULAR | Status: DC | PRN

## 2019-09-08 NOTE — ED Triage Notes (Signed)
Pt here from home with c/o afib with rvr , pt received 30 mg from [facility] by ems , pt has no hx of afib

## 2019-09-08 NOTE — ED Provider Notes (Signed)
[facility] EMERGENCY DEPARTMENT Provider Note   CSN: [id number] Arrival date & time: 09/08/19  1447     History Chief Complaint  Patient presents with  . Atrial Fibrillation    [name] is a 59 y.o. male.  HPI Experienced episode of racing heart today.  No prior history of atrial fibrillation.  Prior history of cardiac disease.  Patient reports that he does work out routinely.  He exercises without any episodes of chest pain or shortness of breath.  He has had some mild cold symptoms over the past several days.  He has had some nasal congestion and sore throat.  Last week he had some cough but that is improving.  No fevers myalgias.  Patient reports that he did take guaifenesin and NyQuil today.  He also routinely takes prescribed testosterone.  Patient takes tramadol for back pain.  No drugs of abuse.  Patient is non-smoker.  He denies any problems with swelling or pain in the legs.  No associated headache, visual changes.  He reports he has chronic numbness in both feet due to prior back surgery.  No new weakness numbness or tingling.  EMS reports patient had heart rate in the 180s and 170s on arrival.  He administered 20 mg of Cardizem and a subsequent 10 mg dose.  Rates have slowed somewhat to the 130s-150s.  Patient denies sensation of feeling lightheaded, short of breath or chest pain.  No change in symptoms.  Family history: Negative for early cardiac disease or sudden death.  Patient's father took medications for a heart problem but died in his 40s due to suicide.  Patient has 2 brothers, 1 died from suicide the other from cancer.  Patient has a sister in her 65s without significant medical problems.  No one with sudden cardiac death to the patient's knowledge.      Past Medical History:  Diagnosis Date  . Arthritis   . Cancer [facility])    prostate, sees Dr. Risa Grill   . Colon polyps   . Degenerative disc disease, cervical   . Degenerative disc disease,  lumbar   . Depression   . GERD (gastroesophageal reflux disease)   . Low back pain   . Osteopenia     Patient Active Problem List   Diagnosis Date Noted  . Chronic narcotic use 02/08/2017  . Bilateral leg weakness 05/24/2016  . HTN (hypertension) 02/04/2015  . Other reduced mobility 04/21/2014  . Left foot drop 04/12/2014  . H/O arthrodesis 10/19/2013  . Cervical osteoarthritis 09/23/2013  . Status post lumbar spine operation 12/09/2012  . History of cervical spinal surgery 12/09/2012  . Lumbar canal stenosis 08/15/2012  . Degeneration of intervertebral disc of lumbosacral region 08/15/2012  . OA (osteoarthritis) of hip 12/26/2011  . Nerve root pain 11/12/2011  . Prostate cancer ([place]) 01/23/2011  . IRRITABLE BOWEL SYNDROME 08/24/2009  . LOW BACK PAIN 01/11/2009  . BREAST PAIN 01/21/2008  . OSTEOPENIA 10/07/2007  . Hypogonadism in male 05/16/2007  . Dyslipidemia 05/16/2007  . OSTEOPOROSIS 05/16/2007  . Depression 02/13/2007  . GERD 02/13/2007  . OSTEOARTHRITIS 02/13/2007  . 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 COLONIC POLYPS, HX OF 02/13/2007    Past Surgical History:  Procedure Laterality Date  . CERVICAL FUSION  2015   C3 through C7, per Dr. Harl Bowie   . COLONOSCOPY  03/26/2018    per Dr. Alessandra Bevels, adenomatous polyps, repeat in 3 yrs   . CYSTECTOMY     benign, right hip  .  GYNECOMASTIA EXCISION     bilateral, reduction  . JOINT REPLACEMENT  12-20-11   hx. LTHA, now RTHA planned  . LAMINECTOMY  2/09   lumbar  . LUMBAR FUSION  2015   L4 through S1, per Dr. Harl Bowie   . LUMBAR MICRODISCECTOMY  10/15/08   L-4-5, per Dr. Jenne Campus at [facility]  . PROSTATE SURGERY  12-13-10   robotic prostatectomy per Dr. Risa Grill   . TOTAL HIP ARTHROPLASTY  12-20-11   2010 left hip  . TOTAL HIP ARTHROPLASTY  12/26/2011   Procedure: TOTAL HIP ARTHROPLASTY;  Surgeon: Gearlean Alf, MD;  Location: [facility] OR[facility];  Service: Orthopedics;  Laterality: Right;       Family History    Problem Relation Age of Onset  . Hypertension Father   . Suicidality Father   . Healthy Mother   . Arthritis Other   . Cancer Brother        Bone cancer    Social History   Tobacco Use  . Smoking status: Never Smoker  . Smokeless tobacco: Never Used  Substance Use Topics  . Alcohol use: No    Alcohol/week: 0.0 standard drinks    Comment: rare  . Drug use: No    Home Medications Prior to Admission medications   Medication Sig Start Date End Date Taking? Authorizing Provider  aspirin 81 MG tablet Take 81 mg by mouth daily.   Yes [provider]  cholecalciferol (VITAMIN D3) 25 MCG (1000 UNIT) tablet Take 5,000 Units by mouth daily.   Yes [provider]  diclofenac (VOLTAREN) 75 MG EC tablet Take 1 tablet (75 mg total) by mouth 2 (two) times daily. 04/24/19  Yes Laurey Morale, MD  Guaifenesin 1200 MG TB12 Take 600 mg by mouth daily.   Yes [provider]  omeprazole (PRILOSEC) 40 MG capsule Take 1 capsule (40 mg total) by mouth daily. 04/24/19  Yes Laurey Morale, MD  testosterone cypionate (DEPOTESTOSTERONE CYPIONATE) 200 MG/ML injection Inject 1 mL (200 mg total) into the muscle every 7 (seven) days. Patient taking differently: Inject 200 mg into the muscle every 7 (seven) days. Wednesdays. 04/17/19  Yes Laurey Morale, MD  traMADol (ULTRAM) 50 MG tablet Take 2 tablets (100 mg total) by mouth every 6 (six) hours as needed. for pain 08/06/19  Yes Laurey Morale, MD  amoxicillin (AMOXIL) 500 MG capsule Take 4 capsules prior to dental procedures Patient not taking: Reported on 09/08/2019 08/21/19   Laurey Morale, MD  hydrocortisone (ANUSOL-HC) 2.5 % rectal cream Place 1 application rectally 2 (two) times daily. Patient not taking: Reported on 09/08/2019 04/16/19   Laurey Morale, MD    Allergies    Flexeril [cyclobenzaprine], Oxycodone-acetaminophen, and Oxycodone-acetaminophen  Review of Systems   Review of Systems 10 systems reviewed and negative except  as per HPI Physical Exam Updated Vital Signs BP 123/82   Pulse 79   Resp 17   SpO2 97%   Physical Exam Constitutional:      Comments: Alert nontoxic.  No respiratory distress.  Mental status clear.  HENT:     Head: Normocephalic and atraumatic.     Mouth/Throat:     Mouth: Mucous membranes are moist.     Pharynx: Oropharynx is clear.  Eyes:     Conjunctiva/sclera: Conjunctivae normal.  Cardiovascular:     Comments: Extreme tachycardia.  No appreciable rub murmur gallop.  Distal pulses intact. Pulmonary:     Effort: Pulmonary effort is normal.  Breath sounds: Normal breath sounds.  Abdominal:     General: There is no distension.     Palpations: Abdomen is soft.     Tenderness: There is no abdominal tenderness. There is no guarding.  Musculoskeletal:        General: No swelling or tenderness. Normal range of motion.     Cervical back: Neck supple.     Right lower leg: No edema.     Left lower leg: No edema.  Skin:    General: Skin is warm and dry.  Neurological:     General: No focal deficit present.     Mental Status: He is oriented to person, place, and time.     Cranial Nerves: No cranial nerve deficit.     Coordination: Coordination normal.  Psychiatric:        Mood and Affect: Mood normal.     ED Results / Procedures / Treatments   Labs (all labs ordered are listed, but only abnormal results are displayed) Labs Reviewed  CBC WITH DIFFERENTIAL/PLATELET - Abnormal; Notable for the following components:      Result Value   RBC 6.56 (*)    MCV 69.5 (*)    MCH 20.9 (*)    RDW 21.2 (*)    All other components within normal limits  URINALYSIS, ROUTINE W REFLEX MICROSCOPIC - Abnormal; Notable for the following components:   Color, Urine COLORLESS (*)    Specific Gravity, Urine 1.001 (*)    All other components within normal limits  COMPREHENSIVE METABOLIC PANEL - Abnormal; Notable for the following components:   Glucose, Bld 124 (*)    Calcium 8.8 (*)     All other components within normal limits  PHOSPHORUS - Abnormal; Notable for the following components:   Phosphorus 1.9 (*)    All other components within normal limits  SARS CORONAVIRUS 2 BY RT PCR (HOSPITAL ORDER, [place] [facility])  PROTIME-INR  RAPID URINE DRUG SCREEN, HOSP PERFORMED  TSH  CK  MAGNESIUM  HEPARIN LEVEL (UNFRACTIONATED)  HEPARIN LEVEL (UNFRACTIONATED)  CBC  TROPONIN I (HIGH SENSITIVITY)  TROPONIN I (HIGH SENSITIVITY)    EKG EKG Interpretation  Date/Time:  Tuesday September 08 2019 15:00:46 EDT Ventricular Rate:  155 PR Interval:    QRS Duration: 99 QT Interval:  295 QTC Calculation: 474 R Axis:   15 Text Interpretation: Afiv vs SVT Ventricular premature complex Probable anteroseptal infarct, old ST depression, probably rate related Confirmed by Charlesetta Shanks [phone]) on 09/08/2019 3:21:27 PM   Radiology DG Chest Port 1 View  Result Date: 09/08/2019 CLINICAL DATA:  59 year old male with tachycardia. EXAM: PORTABLE CHEST 1 VIEW COMPARISON:  Chest radiograph dated 11/16/2013. FINDINGS: There is mild elevation of the left hemidiaphragm. No focal consolidation, pleural effusion, or pneumothorax. There is mild chronic interstitial coarsening and bronchitic changes. There is mild cardiomegaly, with increased cardiopericardial silhouette since the prior radiograph. No acute osseous pathology. Lower cervical ACDF and partially visualized lower thoracic spinal fusion. IMPRESSION: 1. No acute cardiopulmonary process. 2. Mild cardiomegaly, new since the prior radiograph. Electronically Signed   By: Anner Crete M.D.   On: 09/08/2019 15:34    Procedures Procedures (including critical care time) CRITICAL CARE Performed by: Charlesetta Shanks   Total critical care time: 30 minutes  Critical care time was exclusive of separately billable procedures and treating other patients.  Critical care was necessary to treat or prevent imminent or life-threatening  deterioration.  Critical care was time spent personally by  me on the following activities: development of treatment plan with patient and/or surrogate as well as nursing, discussions with consultants, evaluation of patient's response to treatment, examination of patient, obtaining history from patient or surrogate, ordering and performing treatments and interventions, ordering and review of laboratory studies, ordering and review of radiographic studies, pulse oximetry and re-evaluation of patient's condition. Medications Ordered in ED Medications  sodium chloride 0.9 % bolus 250 mL (0 mLs Intravenous Stopped 09/08/19 1706)    Followed by  0.9 %  sodium chloride infusion (1,000 mLs Intravenous New Bag/Given 09/08/19 1539)  diltiazem (CARDIZEM) 1 mg/mL load via infusion 20 mg (20 mg Intravenous Bolus from Bag 09/08/19 1547)    And  diltiazem (CARDIZEM) 125 mg in dextrose 5% 125 mL (1 mg/mL) infusion (10 mg/hr Intravenous Rate/Dose Change 09/08/19 1757)  apixaban (ELIQUIS) tablet 5 mg (has no administration in time range)    ED Course  I have reviewed the triage vital signs and the nursing notes.  Pertinent labs & imaging results that were available during my care of the patient were reviewed by me and considered in my medical decision making (see chart for details).    MDM Rules/Calculators/A&P                         Consult: Cardiology Dr. Acie Fredrickson  Patient presents with rapid narrow complex tachycardia. He was treated by medics with Cardizem for new atrial fibrillation. Patient does describe a couple of episodes earlier in the week of perceiving his heart racing. He did not have any diagnostic testing done but had mentioned it to his doctor. He reports he was asymptomatic with that except for feeling the palpitations. No episodes of syncope or chest pain. Patient does significant amount of physical working out and does not get exertional chest pain or shortness of breath. Rates on arrival were from  130s to [place]. EKG reviewed with Dr. Katharina Caper. At this time most consistent with atrial fibrillation. Additional 25 mg dose of Cardizem and drip initiated. With heparin ordered. Per Dr. Acie Fredrickson,  Eliquis preferred and heparin not initiated. Patient remains free of chest pain or shortness of breath. Admitted for atrial fibrillation with rapid ventricular response new onset. Final Clinical Impression(s) / ED Diagnoses Final diagnoses:  Atrial fibrillation with RVR ([place])  New onset atrial fibrillation [facility])    Rx / DC Orders ED Discharge Orders    None       Charlesetta Shanks, MD 09/08/19 [phone]

## 2019-09-08 NOTE — H&P (Signed)
Cardiology Admission History and Physical:   Patient ID: [name] MRN: [id number]; DOB: November 27, 1960   Admission date: 09/08/2019  Primary Care Provider: Laurey Morale, MD [facility] Cardiologist: New to [place] Electrophysiologist:  None   Chief Complaint:   Rapid atrial fib .   Patient Profile:   [name] is a 59 y.o. male with hx oif back pain .  He is admitted with several weeks of cought,URI and progressive fatigue. Came to ER after his HR increased   History of Present Illness:   [name] is 59 yo with several weeks of URI,  Took OTC meds Treated himself He noticed a sudden increase in hr today around noon. Came to ER and was found to have Afib. No CP or dyspnea,  Just fatigue  Has not felt this previously  Hx neck surgery, bilat hip replacements  Hx of prostate cancer.  Does not sleep welll girlfriend says he gasps for air frequently at night   No weight gain or weight loss. Normally eats very well - very healthy No diarrhea.   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   Past Medical History:  Diagnosis Date  . Arthritis   . Cancer [facility])    prostate, sees Dr. Risa Grill   . Colon polyps   . Degenerative disc disease, cervical   . Degenerative disc disease, lumbar   . Depression   . GERD (gastroesophageal reflux disease)   . Low back pain   . Osteopenia     Past Surgical History:  Procedure Laterality Date  . CERVICAL FUSION  2015   C3 through C7, per Dr. Harl Bowie   . COLONOSCOPY  03/26/2018    per Dr. Alessandra Bevels, adenomatous polyps, repeat in 3 yrs   . CYSTECTOMY     benign, right hip  . GYNECOMASTIA EXCISION     bilateral, reduction  . JOINT REPLACEMENT  12-20-11   hx. LTHA, now RTHA planned  . LAMINECTOMY  2/09   lumbar  . LUMBAR FUSION  2015   L4 through S1, per Dr. Harl Bowie   . LUMBAR MICRODISCECTOMY  10/15/08   L-4-5, per Dr. Jenne Campus at [facility]  . PROSTATE SURGERY  12-13-10   robotic  prostatectomy per Dr. Risa Grill   . TOTAL HIP ARTHROPLASTY  12-20-11   2010 left hip  . TOTAL HIP ARTHROPLASTY  12/26/2011   Procedure: TOTAL HIP ARTHROPLASTY;  Surgeon: Gearlean Alf, MD;  Location: [facility];  Service: Orthopedics;  Laterality: Right;     Medications Prior to Admission: Prior to Admission medications   Medication Sig Start Date End Date Taking? Authorizing Provider  aspirin 81 MG tablet Take 81 mg by mouth daily.   Yes [provider]  cholecalciferol (VITAMIN D3) 25 MCG (1000 UNIT) tablet Take 5,000 Units by mouth daily.   Yes [provider]  diclofenac (VOLTAREN) 75 MG EC tablet Take 1 tablet (75 mg total) by mouth 2 (two) times daily. 04/24/19  Yes Laurey Morale, MD  Guaifenesin 1200 MG TB12 Take 600 mg by mouth daily.   Yes [provider]  omeprazole (PRILOSEC) 40 MG capsule Take 1 capsule (40 mg total) by mouth daily. 04/24/19  Yes Laurey Morale, MD  testosterone cypionate (DEPOTESTOSTERONE CYPIONATE) 200 MG/ML injection Inject 1 mL (200 mg total) into the muscle every 7 (seven) days. Patient taking differently: Inject 200 mg into the muscle every 7 (seven) days. Wednesdays. 04/17/19  Yes Laurey Morale, MD  traMADol (ULTRAM) 50 MG tablet Take 2 tablets (100 mg total) by mouth every 6 (six) hours as needed. for pain 08/06/19  Yes Laurey Morale, MD  amoxicillin (AMOXIL) 500 MG capsule Take 4 capsules prior to dental procedures Patient not taking: Reported on 09/08/2019 08/21/19   Laurey Morale, MD  hydrocortisone (ANUSOL-HC) 2.5 % rectal cream Place 1 application rectally 2 (two) times daily. Patient not taking: Reported on 09/08/2019 04/16/19   Laurey Morale, MD     Allergies:    Allergies  Allergen Reactions  . Flexeril [Cyclobenzaprine]     Weird feeling  . Oxycodone-Acetaminophen Nausea Only    dizziness  . Oxycodone-Acetaminophen Nausea Only and Other (See Comments)    dizziness Weird feeling;Hydrocodone on home med list  7/24 dizziness    Social History:   Social History   Socioeconomic History  . Marital status: Divorced    Spouse name: Not on file  . Number of children: Not on file  . Years of education: Not on file  . Highest education level: Not on file  Occupational History  . Not on file  Tobacco Use  . Smoking status: Never Smoker  . Smokeless tobacco: Never Used  Substance and Sexual Activity  . Alcohol use: No    Alcohol/week: 0.0 standard drinks    Comment: rare  . Drug use: No  . Sexual activity: Yes  Other Topics Concern  . Not on file  Social History Narrative   Lives alone in a one story home.  No children.     On disability since September 2016.  He was previously working in Engineer, technical sales.    Education: high school.   Social Determinants of Health   Financial Resource Strain:   . Difficulty of Paying Living Expenses:   Food Insecurity:   . Worried About Charity fundraiser in the Last Year:   . Arboriculturist in the Last Year:   Transportation Needs:   . Film/video editor (Medical):   Marland Kitchen Lack of Transportation (Non-Medical):   Physical Activity:   . Days of Exercise per Week:   . Minutes of Exercise per Session:   Stress:   . Feeling of Stress :   Social Connections:   . Frequency of Communication with Friends and Family:   . Frequency of Social Gatherings with Friends and Family:   . Attends Religious Services:   . Active Member of Clubs or Organizations:   . Attends Archivist Meetings:   Marland Kitchen Marital Status:   Intimate Partner Violence:   . Fear of Current or Ex-Partner:   . Emotionally Abused:   Marland Kitchen Physically Abused:   . Sexually Abused:     Family History:    The patient's family history includes Arthritis in an other family member; Cancer in his brother; Healthy in his mother; Hypertension in his father; Suicidality in his father.    ROS:  Please see the history of present illness.  All other ROS reviewed and negative.     Physical Exam/Data:    Vitals:   09/08/19 1636 09/08/19 1646 09/08/19 1656 09/08/19 1706  BP:      Pulse: (!) 103 (!) 151 (!) 120 (!) 133  Resp: 16 17 15 17   SpO2: 95% 99% 98% 98%   No intake or output data in the 24 hours ending 09/08/19 1803 Last 3 Weights 07/22/2019 05/07/2018 02/18/2018  Weight (lbs) 246 lb 12.8 oz 246 lb 4 oz 237 lb 8  oz  Weight (kg) 111.948 kg 111.698 kg 107.729 kg     There is no height or weight on file to calculate BMI.  General:  Well nourished, well developed, in no acute distress HEENT: normal Lymph: no adenopathy Neck: no JVD Endocrine:  No thryomegaly Vascular: No carotid bruits; FA pulses 2+ bilaterally without bruits  Cardiac:   Irreg. irreg. Lungs:  clear to auscultation bilaterally, no wheezing, rhonchi or rales  Abd: soft, nontender, no hepatomegaly  Ext: no edema Musculoskeletal:  No deformities, BUE and BLE strength normal and equal Skin: warm and dry  Neuro:  CNs 2-12 intact, no focal abnormalities noted Psych:  Normal affect    EKG:  Rapid Afib   Relevant CV Studies:   Laboratory Data:  High Sensitivity Troponin:   Recent Labs  Lab 09/08/19 1509  TROPONINIHS 11      Chemistry Recent Labs  Lab 09/08/19 1602  NA 140  K 3.8  CL 106  CO2 25  GLUCOSE 124*  BUN 7  CREATININE 0.98  CALCIUM 8.8*  GFRNONAA >60  GFRAA >60  ANIONGAP 9    Recent Labs  Lab 09/08/19 1602  PROT 7.1  ALBUMIN 3.7  AST 25  ALT 29  ALKPHOS 76  BILITOT 0.7   Hematology Recent Labs  Lab 09/08/19 1509  WBC 5.6  RBC 6.56*  HGB 13.7  HCT 45.6  MCV 69.5*  MCH 20.9*  MCHC 30.0  RDW 21.2*  PLT 290   BNPNo results for input(s): BNP, PROBNP in the last 168 hours.  DDimer No results for input(s): DDIMER in the last 168 hours.   Radiology/Studies:  DG Chest Port 1 View  Result Date: 09/08/2019 CLINICAL DATA:  59 year old male with tachycardia. EXAM: PORTABLE CHEST 1 VIEW COMPARISON:  Chest radiograph dated 11/16/2013. FINDINGS: There is mild elevation of the  left hemidiaphragm. No focal consolidation, pleural effusion, or pneumothorax. There is mild chronic interstitial coarsening and bronchitic changes. There is mild cardiomegaly, with increased cardiopericardial silhouette since the prior radiograph. No acute osseous pathology. Lower cervical ACDF and partially visualized lower thoracic spinal fusion. IMPRESSION: 1. No acute cardiopulmonary process. 2. Mild cardiomegaly, new since the prior radiograph. Electronically Signed   By: Anner Crete M.D.   On: 09/08/2019 15:34       Assessment and Plan:   1. Rapid atrial fib:  Newly diagnosed Afib.  Duration unclear.   May have just started today but he has been having pain for several weeks.  He has been taking lots of over-the-counter cold medications some of which may have had Sudafed.  He is currently on Cardizem drip.  We will continue Cardizem drip.  We will start him on Eliquis tonight.  If he remains in atrial fibrillation we will consider TEE cardioversion after 3 doses of Eliquis.  Hopefully he will convert to sinus rhythm on Cardizem later this evening or tomorrow.  2.  ? 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  His girlfriend states that he does gasp for air occasionally at night.  He probably has sleep apnea.  We will need to arrange for sleep study as an outpatient.  Echo  TSH   Severity of Illness: The appropriate patient status for this patient is OBSERVATION. Observation status is judged to be reasonable and necessary in order to provide the required intensity of service to ensure the patient's safety. The patient's presenting symptoms, physical exam findings, and initial radiographic and laboratory data in the context  of their medical condition is felt to place them at decreased risk for further clinical deterioration. Furthermore, it is anticipated that the patient will be medically stable for discharge from the hospital within 2 midnights of admission. The following  factors support the patient status of observation.   " The patient's presenting symptoms include atrial fib . " The physical exam findings include  Afib with RVR . " The initial radiographic and laboratory data are .     For questions or updates, please contact [place] Please consult [email or web address] for contact info under     Signed, Mertie Moores, MD  09/08/2019 6:03 PM

## 2019-09-08 NOTE — Progress Notes (Signed)
ANTICOAGULATION CONSULT NOTE - Initial Consult  Pharmacy Consult for Heparin Indication: atrial fibrillation  Allergies  Allergen Reactions  . Flexeril [Cyclobenzaprine]     Weird feeling  . Oxycodone-Acetaminophen Nausea Only    dizziness  . Oxycodone-Acetaminophen Nausea Only and Other (See Comments)    dizziness Weird feeling;Hydrocodone on home med list 7/24 dizziness    Patient Measurements:    Weight: 111kg (patient reported) Heparin Dosing Weight: 99kg   Vital Signs: BP: 127/83 (07/06 1459) Pulse Rate: 156 (07/06 1459)  Labs: Recent Labs    09/08/19 1509  HGB 13.7  HCT 45.6  PLT 290  LABPROT 13.4  INR 1.1    CrCl cannot be calculated (Patient's most recent lab result is older than the maximum 21 days allowed.).   Medical History: Past Medical History:  Diagnosis Date  . Arthritis   . Cancer [facility])    prostate, sees Dr. Risa Grill   . Colon polyps   . Degenerative disc disease, cervical   . Degenerative disc disease, lumbar   . Depression   . GERD (gastroesophageal reflux disease)   . Low back pain   . Osteopenia     Assessment: Patient is a 59yo male who presents with atrial fibrillation. Pharmacy has been asked to dose heparin. H&H & PLT are WNL.  Goal of Therapy:  Heparin level 0.3-0.7 units/ml Monitor platelets by anticoagulation protocol: Yes   Plan:  Give heparin 4000 unit IV bolus x1 Then give heparin 1500 units/hr IV Check 6 hour heparin level Monitor daily heparin level & CBC  Beckey Rutter 09/08/2019,3:59 PM

## 2019-09-09 ENCOUNTER — Other Ambulatory Visit: Payer: Self-pay | Admitting: Cardiology

## 2019-09-09 ENCOUNTER — Telehealth: Payer: Self-pay

## 2019-09-09 ENCOUNTER — Telehealth: Payer: Self-pay | Admitting: *Deleted

## 2019-09-09 ENCOUNTER — Telehealth: Payer: Self-pay | Admitting: Cardiovascular Disease

## 2019-09-09 DIAGNOSIS — I4891 Unspecified atrial fibrillation: Secondary | ICD-10-CM | POA: Diagnosis not present

## 2019-09-09 DIAGNOSIS — R0683 Snoring: Secondary | ICD-10-CM

## 2019-09-09 LAB — CBC
HCT: 40.1 % (ref 39.0–52.0)
Hemoglobin: 12 g/dL — ABNORMAL LOW (ref 13.0–17.0)
MCH: 21 pg — ABNORMAL LOW (ref 26.0–34.0)
MCHC: 29.9 g/dL — ABNORMAL LOW (ref 30.0–36.0)
MCV: 70.2 fL — ABNORMAL LOW (ref 80.0–100.0)
Platelets: 329 10*3/uL (ref 150–400)
RBC: 5.71 MIL/uL (ref 4.22–5.81)
RDW: 20.7 % — ABNORMAL HIGH (ref 11.5–15.5)
WBC: 7.8 10*3/uL (ref 4.0–10.5)
nRBC: 0 % (ref 0.0–0.2)

## 2019-09-09 LAB — BASIC METABOLIC PANEL
Anion gap: 10 (ref 5–15)
BUN: 8 mg/dL (ref 6–20)
CO2: 23 mmol/L (ref 22–32)
Calcium: 8.5 mg/dL — ABNORMAL LOW (ref 8.9–10.3)
Chloride: 103 mmol/L (ref 98–111)
Creatinine, Ser: 0.95 mg/dL (ref 0.61–1.24)
GFR calc Af Amer: >60 mL/min (ref >60)
GFR calc non Af Amer: >60 mL/min (ref >60)
Glucose, Bld: 131 mg/dL — ABNORMAL HIGH (ref 70–99)
Potassium: 3.5 mmol/L (ref 3.5–5.1)
Sodium: 136 mmol/L (ref 135–145)

## 2019-09-09 LAB — LIPID PANEL
Cholesterol: 201 mg/dL — ABNORMAL HIGH (ref 0–200)
HDL: 27 mg/dL — ABNORMAL LOW (ref >40)
LDL Cholesterol: 153 mg/dL — ABNORMAL HIGH (ref 0–99)
Total CHOL/HDL Ratio: 7.4 RATIO
Triglycerides: 106 mg/dL (ref <150)
VLDL: 21 mg/dL (ref 0–40)

## 2019-09-09 LAB — TSH: TSH: 2.014 u[IU]/mL (ref 0.350–4.500)

## 2019-09-09 LAB — HIV ANTIBODY (ROUTINE TESTING W REFLEX): HIV Screen 4th Generation wRfx: NONREACTIVE

## 2019-09-09 MED ORDER — DILTIAZEM HCL ER COATED BEADS 180 MG PO CP24: 180.0000 mg | ORAL_CAPSULE | Freq: Every day | ORAL | 2 refills | Status: DC

## 2019-09-09 MED ORDER — DILTIAZEM HCL ER COATED BEADS 180 MG PO CP24
180.0000 mg | ORAL_CAPSULE | Freq: Every day | ORAL | Status: DC
  Filled 2019-09-09: qty 1

## 2019-09-09 NOTE — ED Notes (Signed)
Breakfast ordered 

## 2019-09-09 NOTE — ED Notes (Signed)
This RN sent page requesting [facility] cardiology team contact this RN regarding rhythm change. Awaiting cardiology response.

## 2019-09-09 NOTE — Discharge Summary (Addendum)
Discharge Summary    Patient ID: [name] MRN: [id number]; DOB: 06-20-1960  Admit date: 09/08/2019 Discharge date: 09/09/2019  Primary Care Provider: Laurey Morale, MD  Primary Cardiologist: Dr. Acie Fredrickson, MD    Discharge Diagnoses    Active Problems:   Atrial fibrillation with [facility])  Diagnostic Studies/Procedures    None>>needs OP echocardiogram (order placed). Also needs sleep study (messgae sent).  _____________   History of Present Illness     [name] is a 59 y.o. male with a hx of back and and new onset AF.   [name] has no significant past medical history. He recently developed an upper respiratory tract infection and was taking lots of cold medications which he thinks also had Sudafed. He subsequently developed palpitations and presented to the ER 09/08/19 and was found to have new onset atrial fibrillation.  Hospital Course    He was started on Cardizem drip. His rate became somewhat better controlled. He converted back to sinus rhythm 09/09/19 approximately 12am.   His CHADS2VASC score is 0  New onset atrial fibrillation with rapid ventricular response:  -Patient presented with rapid atrial fibrillation which occurred in the setting of a respiratory infection and taking over-the-counter cold medications.  He also quite likely has obstructive sleep apnea. -Converted back to sinus rhythm on a Cardizem drip 09/09/19. -CHA2DS2-VASc score is 0.   -TSH is normal.   -Echocardiogram has been ordered but has not been done yet>>anticipate getting that as an outpatient.  -He will be discharged from the ED 09/09/19  -Plan for outpatient sleep study.   -He will follow up with [place] in the office in approximately 3 to 4 weeks.  -He will follow-up with his primary medical doctor.  Outpatient studies to be ordered: 1.  Echocardiogram 2.  Sleep study  Consultants: None   The patient was seen and examined by [name] who feels that he is stable and ready for  discharge today, 09/09/19.   Did the patient have an acute coronary syndrome (MI, NSTEMI, STEMI, etc) this admission?:  No                               Did the patient have a percutaneous coronary intervention (stent / angioplasty)?:  No.   _____________  Discharge Vitals Blood pressure 118/76, pulse 69, temperature 98 F (36.7 C), temperature source Oral, resp. rate 17, SpO2 97 %.  There were no vitals filed for this visit.  Labs & Radiologic Studies    CBC Recent Labs    09/08/19 1509 09/09/19 0450  WBC 5.6 7.8  NEUTROABS 3.6  --   HGB 13.7 12.0*  HCT 45.6 40.1  MCV 69.5* 70.2*  PLT 290 712   Basic Metabolic Panel Recent Labs    09/08/19 1602 09/09/19 0450  NA 140 136  K 3.8 3.5  CL 106 103  CO2 25 23  GLUCOSE 124* 131*  BUN 7 8  CREATININE 0.98 0.95  CALCIUM 8.8* 8.5*  MG 2.0  --   PHOS 1.9*  --    Liver Function Tests Recent Labs    09/08/19 1602  AST 25  ALT 29  ALKPHOS 76  BILITOT 0.7  PROT 7.1  ALBUMIN 3.7   No results for input(s): LIPASE, AMYLASE in the last 72 hours. High Sensitivity Troponin:   Recent Labs  Lab 09/08/19 1509 09/08/19 1705  TROPONINIHS 11 14    BNP  Invalid input(s): POCBNP D-Dimer No results for input(s): DDIMER in the last 72 hours. Hemoglobin A1C No results for input(s): HGBA1C in the last 72 hours. Fasting Lipid Panel Recent Labs    09/09/19 0450  CHOL 201*  HDL 27*  LDLCALC 153*  TRIG 106  CHOLHDL 7.4   Thyroid Function Tests Recent Labs    09/09/19 0450  TSH 2.014   _____________  DG Chest Port 1 View  Result Date: 09/08/2019 CLINICAL DATA:  59 year old male with tachycardia. EXAM: PORTABLE CHEST 1 VIEW COMPARISON:  Chest radiograph dated 11/16/2013. FINDINGS: There is mild elevation of the left hemidiaphragm. No focal consolidation, pleural effusion, or pneumothorax. There is mild chronic interstitial coarsening and bronchitic changes. There is mild cardiomegaly, with increased cardiopericardial  silhouette since the prior radiograph. No acute osseous pathology. Lower cervical ACDF and partially visualized lower thoracic spinal fusion. IMPRESSION: 1. No acute cardiopulmonary process. 2. Mild cardiomegaly, new since the prior radiograph. Electronically Signed   By: Anner Crete M.D.   On: 09/08/2019 15:34   Disposition   Pt is being discharged home today in good condition.  Follow-up Plans & Appointments     Follow-up Information    William Junes, NP Follow up on 09/22/2019.   Specialties: Nurse Practitioner, Interventional Cardiology, Cardiology, Radiology Why: at 11:15am Contact information: [place]. [address] [place] [address] [phone]              Discharge Instructions    Call MD for:  difficulty breathing, headache or visual disturbances   Complete by: As directed    Call MD for:  difficulty breathing, headache or visual disturbances   Complete by: As directed    Call MD for:  extreme fatigue   Complete by: As directed    Call MD for:  extreme fatigue   Complete by: As directed    Call MD for:  hives   Complete by: As directed    Call MD for:  hives   Complete by: As directed    Call MD for:  persistant dizziness or light-headedness   Complete by: As directed    Call MD for:  persistant dizziness or light-headedness   Complete by: As directed    Call MD for:  persistant nausea and vomiting   Complete by: As directed    Call MD for:  persistant nausea and vomiting   Complete by: As directed    Call MD for:  redness, tenderness, or signs of infection (pain, swelling, redness, odor or green/yellow discharge around incision site)   Complete by: As directed    Call MD for:  redness, tenderness, or signs of infection (pain, swelling, redness, odor or green/yellow discharge around incision site)   Complete by: As directed    Call MD for:  severe uncontrolled pain   Complete by: As directed    Call MD for:  severe uncontrolled pain    Complete by: As directed    Call MD for:  temperature >100.4   Complete by: As directed    Call MD for:  temperature >100.4   Complete by: As directed    Increase activity slowly   Complete by: As directed    Increase activity slowly   Complete by: As directed      Discharge Medications   Allergies as of 09/09/2019      Reactions   Flexeril [cyclobenzaprine]    Weird feeling   Oxycodone-acetaminophen Nausea Only   dizziness   Oxycodone-acetaminophen  Nausea Only, Other (See Comments)   dizziness Weird feeling;Hydrocodone on home med list 7/24 dizziness      Medication List    TAKE these medications   aspirin 81 MG tablet Take 81 mg by mouth daily.   cholecalciferol 25 MCG (1000 UNIT) tablet Commonly known as: VITAMIN D3 Take 5,000 Units by mouth daily.   diclofenac 75 MG EC tablet Commonly known as: VOLTAREN Take 1 tablet (75 mg total) by mouth 2 (two) times daily.   diltiazem 180 MG 24 hr capsule Commonly known as: CARDIZEM CD Take 1 capsule (180 mg total) by mouth daily.   Guaifenesin 1200 MG Tb12 Take 600 mg by mouth daily.   omeprazole 40 MG capsule Commonly known as: PRILOSEC Take 1 capsule (40 mg total) by mouth daily.   testosterone cypionate 200 MG/ML injection Commonly known as: DEPOTESTOSTERONE CYPIONATE Inject 1 mL (200 mg total) into the muscle every 7 (seven) days. What changed: additional instructions   traMADol 50 MG tablet Commonly known as: ULTRAM Take 2 tablets (100 mg total) by mouth every 6 (six) hours as needed. for pain        Outstanding Labs/Studies   Sleep Study  Echocardiogram   Duration of Discharge Encounter   Greater than 30 minutes including physician time.  Signed, Kathyrn Drown, NP 09/09/2019, 8:15 AM  Attending Note:   The patient was seen and examined.  Agree with assessment and plan as noted above.  Changes made to the above note as needed.  Patient seen and independently examined with Kathyrn Drown, NP .    We discussed all aspects of the encounter. I agree with the assessment and plan as stated above.  1.  New onset atrial fibrillation with rapid ventricular response: Patient presented with rapid atrial fibrillation.  This occurred in the setting of a respiratory infection and taking over-the-counter cold medications.  He also quite likely has obstructive sleep apnea.  He converted back to sinus rhythm on a Cardizem drip.  He is feeling quite a bit better this morning. His CHA2DS2-VASc score is 0.  TSH is normal.  Echocardiogram has been ordered but has not been done yet.  We will anticipate getting that as an outpatient.  We will discharge him from the emergency room this morning.   We will arrange for an outpatient sleep study.  He will follow up with [place] in the office in approximately 3 to 4 weeks.  He will follow-up with his primary medical doctor.  Outpatient studies to be ordered: 1.  Echocardiogram 2.  Sleep study   I have spent a total of 40 minutes with patient reviewing hospital  notes , telemetry, EKGs, labs and examining patient as well as establishing an assessment and plan that was discussed with the patient. > 50% of time was spent in direct patient care.    Thayer Headings, Brooke Bonito., MD, [facility] 09/10/2019, 3:17 PM [address]  [place] Pager [phone]

## 2019-09-09 NOTE — ED Notes (Signed)
Cardizem stopped at this time, sinus brady noted on monitor, pt made aware of medication being stopped at this time with verbalized understanding. Awaiting cardiology response at this time.

## 2019-09-09 NOTE — Telephone Encounter (Signed)
-----   Message from Tommie Raymond, NP sent at 09/09/2019  7:52 AM EDT ----- Regarding: Sleep study scheduled Can we please arrange for an OP sleep study per Dr. Acie Fredrickson for this patient. He will be discharged from the ED today. Please call to inform him of the date and time of his appointment   Thank you  Sharee Pimple

## 2019-09-09 NOTE — ED Notes (Signed)
Pt co of extreme pain at right IV site, site assessed mild redness noted around Tegaderm site, IV patent. Pt requesting IV removal.

## 2019-09-09 NOTE — Telephone Encounter (Signed)
PA submitted to [facility] for sleep study via web portal.

## 2019-09-09 NOTE — ED Notes (Signed)
Pt sitting up on side of the bed, pt stable requesting meal tray. Pt provided meal tray, pt denies needs, wants or concerns at this time.

## 2019-09-09 NOTE — ED Notes (Signed)
Paged cardiology to RN chandra

## 2019-09-09 NOTE — Progress Notes (Addendum)
Progress Note  Patient Name: [name] Date of Encounter: 09/09/2019  [facility] Cardiologist:   Subjective   59 year old gentleman with no significant past medical history.  He recently developed an upper respiratory tract infection and was taking lots of cold medications which he thinks also had Sudafed.  He developed palpitations and presented to the ER yesterday and was found to have atrial fibrillation.  He was started on Cardizem drip.  His rate became somewhat better controlled.  He converted back to sinus rhythm this morning around midnight.  His CGHADS2VASC score is 0. He is stable for discharge.   Inpatient Medications    Scheduled Meds: . apixaban  5 mg Oral BID  . diclofenac  75 mg Oral BID  . pantoprazole  40 mg Oral Daily   Continuous Infusions: . sodium chloride 1,000 mL (09/08/19 1539)  . diltiazem (CARDIZEM) infusion Stopped (09/09/19 0044)   PRN Meds: acetaminophen, ondansetron (ZOFRAN) IV, traMADol   Vital Signs    Vitals:   09/09/19 0300 09/09/19 0330 09/09/19 0400 09/09/19 0415  BP: 136/79 (!) 154/92 128/85 118/76  Pulse: (!) 59 68 65 69  Resp: 18 17 17 17   Temp:      TempSrc:      SpO2: 98% 96% 96% 97%   No intake or output data in the 24 hours ending 09/09/19 0734 Last 3 Weights 07/22/2019 05/07/2018 02/18/2018  Weight (lbs) 246 lb 12.8 oz 246 lb 4 oz 237 lb 8 oz  Weight (kg) 111.948 kg 111.698 kg 107.729 kg      Telemetry     NSR - Personally Reviewed  ECG     nsr  - Personally Reviewed  Physical Exam   GEN: No acute distress.   Neck: No JVD Cardiac: RRR, no murmurs, rubs, or gallops.  Respiratory: Clear to auscultation bilaterally. GI: Soft, nontender, non-distended  MS: No edema; No deformity. Neuro:  Nonfocal  Psych: Normal affect   Labs    High Sensitivity Troponin:   Recent Labs  Lab 09/08/19 1509 09/08/19 1705  TROPONINIHS 11 14      Chemistry Recent Labs  Lab 09/08/19 1602 09/09/19 0450  NA 140  136  K 3.8 3.5  CL 106 103  CO2 25 23  GLUCOSE 124* 131*  BUN 7 8  CREATININE 0.98 0.95  CALCIUM 8.8* 8.5*  PROT 7.1  --   ALBUMIN 3.7  --   AST 25  --   ALT 29  --   ALKPHOS 76  --   BILITOT 0.7  --   GFRNONAA >60 >60  GFRAA >60 >60  ANIONGAP 9 10     Hematology Recent Labs  Lab 09/08/19 1509 09/09/19 0450  WBC 5.6 7.8  RBC 6.56* 5.71  HGB 13.7 12.0*  HCT 45.6 40.1  MCV 69.5* 70.2*  MCH 20.9* 21.0*  MCHC 30.0 29.9*  RDW 21.2* 20.7*  PLT 290 329    BNPNo results for input(s): BNP, PROBNP in the last 168 hours.   DDimer No results for input(s): DDIMER in the last 168 hours.   Radiology    DG Chest Port 1 View  Result Date: 09/08/2019 CLINICAL DATA:  59 year old male with tachycardia. EXAM: PORTABLE CHEST 1 VIEW COMPARISON:  Chest radiograph dated 11/16/2013. FINDINGS: There is mild elevation of the left hemidiaphragm. No focal consolidation, pleural effusion, or pneumothorax. There is mild chronic interstitial coarsening and bronchitic changes. There is mild cardiomegaly, with increased cardiopericardial silhouette since the prior radiograph. No acute  osseous pathology. Lower cervical ACDF and partially visualized lower thoracic spinal fusion. IMPRESSION: 1. No acute cardiopulmonary process. 2. Mild cardiomegaly, new since the prior radiograph. Electronically Signed   By: Anner Crete M.D.   On: 09/08/2019 15:34    Cardiac Studies      Patient Profile     59 y.o. male newly diagnosed atrial fibrillation in the setting of over-the-counter cold medications.  He also likely has sleep apnea.  Assessment & Plan    1.  New onset atrial fibrillation with rapid ventricular response: Patient presented with rapid atrial fibrillation.  This occurred in the setting of a respiratory infection and taking over-the-counter cold medications.  He also quite likely has obstructive sleep apnea.  He converted back to sinus rhythm on a Cardizem drip.  He is feeling quite a bit  better this morning. His CHA2DS2-VASc score is 0.  TSH is normal.  Echocardiogram has been ordered but has not been done yet.  We will anticipate getting that as an outpatient.  We will discharge him from the emergency room this morning.   We will arrange for an outpatient sleep study.  He will follow up with [place] in the office in approximately 3 to 4 weeks.  He will follow-up with his primary medical doctor.  Outpatient studies to be ordered: 1.  Echocardiogram 2.  Sleep study  For questions or updates, please contact [place] Please consult [email or web address] for contact info under        Signed, Mertie Moores, MD  09/09/2019, 7:34 AM

## 2019-09-09 NOTE — Telephone Encounter (Signed)
-----   Message from Nuala Alpha, LPN sent at 03/11/4351  8:55 AM EDT ----- Regarding: pt needs to be scheduled for sleep study per Dr. Acie Fredrickson and Kathyrn Drown NP Per Dr. Acie Fredrickson and Kathyrn Drown NP, they discharged this pt from the hospital for afib, suspected OSA, snoring.  They ordered for the pt to have a sleep study done.  Order placed and they wanted triage to send you a note to go ahead and work on getting this scheduled.  We also sent you a telephone call with more information about this.  Can you please schedule this pts sleep study and let triage know when its complete and scheduled, for triage follows Nahser's pts now.   Thanks for all you do, Ivy in Triage

## 2019-09-09 NOTE — Telephone Encounter (Signed)
Below is supporting documentation and discharge note from Kathyrn Drown NP when seeing the pt in the ER. Per Sharee Pimple and Dr. Acie Fredrickson, pt needs to have an outpatient sleep study ordered.  Order for sleep study will be placed and will send a message to our sleep study pool to help facilitate in getting this scheduled.    He was started on Cardizem drip. His rate became somewhat better controlled. He converted back to sinus rhythm 09/09/19 approximately 12am.   HisCHADS2VASCscore is 0  New onset atrial fibrillation with rapid ventricular response:  -Patient presented with rapid atrial fibrillation which occurred in the setting of a respiratory infection and taking over-the-counter cold medications. He also quite likely has obstructive sleep apnea. -Converted back to sinus rhythm on a Cardizem drip 09/09/19. -CHA2DS2-VASc score is 0.  -TSH is normal.  -Echocardiogram has been ordered but has not been done yet>>anticipate getting that as an outpatient.  -He will be discharged from the ED 09/09/19  -Plan for outpatient sleep study.  -He will follow up with [place] in the office in approximately 3 to 4 weeks.  -He will follow-up with his primary medical doctor.  Outpatient studies to be ordered: 1. Echocardiogram 2. Sleep study

## 2019-09-09 NOTE — Telephone Encounter (Signed)
Okay for refill?   LOV 07/22/2019 for CPE  04/17/2019 QTY.  12 Ml R 1

## 2019-09-09 NOTE — ED Notes (Signed)
Pt self converted from Afib HR 74 on cardiac monitor to SR 74 on cardiac monitor, 12 lead ran at this time, denies chest pain at present. Pt reports standing up when the conversion occurred. Pt denies lightheadedness or dizziness with standing.

## 2019-09-09 NOTE — Telephone Encounter (Signed)
Per Sharee Pimple, set up [facility] Visit on 09/22/19 at 11:15 am with Truitt Merle.

## 2019-09-09 NOTE — ED Notes (Signed)
Spoke with Cardiology regarding pt request, reporting provider from team will round within 30 minutes, pt made aware.

## 2019-09-09 NOTE — Telephone Encounter (Signed)
Sent to pre-cert for in lab split night study. Awaiting approval or recommendations.

## 2019-09-09 NOTE — ED Notes (Signed)
Pt requesting to speak with Cardiology regarding plan, charge nurse made aware, cardiology paged at this time.

## 2019-09-10 NOTE — Telephone Encounter (Signed)
**Note De-Identified  Obfuscation** Patient contacted regarding discharge from [place] [place] ED on 09/09/2019.  Patient understands to follow up with provider Truitt Merle, NP on 09/22/2019 at 11:15 at [address] in [place], [place] [address]. Patient understands discharge instructions? Yes Patient understands medications and regiment? Yes Patient understands to bring all medications to this visit? Yes  Ask patient:  Are you enrolled in My Chart: The pt states that he has signed up and uses [place] often.

## 2019-09-10 NOTE — Telephone Encounter (Signed)
**Note De-Identified  Obfuscation** 1st TCM Call Attempt: No answer so I left a message on the pts VM asking him to call Jeani Hawking at [facility] at [phone].

## 2019-09-14 ENCOUNTER — Telehealth: Payer: Self-pay | Admitting: Cardiovascular Disease

## 2019-09-14 MED ORDER — DILTIAZEM HCL ER COATED BEADS 120 MG PO CP24
120.0000 mg | ORAL_CAPSULE | Freq: Every day | ORAL | 11 refills | Status: DC
Start: 2019-09-14 — End: 2019-09-22

## 2019-09-14 NOTE — Telephone Encounter (Signed)
Returned call to patient who states he is very fatigued since starting diltiazem 180 mg on Friday. Reports sleeping 14 hrs per night, usually works out 5 days per week at the gym but does not feel like he has the energy to work out today.  BP last night was 121/64, HR 75-82 bpm. Reports mild dizziness associated with this BP. Previous BP readings are usually >130/80.  I reviewed the reason for the diltiazem and that per Dr. Acie Fredrickson he can try a lower dose. He would like to try diltiazem 120 mg and may try to take it in the evening to see if this helps with the fatigue. He has a follow-up appointment with Truitt Merle, NP next week and is aware he can call back prior to that time with additional questions. He thanked me for the call.

## 2019-09-14 NOTE — Telephone Encounter (Signed)
Pt c/o medication issue:  1. Name of Medication: diltiazem (CARDIZEM CD) 180 MG 24 hr capsule  2. How are you currently taking this medication (dosage and times per day)? 1 tablet daily  3. Are you having a reaction (difficulty breathing--STAT)? no  4. What is your medication issue? Patient states since starting the medication he has been having dizziness, fatigue, and headaches. He states his BP has also been low. He states normally his BP is around 130/82, but it has been 117/65. He states he has not taken the medication today and feels good. He would like to know if he can stop the medication.

## 2019-09-15 ENCOUNTER — Telehealth: Payer: Self-pay | Admitting: *Deleted

## 2019-09-15 NOTE — Progress Notes (Signed)
CARDIOLOGY OFFICE NOTE  Date:  09/22/2019    Meta Hatchet Date of Birth: January 01, 1961 Medical Record #[id number]  PCP:  Laurey Morale, MD  Cardiologist:  Nahser (NEW)    Chief Complaint  Patient presents with  . Follow-up    History of Present Illness: [name] is a 59 y.o. male who presents today for a post hospital visit. Seen for Dr. Acie Fredrickson (NEW).   No prior cardiac issues.   Presented to the hospital earlier this month - had heart racing in the setting of URI. EMS noted heart rates in the 180s and 170s on arrival.  He was given 20 mg of Cardizem and a subsequent 10 mg dose with rate slowing to the 130s-150s. Found to have AF. Seen by Dr. Acie Fredrickson.   Family history: Negative for early cardiac disease or sudden death.  Patient's father took medications for a heart problem but died in his 87s due to suicide.  Patient has 2 brothers, 1 died from suicide the other from cancer.  Patient has a sister in her 80s without significant medical problems.  No one with sudden cardiac death to the patient's knowledge.   He was started on Cardizem drip. His rate became somewhat better controlled. He converted back to sinus rhythm - CHADS2VASCscore is 0. Probably with some degree of OSA. To have outpatient echo with plans for sleep study. TSH was normal.   Comes in today. Here alone. Does not feel well. He is quite fatigued. His dose of CCB has already been cut back. Sleep study is pending. No real palpitations. Had been using cold medicine for over 2 weeks and using probably sudafed and had not been sleeping. His sleep is poor - this is chronic. He has tried to stop caffeine. Does have a headache - this may be related to stopping caffeine. He really liked diet Mtn. Dew. 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 Echo is pending. BP diary from home looks good  for the most part. He does not have a formal diagnosis of HTN. He does give blood routinely - about every 8 to 10 weeks since he is on testosterone - MCV is noted to be 70 - he does take an iron supplement.   Past Medical History:  Diagnosis Date  . Arthritis   . Cancer [facility])    prostate, sees Dr. Risa Grill   . Colon polyps   . Degenerative disc disease, cervical   . Degenerative disc disease, lumbar   . Depression   . GERD (gastroesophageal reflux disease)   . Low back pain   . Osteopenia     Past Surgical History:  Procedure Laterality Date  . CERVICAL FUSION  2015   C3 through C7, per Dr. Harl Bowie   . COLONOSCOPY  03/26/2018    per Dr. Alessandra Bevels, adenomatous polyps, repeat in 3 yrs   . CYSTECTOMY     benign, right hip  . GYNECOMASTIA EXCISION     bilateral, reduction  . JOINT REPLACEMENT  12-20-11   hx. LTHA, now RTHA planned  . LAMINECTOMY  2/09   lumbar  . LUMBAR FUSION  2015   L4 through S1, per Dr. Harl Bowie   . LUMBAR MICRODISCECTOMY  10/15/08   L-4-5, per Dr. Jenne Campus at [facility]  . PROSTATE SURGERY  12-13-10  robotic prostatectomy per Dr. Risa Grill   . TOTAL HIP ARTHROPLASTY  12-20-11   2010 left hip  . TOTAL HIP ARTHROPLASTY  12/26/2011   Procedure: TOTAL HIP ARTHROPLASTY;  Surgeon: Gearlean Alf, MD;  Location: [facility] ORS;  Service: Orthopedics;  Laterality: Right;     Medications: Current Meds  Medication Sig  . aspirin 81 MG tablet Take 81 mg by mouth daily.  . cholecalciferol (VITAMIN D3) 25 MCG (1000 UNIT) tablet Take 5,000 Units by mouth daily.  . diclofenac (VOLTAREN) 75 MG EC tablet Take 1 tablet (75 mg total) by mouth 2 (two) times daily.  . Guaifenesin 1200 MG TB12 Take 600 mg by mouth daily.  Marland Kitchen omeprazole (PRILOSEC) 40 MG capsule Take 1 capsule (40 mg total) by mouth daily.  Marland Kitchen testosterone cypionate (DEPOTESTOSTERONE CYPIONATE) 200 MG/ML injection INJECT 1 ML (200 MG TOTAL) INTO THE MUSCLE EVERY 7 (SEVEN) DAYS.  Marland Kitchen traMADol (ULTRAM) 50 MG  tablet Take 2 tablets (100 mg total) by mouth every 6 (six) hours as needed. for pain  . [DISCONTINUED] diltiazem (CARDIZEM CD) 120 MG 24 hr capsule Take 1 capsule (120 mg total) by mouth daily.     Allergies: Allergies  Allergen Reactions  . Flexeril [Cyclobenzaprine]     Weird feeling  . Oxycodone-Acetaminophen Nausea Only    dizziness  . Oxycodone-Acetaminophen Nausea Only and Other (See Comments)    dizziness Weird feeling;Hydrocodone on home med list 7/24 dizziness    Social History: The patient  reports that he has never smoked. He has never used smokeless tobacco. He reports that he does not drink alcohol and does not use drugs.   Family History: The patient's family history includes Arthritis in an other family member; Cancer in his brother; Healthy in his mother; Hypertension in his father; Suicidality in his father.   Review of Systems: Please see the history of present illness.   All other systems are reviewed and negative.   Physical Exam: VS:  BP 126/80   Pulse 86   Ht 5\' 10"  (1.778 m)   Wt 246 lb 12.8 oz (111.9 kg)   SpO2 97%   BMI 35.41 kg/m  .  BMI Body mass index is 35.41 kg/m.  Wt Readings from Last 3 Encounters:  09/22/19 246 lb 12.8 oz (111.9 kg)  07/22/19 246 lb 12.8 oz (111.9 kg)  05/07/18 246 lb 4 oz (111.7 kg)    General: Pleasant. Alert and in no acute distress.   Cardiac: Regular rate and rhythm. No murmurs, rubs, or gallops. No edema.  Respiratory:  Lungs are clear to auscultation bilaterally with normal work of breathing.  GI: Soft and nontender.  MS: No deformity or atrophy. Gait and ROM intact.  Skin: Warm and dry. Color is normal.  Neuro:  Strength and sensation are intact and no gross focal deficits noted.  Psych: Alert, appropriate and with normal affect.   LABORATORY DATA:  EKG:  EKG is ordered today.  Personally reviewed by me. This demonstrates NSR. HR is 86  Lab Results  Component Value Date   WBC 7.8 09/09/2019   HGB  12.0 (L) 09/09/2019   HCT 40.1 09/09/2019   PLT 329 09/09/2019   GLUCOSE 131 (H) 09/09/2019   CHOL 201 (H) 09/09/2019   TRIG 106 09/09/2019   HDL 27 (L) 09/09/2019   LDLDIRECT 138.8 08/17/2009   LDLCALC 153 (H) 09/09/2019   ALT 29 09/08/2019   AST 25 09/08/2019   NA 136 09/09/2019   K 3.5 09/09/2019  CL 103 09/09/2019   CREATININE 0.95 09/09/2019   BUN 8 09/09/2019   CO2 23 09/09/2019   TSH 2.014 09/09/2019   PSA 0.00 (L) 07/22/2019   INR 1.1 09/08/2019   HGBA1C 5.3 11/29/2017     BNP (last 3 results) No results for input(s): BNP in the last 8760 hours.  ProBNP (last 3 results) No results for input(s): PROBNP in the last 8760 hours.   Other Studies Reviewed Today:   Assessment/Plan:  1. New onset AF - most likely due to cold medicines - maintaining NSR - not tolerating CCB therapy - will stop this today - will give him short acting Diltiazem 30 mg to use prn. EKG is ok today. Echo and sleep study pending. Reminded about staying away from decongestants. CHADSVASC is 0.   2. Probable OSA - for sleep study - he does endorse fatigue/snoring and not feeling rested when he wakes up.   3. Obesity - encouraged more aerobic activity.   4. Prior steroid therapy  5. Prior prostate cancer - he is on testosterone therapy per PCP. MCV on his lab was 70 - he is on iron supplement.   Current medicines are reviewed with the patient today.  The patient does not have concerns regarding medicines other than what has been noted above.  The following changes have been made:  See above.  Labs/ tests ordered today include:    Orders Placed This Encounter  Procedures  . EKG 12-Lead     Disposition:   FU with Dr. Acie Fredrickson in 3 to 4 months.    Patient is agreeable to this plan and will call if any problems develop in the interim.   SignedTruitt Merle, NP  09/22/2019 11:39 AM  [facility] [address] [place], [place]   [address] Phone: [phone] Fax: [phone]

## 2019-09-15 NOTE — Telephone Encounter (Signed)
Staff message sent to Gae Bon ok to schedule sleep study. [employer] Auth received. Auth # [id number]. Valid dates  09/21/19 to 10/21/19.

## 2019-09-16 ENCOUNTER — Telehealth: Payer: Self-pay | Admitting: *Deleted

## 2019-09-16 NOTE — Telephone Encounter (Signed)
Patient is scheduled for lab study on 10/14/19. Patient understands his sleep study will be done at [facility] sleep lab. Patient understands he will receive a sleep packet in a week or so. Patient understands to call if he does not receive the sleep packet in a timely manner. Patient agrees with treatment and thanked me for call.

## 2019-09-16 NOTE — Telephone Encounter (Signed)
-----   Message from Lauralee Evener, [place] sent at 09/15/2019  2:37 PM EDT ----- Regarding: RE: pt needs to be scheduled for sleep study per Dr. Acie Fredrickson and Kathyrn Drown NP Craig Staggers received. Ok to scheule sleep study. Auth # [id number]. Valid dates 09/21/19 to 10/21/19. ----- Message ----- From: Bobby Rumpf, CMA Sent: 09/09/2019  10:12 AM EDT To: Lauralee Evener, CMA, C[facility] Div Sleep Studies Subject: FW: pt needs to be scheduled for sleep study#  Mariann Laster,  Can we get a pre-cert initiated on this patient for a split night in lab study  Thanks, Patty Sermons  ----- Message ----- From: Nuala Alpha, LPN Sent: 11/07/766   8:55 AM EDT To: Lauralee Evener, CMA, Freada Bergeron, CMA, # Subject: pt needs to be scheduled for sleep study per#  Per Dr. Acie Fredrickson and Kathyrn Drown NP, they discharged this pt from the hospital for afib, suspected OSA, snoring.  They ordered for the pt to have a sleep study done.  Order placed and they wanted triage to send you a note to go ahead and work on getting this scheduled.  We also sent you a telephone call with more information about this.  Can you please schedule this pts sleep study and let triage know when its complete and scheduled, for triage follows Nahser's pts now.   Thanks for all you do, Ivy in Triage

## 2019-09-22 ENCOUNTER — Encounter: Payer: Self-pay | Admitting: Nurse Practitioner

## 2019-09-22 ENCOUNTER — Ambulatory Visit: Payer: Medicare HMO | Admitting: Nurse Practitioner

## 2019-09-22 ENCOUNTER — Other Ambulatory Visit: Payer: Self-pay

## 2019-09-22 VITALS — BP 126/80 | HR 86 | Ht 70.0 in | Wt 246.8 lb

## 2019-09-22 DIAGNOSIS — I4891 Unspecified atrial fibrillation: Secondary | ICD-10-CM | POA: Diagnosis not present

## 2019-09-22 DIAGNOSIS — R0683 Snoring: Secondary | ICD-10-CM | POA: Diagnosis not present

## 2019-09-22 MED ORDER — DILTIAZEM HCL 30 MG PO TABS: 30.0000 mg | ORAL_TABLET | Freq: Four times a day (QID) | ORAL | 6 refills | Status: DC | PRN

## 2019-09-22 NOTE — Patient Instructions (Addendum)
After Visit Summary:  We will be checking the following labs today - NONE  Medication Instructions:    Continue with your current medicines.   I am stopping the long acting Diltaizem  I am adding Cardizem 30 mg to take only "if needed" for heart racing/elevated HR - this is a short acting medicine - can take up to 4 times a day if needed.    If you need a refill on your cardiac medications before your next appointment, please call your pharmacy.     Testing/Procedures To Be Arranged:  Echo as planned  Sleep study as planned  Follow-Up:   See Dr. Acie Fredrickson in 3 months    At [facility], you and your health needs are our priority.  As part of our continuing mission to provide you with exceptional heart care, we have created designated Provider Care Teams.  These Care Teams include your primary Cardiologist (physician) and Advanced Practice Providers (APPs -  Physician Assistants and Nurse Practitioners) who all work together to provide you with the care you need, when you need it.  Special Instructions:  . Stay safe, wash your hands for at least 20 seconds and wear a mask when needed.  . It was good to talk with you today.    Call the [place] office at [phone] if you have any questions, problems or concerns.

## 2019-09-30 ENCOUNTER — Encounter: Payer: Self-pay | Admitting: Family Medicine

## 2019-09-30 ENCOUNTER — Other Ambulatory Visit: Payer: Self-pay | Admitting: Family Medicine

## 2019-09-30 ENCOUNTER — Ambulatory Visit (INDEPENDENT_AMBULATORY_CARE_PROVIDER_SITE_OTHER): Payer: Medicare HMO | Admitting: Family Medicine

## 2019-09-30 ENCOUNTER — Other Ambulatory Visit: Payer: Self-pay

## 2019-09-30 VITALS — BP 130/70 | HR 99 | Temp 98.7°F | Wt 250.2 lb

## 2019-09-30 DIAGNOSIS — Z209 Contact with and (suspected) exposure to unspecified communicable disease: Secondary | ICD-10-CM | POA: Diagnosis not present

## 2019-09-30 DIAGNOSIS — R05 Cough: Secondary | ICD-10-CM | POA: Diagnosis not present

## 2019-09-30 DIAGNOSIS — I4891 Unspecified atrial fibrillation: Secondary | ICD-10-CM | POA: Diagnosis not present

## 2019-09-30 DIAGNOSIS — R053 Chronic cough: Secondary | ICD-10-CM | POA: Insufficient documentation

## 2019-09-30 NOTE — Progress Notes (Signed)
   Subjective:    Patient ID: [name], male    DOB: [birth year], 59 y.o.   MRN: [id number]  HPI Here to discuss a dry cough that he has had for the past 18 months. Of note on 09-08-19 he presented to the ED with a heart rate of 180 and he was found to be in atrial fibrillation. After treating him with IV Cardizem, he converted to sinus rhythm and he has remained in NSR ever since. All labs were normal, as was a CXR. It was determined that the AF was likely the result of excessive caffeine use (he was drinking at least [place] a day) and at the time he was taking a lot of Mucinex DM and Nyquil/Dayquil for the cough. He has stopped all of these. He saw Cardiology recently and they stopped his daily CCB. Instead they gave him short acting Cardizem to take as needed. He has not taken any of this because his HR and BP at home have been well controlled. He is scheduled for an ECHO on 10-05-19, and he has an upcoming sleep apnea test. He has been on Prilosec continuously for over a year, but the cough persists. No SOB.    Review of Systems  Constitutional: Negative.   Respiratory: Positive for cough. Negative for choking, chest tightness, shortness of breath and wheezing.   Cardiovascular: Negative.        Objective:   Physical Exam Constitutional:      Appearance: Normal appearance.  Cardiovascular:     Rate and Rhythm: Normal rate and regular rhythm.     Pulses: Normal pulses.     Heart sounds: Normal heart sounds.  Pulmonary:     Effort: Pulmonary effort is normal.     Breath sounds: Normal breath sounds.  Neurological:     Mental Status: He is alert.           Assessment & Plan:  His atrial fibrillation seems to be well controlled and he remains in sinus rhythm. His ECHO and sleep study will be helpful. For the chronic cough, we agreed to see how these studies turn out, but afterwards we may refer him to Pulmonary.  [name], MD

## 2019-10-01 LAB — SARS-COV-2 ANTIBODY(IGG)SPIKE,SEMI-QUANTITATIVE: SARS COV1 AB(IGG)SPIKE,SEMI QN: <1 index (ref <1.00)

## 2019-10-05 ENCOUNTER — Ambulatory Visit (HOSPITAL_COMMUNITY): Payer: Medicare HMO | Attending: Cardiology

## 2019-10-05 ENCOUNTER — Other Ambulatory Visit: Payer: Self-pay

## 2019-10-05 DIAGNOSIS — I4891 Unspecified atrial fibrillation: Secondary | ICD-10-CM | POA: Diagnosis not present

## 2019-10-05 LAB — ECHOCARDIOGRAM COMPLETE
Area-P 1/2: 3.77 cm2
S' Lateral: 3.8 cm

## 2019-10-07 ENCOUNTER — Encounter: Payer: Self-pay | Admitting: Family Medicine

## 2019-10-07 MED ORDER — BENZONATATE 200 MG PO CAPS
200.0000 mg | ORAL_CAPSULE | Freq: Three times a day (TID) | ORAL | 1 refills | Status: DC | PRN
Start: 2019-10-07 — End: 2019-10-08

## 2019-10-07 NOTE — Telephone Encounter (Signed)
Patient scheduled for in lab study on 10/14/19.

## 2019-10-07 NOTE — Telephone Encounter (Signed)
I sent in some Benzonatate he can use

## 2019-10-08 ENCOUNTER — Other Ambulatory Visit: Payer: Self-pay | Admitting: Adult Health

## 2019-10-08 MED ORDER — BENZONATATE 200 MG PO CAPS: 200.0000 mg | ORAL_CAPSULE | Freq: Three times a day (TID) | ORAL | 1 refills | Status: DC | PRN

## 2019-10-13 ENCOUNTER — Encounter: Payer: Self-pay | Admitting: Family Medicine

## 2019-10-13 DIAGNOSIS — R053 Chronic cough: Secondary | ICD-10-CM

## 2019-10-14 ENCOUNTER — Ambulatory Visit (HOSPITAL_BASED_OUTPATIENT_CLINIC_OR_DEPARTMENT_OTHER): Payer: Medicare HMO | Attending: Cardiovascular Disease | Admitting: Cardiology

## 2019-10-14 ENCOUNTER — Other Ambulatory Visit: Payer: Self-pay

## 2019-10-14 VITALS — Ht 70.0 in | Wt 245.0 lb

## 2019-10-14 DIAGNOSIS — G4733 Obstructive sleep apnea (adult) (pediatric): Secondary | ICD-10-CM | POA: Diagnosis not present

## 2019-10-14 DIAGNOSIS — I4891 Unspecified atrial fibrillation: Secondary | ICD-10-CM | POA: Diagnosis not present

## 2019-10-14 DIAGNOSIS — R0902 Hypoxemia: Secondary | ICD-10-CM | POA: Diagnosis not present

## 2019-10-14 DIAGNOSIS — R0683 Snoring: Secondary | ICD-10-CM | POA: Insufficient documentation

## 2019-10-14 NOTE — Telephone Encounter (Signed)
The referral was done  

## 2019-10-15 NOTE — Procedures (Signed)
Patient Name: [name], [name] Date: 10/14/2019 Gender: Male D.O.B: 05-20-1960 Age (years): 59 Referring Provider: Mertie Moores Height (inches): 23 Interpreting Physician: Fransico Him MD, ABSM Weight (lbs): 245 RPSGT: [place]ug[place], [place] BMI: 35 MRN: [id number] Neck Size: 18.75  CLINICAL INFORMATION Sleep Study Type: Split Night CPAP  Indication for sleep study: Fatigue, OSA, Snoring, Witnessed Apneas  Epworth Sleepiness Score: 6  SLEEP STUDY TECHNIQUE As per the AASM Manual for the Scoring of Sleep and Associated Events v2.3 (April 2016) with a hypopnea requiring 4% desaturations.  The channels recorded and monitored were frontal, central and occipital EEG, electrooculogram (EOG), submentalis EMG (chin), nasal and oral airflow, thoracic and abdominal wall motion, anterior tibialis EMG, snore microphone, electrocardiogram, and pulse oximetry. Continuous positive airway pressure (CPAP) was initiated when the patient met split night criteria and was titrated according to treat sleep-disordered breathing.  MEDICATIONS Medications self-administered by patient taken the night of the study : N/A  RESPIRATORY PARAMETERS Diagnostic Total AHI (/hr): 21.0  RDI (/hr):77.8  OA Index (/hr): 0  CA Index (/hr): 0 REM AHI (/hr): 54.6  NREM AHI (/hr):18.6  Supine AHI (/hr):14.3  Non-supine AHI (/hr):34.5 Min O2 Sat (%):67.0  Mean O2 (%): 92.5  Time below 88% (min):7.3   Titration Optimal Pressure (cm):13  AHI at Optimal Pressure (/hr):0.7  Min O2 at Optimal Pressure (%):92.0 Supine % at Optimal (%):N/A  Sleep % at Optimal (%):N/A   SLEEP ARCHITECTURE The recording time for the entire night was 396.6 minutes.  During a baseline period of 258.1 minutes, the patient slept for 117.2 minutes in REM and nonREM, yielding a sleep efficiency of 45.4%. Sleep onset after lights out was 70.4 minutes with a REM latency of 93.5 minutes. The patient spent 43.9% of the night in stage N1  sleep, 49.5% in stage N2 sleep, 0% in stage N3 and 6.6% in REM.  During the titration period of 183.5 minutes, the patient slept for 99.0 minutes in REM and nonREM, yielding a sleep efficiency of 71.5%. Sleep onset after CPAP initiation was 13.4 minutes with a REM latency of 73.0 minutes. The patient spent 14.1% of the night in stage N1 sleep, 46.0% in stage N2 sleep, 18.2% in stage N3 and 21.7% in REM.  CARDIAC DATA The 2 lead EKG demonstrated sinus rhythm. The mean heart rate was 100.0 beats per minute. Other EKG findings include: None.  LEG MOVEMENT DATA The total Periodic Limb Movements of Sleep (PLMS) were0. The PLMS index was 0.  IMPRESSIONS - Moderate obstructive sleep apnea occurred during the diagnostic portion of the study (AHI = 21/hour). An optimal PAP pressure of 13 was selected for this patient based on the available study data. - No central sleep apnea occurred during the diagnostic portion of the study (CAI = 0/hour). - The patient had oxygen desaturation during the diagnostic portion of the study (Min O2 = 67%) - The patient snored with loud snoring volume during the diagnostic portion of the study. - No cardiac abnormalities were noted during this study. - No periodic limb movements of sleep occurred during the study.  DIAGNOSIS - Obstructive Sleep Apnea (G47.33) - Nocturnal Hypoxemia  RECOMMENDATIONS - Recommend CPAP at 13cm H2O with heated humidity and mask of choice - Avoid alcohol, sedatives and other CNS depressants that may worsen sleep apnea and disrupt normal sleep architecture. - Sleep hygiene should be reviewed to assess factors that may improve sleep quality. - Weight management and regular exercise should be initiated or continued. - Return to  Sleep Center for re-evaluation after 8 weeks of therapy  [Electronically signed] 10/15/2019 03:48 PM  Fransico Him MD, [place], American Board of Sleep Medicine   NPI: [id number]

## 2019-10-16 ENCOUNTER — Telehealth: Payer: Self-pay | Admitting: *Deleted

## 2019-10-16 NOTE — Telephone Encounter (Signed)
Informed patient of sleep study results and patient understanding was verbalized. Patient understands his sleep study showed they have significant sleep apnea and had successful PAP titration and will be set up with PAP unit. Please let DME know that order is in EPIC. Please set patient up for OV in 8 weeks.  Upon patient request DME selection is [facility]. Patient understands he will be contacted by [facility] to set up his cpap. Patient understands to call if [facility] does not contact him with new setup in a timely manner. Patient understands they will be called once confirmation has been received from [facility] that they have received their new machine to schedule 10 week follow up appointment.  [facility] notified of new cpap order  Please add to airview Patient was grateful for the call and thanked me.

## 2019-10-16 NOTE — Telephone Encounter (Signed)
-----   Message from Sueanne Margarita, MD sent at 10/15/2019  3:54 PM EDT ----- Please let patient know that they have significant sleep apnea and had successful PAP titration and will be set up with PAP unit.  Please let DME know that order is in EPIC.  Please set patient up for OV in 8 weeks

## 2019-10-22 NOTE — Telephone Encounter (Signed)
I did the referral to ENT  

## 2019-10-26 ENCOUNTER — Other Ambulatory Visit: Payer: Self-pay

## 2019-10-26 ENCOUNTER — Encounter: Payer: Self-pay | Admitting: Pulmonary Disease

## 2019-10-26 ENCOUNTER — Ambulatory Visit: Payer: Medicare HMO | Admitting: Pulmonary Disease

## 2019-10-26 VITALS — BP 134/78 | HR 83 | Temp 98.1°F | Ht 70.0 in | Wt 250.4 lb

## 2019-10-26 DIAGNOSIS — R053 Chronic cough: Secondary | ICD-10-CM

## 2019-10-26 DIAGNOSIS — R05 Cough: Secondary | ICD-10-CM

## 2019-10-26 MED ORDER — PREDNISONE 20 MG PO TABS
10.0000 mg | ORAL_TABLET | Freq: Every day | ORAL | 0 refills | Status: AC
Start: 2019-10-26 — End: 2019-11-02

## 2019-10-26 MED ORDER — AZITHROMYCIN 250 MG PO TABS: 250.0000 mg | ORAL_TABLET | Freq: Every day | ORAL | 0 refills | Status: DC

## 2019-10-26 NOTE — Progress Notes (Signed)
[name]    [id number]    28-May-1960  Primary Care Physician:Fry, Ishmael Holter, MD  Referring Physician: Laurey Morale, MD [place],  [place] [address]  Chief complaint:   Chronic cough since about 2020   HPI:  Been treated for reflux Denies any significant nasal stuffiness or congestion at present  Dry cough  Worse in the evenings prior to laying down  Recent diagnosis of obstructive sleep apnea for which she will be starting treatment Recent A. Fib-making behavioral changes and seems to be working  Never smoker  No occupational predisposition  Recent use of benzonatate has not really helped   Outpatient Encounter Medications as of 10/26/2019  Medication Sig  . aspirin 81 MG tablet Take 81 mg by mouth daily.  . benzonatate (TESSALON) 200 MG capsule Take 1 capsule (200 mg total) by mouth 3 (three) times daily as needed for cough.  . cholecalciferol (VITAMIN D3) 25 MCG (1000 UNIT) tablet Take 5,000 Units by mouth daily.  . diclofenac (VOLTAREN) 75 MG EC tablet Take 1 tablet (75 mg total) by mouth 2 (two) times daily.  Marland Kitchen omeprazole (PRILOSEC) 40 MG capsule Take 1 capsule (40 mg total) by mouth daily.  Marland Kitchen testosterone cypionate (DEPOTESTOSTERONE CYPIONATE) 200 MG/ML injection INJECT 1 ML (200 MG TOTAL) INTO THE MUSCLE EVERY 7 (SEVEN) DAYS.  Marland Kitchen traMADol (ULTRAM) 50 MG tablet Take 2 tablets (100 mg total) by mouth every 6 (six) hours as needed. for pain  . azithromycin (ZITHROMAX) 250 MG tablet Take 1 tablet (250 mg total) by mouth daily. Azithromycin 500 mg p.o. day 1 and 250 mg daily for 4 more days  . diltiazem (CARDIZEM) 30 MG tablet Take 1 tablet (30 mg total) by mouth 4 (four) times daily as needed (elevated HR/palpitations). (Patient not taking: Reported on 10/26/2019)  . predniSONE (DELTASONE) 20 MG tablet Take 0.5 tablets (10 mg total) by mouth daily with breakfast for 7 days.  . [DISCONTINUED] Guaifenesin 1200 MG TB12 Take 600 mg by  mouth daily.   No facility-administered encounter medications on file as of 10/26/2019.    Allergies as of 10/26/2019 - Review Complete 10/26/2019  Allergen Reaction Noted  . Flexeril [cyclobenzaprine]  12/15/2014  . Oxycodone-acetaminophen Nausea Only 12/17/2014  . Oxycodone-acetaminophen Nausea Only and Other (See Comments) 07/09/2012    Past Medical History:  Diagnosis Date  . Arthritis   . Cancer [facility])    prostate, sees Dr. Risa Grill   . Colon polyps   . Degenerative disc disease, cervical   . Degenerative disc disease, lumbar   . Depression   . GERD (gastroesophageal reflux disease)   . Low back pain   . Osteopenia     Past Surgical History:  Procedure Laterality Date  . CERVICAL FUSION  2015   C3 through C7, per Dr. Harl Bowie   . COLONOSCOPY  03/26/2018    per Dr. Alessandra Bevels, adenomatous polyps, repeat in 3 yrs   . CYSTECTOMY     benign, right hip  . GYNECOMASTIA EXCISION     bilateral, reduction  . JOINT REPLACEMENT  12-20-11   hx. LTHA, now RTHA planned  . LAMINECTOMY  2/09   lumbar  . LUMBAR FUSION  2015   L4 through S1, per Dr. Harl Bowie   . LUMBAR MICRODISCECTOMY  10/15/08   L-4-5, per Dr. Jenne Campus at [facility]  . PROSTATE SURGERY  12-13-10   robotic prostatectomy per Dr. Risa Grill   .  TOTAL HIP ARTHROPLASTY  12-20-11   2010 left hip  . TOTAL HIP ARTHROPLASTY  12/26/2011   Procedure: TOTAL HIP ARTHROPLASTY;  Surgeon: Gearlean Alf, MD;  Location: [facility];  Service: Orthopedics;  Laterality: Right;    Family History  Problem Relation Age of Onset  . Hypertension Father   . Suicidality Father   . Healthy Mother   . Arthritis Other   . Cancer Brother        Bone cancer    Social History   Socioeconomic History  . Marital status: Divorced    Spouse name: Not on file  . Number of children: Not on file  . Years of education: Not on file  . Highest education level: Not on file  Occupational History  . Not on file  Tobacco Use  . Smoking  status: Never Smoker  . Smokeless tobacco: Never Used  Substance and Sexual Activity  . Alcohol use: No    Alcohol/week: 0.0 standard drinks    Comment: rare  . Drug use: No  . Sexual activity: Yes  Other Topics Concern  . Not on file  Social History Narrative   Lives alone in a one story home.  No children.     On disability since September 2016.  He was previously working in Engineer, technical sales.    Education: high school.   Social Determinants of Health   Financial Resource Strain:   . Difficulty of Paying Living Expenses: Not on file  Food Insecurity:   . Worried About Charity fundraiser in the Last Year: Not on file  . Ran Out of Food in the Last Year: Not on file  Transportation Needs:   . Lack of Transportation (Medical): Not on file  . Lack of Transportation (Non-Medical): Not on file  Physical Activity:   . Days of Exercise per Week: Not on file  . Minutes of Exercise per Session: Not on file  Stress:   . Feeling of Stress : Not on file  Social Connections:   . Frequency of Communication with Friends and Family: Not on file  . Frequency of Social Gatherings with Friends and Family: Not on file  . Attends Religious Services: Not on file  . Active Member of Clubs or Organizations: Not on file  . Attends Archivist Meetings: Not on file  . Marital Status: Not on file  Intimate Partner Violence:   . Fear of Current or Ex-Partner: Not on file  . Emotionally Abused: Not on file  . Physically Abused: Not on file  . Sexually Abused: Not on file    Review of Systems  Respiratory: Positive for apnea and cough. Negative for shortness of breath.   Psychiatric/Behavioral: Positive for sleep disturbance.    Vitals:   10/26/19 0955  BP: 134/78  Pulse: 83  Temp: 98.1 F (36.7 C)  SpO2: 98%     Physical Exam Constitutional:      Appearance: He is obese.  HENT:     Head: Normocephalic.     Nose: Nose normal. No congestion.     Mouth/Throat:     Mouth: Mucous  membranes are moist.     Comments: Mallampati 3, crowded oropharynx Eyes:     General:        Right eye: No discharge.        Left eye: No discharge.  Cardiovascular:     Rate and Rhythm: Normal rate and regular rhythm.     Pulses: Normal pulses.  Heart sounds: No murmur heard.  No friction rub.  Pulmonary:     Effort: No respiratory distress.     Breath sounds: No stridor. No wheezing or rhonchi.  Musculoskeletal:        General: Normal range of motion.     Cervical back: No rigidity or tenderness.  Skin:    General: Skin is warm.  Neurological:     General: No focal deficit present.     Mental Status: He is alert.    Recent chest x-ray 09/08/2019-increased markings  Assessment:  Chronic cough -No history of occupational predisposition to lung disease -No history of asthma -No associated shortness of breath  Reflux -Symptoms appear adequately controlled  Obstructive sleep apnea -We will be starting CPAP therapy  Plan/Recommendations: We will treated with a course of steroids and antibiotics -Prednisone 40 daily -Course of Zithromax  Obtain a pulmonary function test  Encouraged to continue using benzonatate  Depending on pulmonary function test may require a CT scan of the chest to further evaluate  Role out bronchoscopy in a chronic cough following addressing other predisposition discussed-yield is usually low so will be as last resort  Follow-up in 4 to 6 weeks   Sherrilyn Rist MD [facility] 10/26/2019, 10:34 AM  CC: Laurey Morale, MD

## 2019-10-26 NOTE — Patient Instructions (Signed)
Chronic cough  Treat as an exacerbation with a course of steroids and short course of antibiotics  Obtain a pulmonary function test  Continue using medications for reflux  Follow-up with ENT as we discussed  Treatment for your obstructive sleep apnea  I will see you in about 4 to 6 weeks -Breathing study can be done on the next day you come in

## 2019-11-20 DIAGNOSIS — R05 Cough: Secondary | ICD-10-CM | POA: Diagnosis not present

## 2019-11-23 ENCOUNTER — Ambulatory Visit: Payer: Medicare HMO | Admitting: Pulmonary Disease

## 2019-11-23 ENCOUNTER — Other Ambulatory Visit: Payer: Self-pay

## 2019-11-23 ENCOUNTER — Encounter: Payer: Self-pay | Admitting: Pulmonary Disease

## 2019-11-23 ENCOUNTER — Ambulatory Visit (INDEPENDENT_AMBULATORY_CARE_PROVIDER_SITE_OTHER): Payer: Medicare HMO | Admitting: Pulmonary Disease

## 2019-11-23 VITALS — BP 118/78 | HR 71 | Temp 97.5°F | Ht 69.0 in | Wt 248.4 lb

## 2019-11-23 DIAGNOSIS — R05 Cough: Secondary | ICD-10-CM

## 2019-11-23 DIAGNOSIS — R053 Chronic cough: Secondary | ICD-10-CM

## 2019-11-23 MED ORDER — BENZONATATE 200 MG PO CAPS: 200.0000 mg | ORAL_CAPSULE | Freq: Three times a day (TID) | ORAL | 2 refills | Status: DC | PRN

## 2019-11-23 NOTE — Progress Notes (Signed)
PFT done today. 

## 2019-11-23 NOTE — Progress Notes (Signed)
BARNARD SHARPS    [id number]    [birth year]  Primary Care Physician:Fry, Ishmael Holter, MD  Referring Physician: Laurey Morale, MD [place],  [place] [address]  Chief complaint:   Chronic cough since about 2020 Cough feels a little bit better   HPI:  Been treated for reflux Denies any significant nasal stuffiness or congestion at present Continues to use benzonatate but less frequently  Reflux symptoms also appear a little bit improved  Dry cough  Worse in the evenings prior to laying down  Recent diagnosis of obstructive sleep apnea for which she will be starting treatment Recent A. Fib-making behavioral changes and seems to be working  Never smoker  No occupational predisposition  Recent use of benzonatate has not really helped   Outpatient Encounter Medications as of 11/23/2019  Medication Sig  . aspirin 81 MG tablet Take 81 mg by mouth daily.  . benzonatate (TESSALON) 200 MG capsule Take 1 capsule (200 mg total) by mouth 3 (three) times daily as needed for cough.  . cholecalciferol (VITAMIN D3) 25 MCG (1000 UNIT) tablet Take 5,000 Units by mouth daily.  . diclofenac (VOLTAREN) 75 MG EC tablet Take 1 tablet (75 mg total) by mouth 2 (two) times daily.  Marland Kitchen omeprazole (PRILOSEC) 40 MG capsule Take 1 capsule (40 mg total) by mouth daily.  Marland Kitchen testosterone cypionate (DEPOTESTOSTERONE CYPIONATE) 200 MG/ML injection INJECT 1 ML (200 MG TOTAL) INTO THE MUSCLE EVERY 7 (SEVEN) DAYS.  Marland Kitchen traMADol (ULTRAM) 50 MG tablet Take 2 tablets (100 mg total) by mouth every 6 (six) hours as needed. for pain  . azithromycin (ZITHROMAX) 250 MG tablet Take 1 tablet (250 mg total) by mouth daily. Azithromycin 500 mg p.o. day 1 and 250 mg daily for 4 more days  . diltiazem (CARDIZEM) 30 MG tablet Take 1 tablet (30 mg total) by mouth 4 (four) times daily as needed (elevated HR/palpitations). (Patient not taking: Reported on 10/26/2019)   No facility-administered encounter  medications on file as of 11/23/2019.    Allergies as of 11/23/2019 - Review Complete 11/23/2019  Allergen Reaction Noted  . Flexeril [cyclobenzaprine]  12/15/2014  . Oxycodone-acetaminophen Nausea Only 12/17/2014  . Oxycodone-acetaminophen Nausea Only and Other (See Comments) 07/09/2012    Past Medical History:  Diagnosis Date  . Arthritis   . Cancer [facility])    prostate, sees Dr. Risa Grill   . Colon polyps   . Degenerative disc disease, cervical   . Degenerative disc disease, lumbar   . Depression   . GERD (gastroesophageal reflux disease)   . Low back pain   . Osteopenia     Past Surgical History:  Procedure Laterality Date  . CERVICAL FUSION  2015   C3 through C7, per Dr. Harl Bowie   . COLONOSCOPY  03/26/2018    per Dr. Alessandra Bevels, adenomatous polyps, repeat in 3 yrs   . CYSTECTOMY     benign, right hip  . GYNECOMASTIA EXCISION     bilateral, reduction  . JOINT REPLACEMENT  12-20-11   hx. LTHA, now RTHA planned  . LAMINECTOMY  2/09   lumbar  . LUMBAR FUSION  2015   L4 through S1, per Dr. Harl Bowie   . LUMBAR MICRODISCECTOMY  10/15/08   L-4-5, per Dr. Jenne Campus at [facility]  . PROSTATE SURGERY  12-13-10   robotic prostatectomy per Dr. Risa Grill   . TOTAL HIP ARTHROPLASTY  12-20-11   2010 left hip  .  TOTAL HIP ARTHROPLASTY  12/26/2011   Procedure: TOTAL HIP ARTHROPLASTY;  Surgeon: Gearlean Alf, MD;  Location: [facility];  Service: Orthopedics;  Laterality: Right;    Family History  Problem Relation Age of Onset  . Hypertension Father   . Suicidality Father   . Healthy Mother   . Arthritis Other   . Cancer Brother        Bone cancer    Social History   Socioeconomic History  . Marital status: Divorced    Spouse name: Not on file  . Number of children: Not on file  . Years of education: Not on file  . Highest education level: Not on file  Occupational History  . Not on file  Tobacco Use  . Smoking status: Never Smoker  . Smokeless tobacco: Never  Used  Substance and Sexual Activity  . Alcohol use: No    Alcohol/week: 0.0 standard drinks    Comment: rare  . Drug use: No  . Sexual activity: Yes  Other Topics Concern  . Not on file  Social History Narrative   Lives alone in a one story home.  No children.     On disability since September 2016.  He was previously working in Engineer, technical sales.    Education: high school.   Social Determinants of Health   Financial Resource Strain:   . Difficulty of Paying Living Expenses: Not on file  Food Insecurity:   . Worried About Charity fundraiser in the Last Year: Not on file  . Ran Out of Food in the Last Year: Not on file  Transportation Needs:   . Lack of Transportation (Medical): Not on file  . Lack of Transportation (Non-Medical): Not on file  Physical Activity:   . Days of Exercise per Week: Not on file  . Minutes of Exercise per Session: Not on file  Stress:   . Feeling of Stress : Not on file  Social Connections:   . Frequency of Communication with Friends and Family: Not on file  . Frequency of Social Gatherings with Friends and Family: Not on file  . Attends Religious Services: Not on file  . Active Member of Clubs or Organizations: Not on file  . Attends Archivist Meetings: Not on file  . Marital Status: Not on file  Intimate Partner Violence:   . Fear of Current or Ex-Partner: Not on file  . Emotionally Abused: Not on file  . Physically Abused: Not on file  . Sexually Abused: Not on file    Review of Systems  Respiratory: Positive for apnea and cough. Negative for shortness of breath.   Psychiatric/Behavioral: Positive for sleep disturbance.    Vitals:   11/23/19 1605  BP: 118/78  Pulse: 71  Temp: (!) 97.5 F (36.4 C)  SpO2: 97%     Physical Exam Constitutional:      Appearance: He is obese.  HENT:     Head: Normocephalic.     Nose: Nose normal. No congestion.     Mouth/Throat:     Mouth: Mucous membranes are moist.     Comments: Mallampati 3,  crowded oropharynx Eyes:     General:        Right eye: No discharge.        Left eye: No discharge.  Cardiovascular:     Rate and Rhythm: Normal rate and regular rhythm.     Pulses: Normal pulses.     Heart sounds: No murmur heard.  No friction  rub.  Pulmonary:     Effort: No respiratory distress.     Breath sounds: No stridor. No wheezing or rhonchi.  Musculoskeletal:     Cervical back: No rigidity or tenderness.  Neurological:     Mental Status: He is alert.    Recent chest x-ray showing elevated in the diaphragm on the left  Pulmonary function test reveals restrictive physiology  Assessment:  Chronic cough  -No history of occupational predisposition to lung disease -No history of asthma -Denies shortness of breath at present  Reflux -Symptoms appear adequately controlled  Obstructive sleep apnea -CPAP therapy  Plan/Recommendations:  -Continue benzonatate -Graded exercise as tolerated  Clinical monitoring of symptoms  No role for bronchoscopy at present  Follow-up in 6 months    Sherrilyn Rist MD [facility] 11/23/2019, 4:39 PM  CC: Laurey Morale, MD

## 2019-11-23 NOTE — Patient Instructions (Addendum)
Continue using benzonatate as needed  Your breathing study today was relatively normal  Call with any significant concerns  I will see you back in about 6 months

## 2019-11-24 DIAGNOSIS — N486 Induration penis plastica: Secondary | ICD-10-CM | POA: Diagnosis not present

## 2019-11-24 DIAGNOSIS — N5231 Erectile dysfunction following radical prostatectomy: Secondary | ICD-10-CM | POA: Diagnosis not present

## 2019-12-03 LAB — PULMONARY FUNCTION TEST
DL/VA % pred: 110 %
DL/VA: 4.76 ml/min/mmHg/L
DLCO cor % pred: 102 %
DLCO cor: 28.32 ml/min/mmHg
DLCO unc % pred: 102 %
DLCO unc: 28.32 ml/min/mmHg
FEF 25-75 Post: 4.11 L/sec
FEF 25-75 Pre: 3.04 L/sec
FEF2575-%Change-Post: 35 %
FEF2575-%Pred-Post: 133 %
FEF2575-%Pred-Pre: 98 %
FEV1-%Change-Post: 7 %
FEV1-%Pred-Post: 86 %
FEV1-%Pred-Pre: 80 %
FEV1-Post: 3.16 L
FEV1-Pre: 2.94 L
FEV1FVC-%Change-Post: 8 %
FEV1FVC-%Pred-Pre: 103 %
FEV6-%Change-Post: 0 %
FEV6-%Pred-Post: 80 %
FEV6-%Pred-Pre: 80 %
FEV6-Post: 3.7 L
FEV6-Pre: 3.69 L
FEV6FVC-%Change-Post: 0 %
FEV6FVC-%Pred-Post: 104 %
FEV6FVC-%Pred-Pre: 103 %
FVC-%Change-Post: 0 %
FVC-%Pred-Post: 77 %
FVC-%Pred-Pre: 77 %
FVC-Post: 3.7 L
Post FEV1/FVC ratio: 86 %
Post FEV6/FVC ratio: 100 %
Pre FEV1/FVC ratio: 79 %
Pre FEV6/FVC Ratio: 99 %

## 2019-12-09 ENCOUNTER — Other Ambulatory Visit: Payer: Self-pay | Admitting: Family Medicine

## 2019-12-17 ENCOUNTER — Encounter: Payer: Self-pay | Admitting: Cardiovascular Disease

## 2019-12-17 NOTE — Progress Notes (Signed)
Cardiology Office Note:    Date:  12/18/2019   ID:  [name], DOB [birth year], MRN [id number]  PCP:  Laurey Morale, MD  [facility] Cardiologist:  Mertie Moores, MD  [place] Electrophysiologist:  None   Referring MD: Laurey Morale, MD   Chief Complaint  Patient presents with  . Atrial Fibrillation   Problem List 1. Paroxysmal atrial fib 2.  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  Obstructive sleep apnea.  CPAP started Aug. 11, 2021   Oct. 15, 2021   [name] is a 59 y.o. male with a hx of atrial fib, OSA. I met him during a hospitalization several months ago . The afib was associated with an upper respiratory infection  He converted back to NSR with cardizem drip  TSH was normal  Echo -  Aug. 2, 2021 shows normal LV systolic function, mild diastolic dysfunction. Mild dilatation of the ascending aorta 39 mm  Had sleep study.   Had 31 apneac episodes per hour.  Felt better the next day .  Still waiting on his CPAP machine.  Wt. Is 246 ( down 13 lbs )  Is a body builder  Has had a chronic cough.   Has seen pulmonary        Past Medical History:  Diagnosis Date  . Arthritis   . Cancer [facility])    prostate, sees Dr. Risa Grill   . Colon polyps   . Degenerative disc disease, cervical   . Degenerative disc disease, lumbar   . Depression   . GERD (gastroesophageal reflux disease)   . Low back pain   . Osteopenia     Past Surgical History:  Procedure Laterality Date  . CERVICAL FUSION  2015   C3 through C7, per Dr. Harl Bowie   . COLONOSCOPY  03/26/2018    per Dr. Alessandra Bevels, adenomatous polyps, repeat in 3 yrs   . CYSTECTOMY     benign, right hip  . GYNECOMASTIA EXCISION     bilateral, reduction  . JOINT REPLACEMENT  12-20-11   hx. LTHA, now RTHA planned  . LAMINECTOMY  2/09   lumbar  . LUMBAR FUSION  2015   L4 through S1, per Dr. Harl Bowie   . LUMBAR MICRODISCECTOMY  10/15/08   L-4-5, per Dr. Jenne Campus at [facility]  .  PROSTATE SURGERY  12-13-10   robotic prostatectomy per Dr. Risa Grill   . TOTAL HIP ARTHROPLASTY  12-20-11   2010 left hip  . TOTAL HIP ARTHROPLASTY  12/26/2011   Procedure: TOTAL HIP ARTHROPLASTY;  Surgeon: Gearlean Alf, MD;  Location: [facility];  Service: Orthopedics;  Laterality: Right;    Current Medications: Current Meds  Medication Sig  . aspirin 81 MG tablet Take 81 mg by mouth daily.  . benzonatate (TESSALON) 200 MG capsule Take 1 capsule (200 mg total) by mouth 3 (three) times daily as needed for cough.  . cholecalciferol (VITAMIN D3) 25 MCG (1000 UNIT) tablet Take 5,000 Units by mouth daily.  . diclofenac (VOLTAREN) 75 MG EC tablet Take 1 tablet (75 mg total) by mouth 2 (two) times daily.  Marland Kitchen diltiazem (CARDIZEM) 30 MG tablet Take 1 tablet (30 mg total) by mouth 4 (four) times daily as needed (elevated HR/palpitations).  Marland Kitchen omeprazole (PRILOSEC) 40 MG capsule Take 1 capsule (40 mg total) by mouth daily.  Marland Kitchen testosterone cypionate (DEPOTESTOSTERONE CYPIONATE) 200 MG/ML injection INJECT 1 ML (200 MG TOTAL) INTO THE MUSCLE EVERY 7 (SEVEN) DAYS.  Marland Kitchen  traMADol (ULTRAM) 50 MG tablet Take 2 tablets (100 mg total) by mouth every 6 (six) hours as needed. for pain     Allergies:   Flexeril [cyclobenzaprine], Oxycodone-acetaminophen, and Oxycodone-acetaminophen   Social History   Socioeconomic History  . Marital status: Divorced    Spouse name: Not on file  . Number of children: Not on file  . Years of education: Not on file  . Highest education level: Not on file  Occupational History  . Not on file  Tobacco Use  . Smoking status: Never Smoker  . Smokeless tobacco: Never Used  Substance and Sexual Activity  . Alcohol use: No    Alcohol/week: 0.0 standard drinks    Comment: rare  . Drug use: No  . Sexual activity: Yes  Other Topics Concern  . Not on file  Social History Narrative   Lives alone in a one story home.  No children.     On disability since September 2016.  He was  previously working in Engineer, technical sales.    Education: high school.   Social Determinants of Health   Financial Resource Strain:   . Difficulty of Paying Living Expenses: Not on file  Food Insecurity:   . Worried About Charity fundraiser in the Last Year: Not on file  . Ran Out of Food in the Last Year: Not on file  Transportation Needs:   . Lack of Transportation (Medical): Not on file  . Lack of Transportation (Non-Medical): Not on file  Physical Activity:   . Days of Exercise per Week: Not on file  . Minutes of Exercise per Session: Not on file  Stress:   . Feeling of Stress : Not on file  Social Connections:   . Frequency of Communication with Friends and Family: Not on file  . Frequency of Social Gatherings with Friends and Family: Not on file  . Attends Religious Services: Not on file  . Active Member of Clubs or Organizations: Not on file  . Attends Archivist Meetings: Not on file  . Marital Status: Not on file     Family History: The patient's family history includes Arthritis in an other family member; Cancer in his brother; Healthy in his mother; Hypertension in his father; Suicidality in his father.  ROS:   Please see the history of present illness.     All other systems reviewed and are negative.  EKGs/Labs/Other Studies Reviewed:    The following studies were reviewed today:  EKG:     Recent Labs: 09/08/2019: ALT 29; Magnesium 2.0 09/09/2019: BUN 8; Creatinine, Ser 0.95; Hemoglobin 12.0; Platelets 329; Potassium 3.5; Sodium 136; TSH 2.014  Recent Lipid Panel    Component Value Date/Time   CHOL 201 (H) 09/09/2019 0450   TRIG 106 09/09/2019 0450   HDL 27 (L) 09/09/2019 0450   CHOLHDL 7.4 09/09/2019 0450   VLDL 21 09/09/2019 0450   LDLCALC 153 (H) 09/09/2019 0450   LDLDIRECT 138.8 08/17/2009 0755     Risk Assessment/Calculations:     CHA2DS2-VASc Score = 0 0 This indicates a 0.2% annual risk of stroke. The patient's score is based upon: CHF History:  0 HTN History: 0 Diabetes History: 0 Stroke History: 0 Vascular Disease History: 0 Age Score: 0 Gender Score: 0       Physical Exam:    VS:  BP 116/84   Pulse 92   Ht $R'5\' 10"'fh$  (1.778 m)   Wt 246 lb 9.6 oz (111.9 kg)   SpO2  97%   BMI 35.38 kg/m     Wt Readings from Last 3 Encounters:  12/18/19 246 lb 9.6 oz (111.9 kg)  11/23/19 248 lb 6.4 oz (112.7 kg)  10/26/19 250 lb 6.4 oz (113.6 kg)     GEN:  Well nourished, middle age , muscular man, well developed in no acute distress HEENT: Normal NECK: No JVD; No carotid bruits LYMPHATICS: No lymphadenopathy CARDIAC: RRR, no murmurs, rubs, gallops RESPIRATORY:  Clear to auscultation without rales, wheezing or rhonchi  ABDOMEN: Soft, non-tender, non-distended MUSCULOSKELETAL:  No edema; No deformity  SKIN: Warm and dry NEUROLOGIC:  Alert and oriented x 3 PSYCHIATRIC:  Normal affect   ASSESSMENT:    No diagnosis found. PLAN:    In order of problems listed above:  1. Paroxysmal atrial fibrillation: [name]  presents for further evaluation of his paroxysmal atrial fibrillation.  He is not had any recurrent episodes.  He has been found to have obstructive sleep apnea.  During his sleep study, he was found to have 31 apneic episodes per hour.  He wore the CPAP for the second part of the night and just after 3 hours of sleep he felt remarkably better.  Unfortunately he is not been able to get his CPAP machine delivered yet because of supply chain issues.  He is looking forward to being set up on his CPAP  He is CHA2DS2-VASc or is 0.  He is not on anticoagulation.  He is not had any further arrhythmias.   Medication Adjustments/Labs and Tests Ordered: Current medicines are reviewed at length with the patient today.  Concerns regarding medicines are outlined above.  No orders of the defined types were placed in this encounter.  No orders of the defined types were placed in this encounter.   Patient Instructions  Medication  Instructions:  Your physician recommends that you continue on your current medications as directed. Please refer to the Current Medication list given to you today.  *If you need a refill on your cardiac medications before your next appointment, please call your pharmacy*   Lab Work: None Ordered If you have labs (blood work) drawn today and your tests are completely normal, you will receive your results only by: Marland Kitchen MyChart Message (if you have MyChart) OR . A paper copy in the mail If you have any lab test that is abnormal or we need to change your treatment, we will call you to review the results.   Testing/Procedures: None Ordered   Follow-Up: At [facility], you and your health needs are our priority.  As part of our continuing mission to provide you with exceptional heart care, we have created designated Provider Care Teams.  These Care Teams include your primary Cardiologist (physician) and Advanced Practice Providers (APPs -  Physician Assistants and Nurse Practitioners) who all work together to provide you with the care you need, when you need it.    Your next appointment:   1 year(s)  The format for your next appointment:   In Person  Provider:   You may see Mertie Moores, MD or one of the following Advanced Practice Providers on your designated Care Team:    Richardson Dopp, PA-C  Robbie Lis, [place]        Signed, Mertie Moores, MD  12/18/2019 10:11 AM    [place]

## 2019-12-18 ENCOUNTER — Other Ambulatory Visit: Payer: Self-pay

## 2019-12-18 ENCOUNTER — Encounter: Payer: Self-pay | Admitting: Cardiovascular Disease

## 2019-12-18 ENCOUNTER — Ambulatory Visit: Payer: Medicare HMO | Admitting: Cardiovascular Disease

## 2019-12-18 VITALS — BP 116/84 | HR 92 | Ht 70.0 in | Wt 246.6 lb

## 2019-12-18 DIAGNOSIS — I4891 Unspecified atrial fibrillation: Secondary | ICD-10-CM

## 2019-12-18 NOTE — Patient Instructions (Signed)

## 2019-12-23 DIAGNOSIS — R3981 Functional urinary incontinence: Secondary | ICD-10-CM | POA: Diagnosis not present

## 2019-12-23 DIAGNOSIS — E291 Testicular hypofunction: Secondary | ICD-10-CM | POA: Diagnosis not present

## 2019-12-23 DIAGNOSIS — Z8546 Personal history of malignant neoplasm of prostate: Secondary | ICD-10-CM | POA: Diagnosis not present

## 2019-12-24 DIAGNOSIS — G4733 Obstructive sleep apnea (adult) (pediatric): Secondary | ICD-10-CM | POA: Diagnosis not present

## 2020-01-21 DIAGNOSIS — J069 Acute upper respiratory infection, unspecified: Secondary | ICD-10-CM | POA: Diagnosis not present

## 2020-01-21 DIAGNOSIS — Z20822 Contact with and (suspected) exposure to covid-19: Secondary | ICD-10-CM | POA: Diagnosis not present

## 2020-01-24 DIAGNOSIS — G4733 Obstructive sleep apnea (adult) (pediatric): Secondary | ICD-10-CM | POA: Diagnosis not present

## 2020-02-16 ENCOUNTER — Telehealth: Payer: Medicare HMO | Admitting: Family Medicine

## 2020-02-16 ENCOUNTER — Telehealth (INDEPENDENT_AMBULATORY_CARE_PROVIDER_SITE_OTHER): Payer: Medicare HMO | Admitting: Family Medicine

## 2020-02-16 DIAGNOSIS — R0981 Nasal congestion: Secondary | ICD-10-CM | POA: Diagnosis not present

## 2020-02-16 DIAGNOSIS — R059 Cough, unspecified: Secondary | ICD-10-CM | POA: Diagnosis not present

## 2020-02-16 DIAGNOSIS — Z20822 Contact with and (suspected) exposure to covid-19: Secondary | ICD-10-CM | POA: Diagnosis not present

## 2020-02-16 MED ORDER — BENZONATATE 100 MG PO CAPS: 100.0000 mg | ORAL_CAPSULE | Freq: Three times a day (TID) | ORAL | 0 refills | Status: DC | PRN

## 2020-02-16 NOTE — Progress Notes (Signed)
Virtual Visit via Video Note  I connected with [name]  on 02/16/20 at  4:20 PM EST by a video enabled telemedicine application and verified that I am speaking with the correct person using two identifiers.  Location patient: home, [place] Location provider:work or home office Persons participating in the virtual visit: patient, provider  I discussed the limitations of evaluation and management by telemedicine and the availability of in person appointments. The patient expressed understanding and agreed to proceed.   HPI:  Acute telemedicine visit for sinus issues: -Onset: 3 days ago -Symptoms include: sore throat, low grade temp 99, sweating, cough, ears are stopped up, sneezing -did a covid test today, results pending -Denies:CP, SOB, NVD, loss of taste or smell -one of his friends had similar symptoms last week -Has tried:corecedin  -Pertinent past medical history: -Pertinent medication allergies: -COVID-19 vaccine status: fully vaccinated for COVID19 (less 6 months ago); no flu shot yet  ROS: See pertinent positives and negatives per HPI.  Past Medical History:  Diagnosis Date  . Arthritis   . Cancer [facility])    prostate, sees Dr. Risa Grill   . Colon polyps   . Degenerative disc disease, cervical   . Degenerative disc disease, lumbar   . Depression   . GERD (gastroesophageal reflux disease)   . Low back pain   . Osteopenia     Past Surgical History:  Procedure Laterality Date  . CERVICAL FUSION  2015   C3 through C7, per Dr. Harl Bowie   . COLONOSCOPY  03/26/2018    per Dr. Alessandra Bevels, adenomatous polyps, repeat in 3 yrs   . CYSTECTOMY     benign, right hip  . GYNECOMASTIA EXCISION     bilateral, reduction  . JOINT REPLACEMENT  12-20-11   hx. LTHA, now RTHA planned  . LAMINECTOMY  2/09   lumbar  . LUMBAR FUSION  2015   L4 through S1, per Dr. Harl Bowie   . LUMBAR MICRODISCECTOMY  10/15/08   L-4-5, per Dr. Jenne Campus at [facility]  . PROSTATE SURGERY  12-13-10    robotic prostatectomy per Dr. Risa Grill   . TOTAL HIP ARTHROPLASTY  12-20-11   2010 left hip  . TOTAL HIP ARTHROPLASTY  12/26/2011   Procedure: TOTAL HIP ARTHROPLASTY;  Surgeon: Gearlean Alf, MD;  Location: [facility];  Service: Orthopedics;  Laterality: Right;     Current Outpatient Medications:  .  aspirin 81 MG tablet, Take 81 mg by mouth daily., Disp: , Rfl:  .  benzonatate (TESSALON PERLES) 100 MG capsule, Take 1 capsule (100 mg total) by mouth 3 (three) times daily as needed., Disp: 20 capsule, Rfl: 0 .  cholecalciferol (VITAMIN D3) 25 MCG (1000 UNIT) tablet, Take 5,000 Units by mouth daily., Disp: , Rfl:  .  diclofenac (VOLTAREN) 75 MG EC tablet, Take 1 tablet (75 mg total) by mouth 2 (two) times daily., Disp: 180 tablet, Rfl: 3 .  diltiazem (CARDIZEM) 30 MG tablet, Take 1 tablet (30 mg total) by mouth 4 (four) times daily as needed (elevated HR/palpitations)., Disp: 30 tablet, Rfl: 6 .  omeprazole (PRILOSEC) 40 MG capsule, Take 1 capsule (40 mg total) by mouth daily., Disp: 90 capsule, Rfl: 3 .  testosterone cypionate (DEPOTESTOSTERONE CYPIONATE) 200 MG/ML injection, INJECT 1 ML (200 MG TOTAL) INTO THE MUSCLE EVERY 7 (SEVEN) DAYS., Disp: 10 mL, Rfl: 2 .  traMADol (ULTRAM) 50 MG tablet, Take 2 tablets (100 mg total) by mouth every 6 (six) hours as needed. for pain, Disp: 720 tablet,  Rfl: 1  EXAM:  VITALS per patient if applicable:  GENERAL: alert, oriented, appears well and in no acute distress  HEENT: atraumatic, conjunttiva clear, no obvious abnormalities on inspection of external nose and ears  NECK: normal movements of the head and neck  LUNGS: on inspection no signs of respiratory distress, breathing rate appears normal, no obvious gross SOB, gasping or wheezing  CV: no obvious cyanosis  MS: moves all visible extremities without noticeable abnormality  PSYCH/NEURO: pleasant and cooperative, no obvious depression or anxiety, speech and thought processing grossly  intact  ASSESSMENT AND PLAN:  Discussed the following assessment and plan:  Nasal congestion  Cough  -we discussed possible serious and likely etiologies, options for evaluation and workup, limitations of telemedicine visit vs in person visit, treatment, treatment risks and precautions. Pt prefers to treat via telemedicine empirically rather than in person at this moment.  Query likely VURI vs other. He opted for Tessalon for cough, nasal saline and other symptomatic home care summarized in patient instructions.  He is waiting on Covid testing results.  Plans to stay home while sick and pending those results.  Did discuss treatment, potential complications and precautions should that test come back positive. Work/School slipped offered:  declined Scheduled follow up with PCP offered: Agrees to follow-up if needed Advised to seek prompt in person care if worsening, new symptoms arise, or if is not improving with treatment. Discussed options for inperson care if PCP office not available. Did let this patient know that I only do telemedicine on Tuesdays and Thursdays for [place]. Advised to schedule follow up visit with PCP or [facility] if any further questions or concerns to avoid delays in care.   I discussed the assessment and treatment plan with the patient. The patient was provided an opportunity to ask questions and all were answered. The patient agreed with the plan and demonstrated an understanding of the instructions.     Lucretia Kern, DO

## 2020-02-16 NOTE — Patient Instructions (Signed)
  HOME CARE TIPS:  -[place] testing information: https://www.rivera-powers.org/ OR [phone] Most pharmacies also offer testing and home test kits.  -I sent the medication(s) we discussed to your pharmacy: Meds ordered this encounter  Medications  . benzonatate (TESSALON PERLES) 100 MG capsule    Sig: Take 1 capsule (100 mg total) by mouth 3 (three) times daily as needed.    Dispense:  20 capsule    Refill:  0     -COVID19 outpatient treatment center: [phone] (only call if your Covid test is positive and you are interested in monoclonal antibody treatment which is available to those with risk factors within 7 days of symptom onset)  -can use tylenol  if needed for fevers, aches and pains per instructions  -can use nasal saline a few times per day if nasal congestion  -stay hydrated, drink plenty of fluids and eat small healthy meals - avoid dairy  -can take 1000 IU Vit D3 and Vit C lozenges per instructions  -If the Covid test is positive, check out the CDC website for more information on home care, transmission and treatment for COVID19  -follow up with your doctor in 2-3 days unless improving and feeling better  -stay home while sick, except to seek medical care, and if you have COVID19 please stay home for a full 10 days since the onset of symptoms PLUS one day of no fever and feeling better.  It was nice to meet you today, and I really hope you are feeling better soon. I help [place] out with telemedicine visits on Tuesdays and Thursdays and am available for visits on those days. If you have any concerns or questions following this visit please schedule a follow up visit with your Primary Care doctor or seek care at a local urgent care clinic to avoid delays in care.    Seek in person care promptly if your symptoms worsen, new concerns arise or you are not improving with treatment. Call 911 and/or seek emergency care if you  symptoms are severe or life threatening.

## 2020-02-23 ENCOUNTER — Telehealth: Payer: Medicare HMO | Admitting: Family Medicine

## 2020-02-23 DIAGNOSIS — G4733 Obstructive sleep apnea (adult) (pediatric): Secondary | ICD-10-CM | POA: Diagnosis not present

## 2020-03-03 ENCOUNTER — Other Ambulatory Visit: Payer: Self-pay

## 2020-03-07 ENCOUNTER — Other Ambulatory Visit: Payer: Self-pay | Admitting: *Deleted

## 2020-03-08 ENCOUNTER — Encounter: Payer: Self-pay | Admitting: Family Medicine

## 2020-03-08 ENCOUNTER — Other Ambulatory Visit: Payer: Self-pay

## 2020-03-08 ENCOUNTER — Ambulatory Visit (INDEPENDENT_AMBULATORY_CARE_PROVIDER_SITE_OTHER): Payer: Medicare HMO | Admitting: Family Medicine

## 2020-03-08 VITALS — BP 124/80 | HR 96 | Temp 98.4°F | Ht 70.0 in | Wt 245.6 lb

## 2020-03-08 DIAGNOSIS — B029 Zoster without complications: Secondary | ICD-10-CM

## 2020-03-08 DIAGNOSIS — I10 Essential (primary) hypertension: Secondary | ICD-10-CM | POA: Diagnosis not present

## 2020-03-08 DIAGNOSIS — R739 Hyperglycemia, unspecified: Secondary | ICD-10-CM

## 2020-03-08 DIAGNOSIS — E785 Hyperlipidemia, unspecified: Secondary | ICD-10-CM | POA: Diagnosis not present

## 2020-03-08 DIAGNOSIS — E291 Testicular hypofunction: Secondary | ICD-10-CM

## 2020-03-08 LAB — LIPID PANEL
Cholesterol: 196 mg/dL (ref 0–200)
HDL: 29.3 mg/dL — ABNORMAL LOW (ref >39.00)
LDL Cholesterol: 139 mg/dL — ABNORMAL HIGH (ref 0–99)
NonHDL: 166.27
Total CHOL/HDL Ratio: 7
Triglycerides: 137 mg/dL (ref 0.0–149.0)
VLDL: 27.4 mg/dL (ref 0.0–40.0)

## 2020-03-08 LAB — HEPATIC FUNCTION PANEL
ALT: 34 U/L (ref 0–53)
AST: 27 U/L (ref 0–37)
Albumin: 4.7 g/dL (ref 3.5–5.2)
Alkaline Phosphatase: 73 U/L (ref 39–117)
Bilirubin, Direct: 0.1 mg/dL (ref 0.0–0.3)
Total Bilirubin: 0.8 mg/dL (ref 0.2–1.2)
Total Protein: 7.6 g/dL (ref 6.0–8.3)

## 2020-03-08 LAB — CBC WITH DIFFERENTIAL/PLATELET
Basophils Absolute: 0 10*3/uL (ref 0.0–0.1)
Basophils Relative: 0.5 % (ref 0.0–3.0)
Eosinophils Absolute: 0.1 10*3/uL (ref 0.0–0.7)
Eosinophils Relative: 1.9 % (ref 0.0–5.0)
HCT: 43.5 % (ref 39.0–52.0)
Hemoglobin: 13.5 g/dL (ref 13.0–17.0)
Lymphocytes Relative: 19.3 % (ref 12.0–46.0)
Lymphs Abs: 1.2 10*3/uL (ref 0.7–4.0)
MCHC: 31.2 g/dL (ref 30.0–36.0)
MCV: 67.3 fl — ABNORMAL LOW (ref 78.0–100.0)
Monocytes Absolute: 0.9 10*3/uL (ref 0.1–1.0)
Monocytes Relative: 13.5 % — ABNORMAL HIGH (ref 3.0–12.0)
Neutro Abs: 4.1 10*3/uL (ref 1.4–7.7)
Neutrophils Relative %: 64.8 % (ref 43.0–77.0)
Platelets: 317 10*3/uL (ref 150.0–400.0)
RBC: 6.46 Mil/uL — ABNORMAL HIGH (ref 4.22–5.81)
RDW: 20.6 % — ABNORMAL HIGH (ref 11.5–15.5)
WBC: 6.3 10*3/uL (ref 4.0–10.5)

## 2020-03-08 LAB — TESTOSTERONE: Testosterone: 785.16 ng/dL (ref 300.00–890.00)

## 2020-03-08 LAB — HEMOGLOBIN A1C: Hgb A1c MFr Bld: 5.6 % (ref 4.6–6.5)

## 2020-03-08 MED ORDER — TRAMADOL HCL 50 MG PO TABS
100.0000 mg | ORAL_TABLET | Freq: Four times a day (QID) | ORAL | 1 refills | Status: DC | PRN
Start: 2020-03-08 — End: 2020-08-26

## 2020-03-08 MED ORDER — TRAMADOL HCL 50 MG PO TABS
100.0000 mg | ORAL_TABLET | Freq: Four times a day (QID) | ORAL | 1 refills | Status: DC | PRN
Start: 2020-03-08 — End: 2020-11-23

## 2020-03-08 MED ORDER — ANASTROZOLE 1 MG PO TABS: 1.0000 mg | ORAL_TABLET | Freq: Every day | ORAL | 0 refills | Status: DC

## 2020-03-08 MED ORDER — VALACYCLOVIR HCL 1 G PO TABS: 1000.0000 mg | ORAL_TABLET | Freq: Three times a day (TID) | ORAL | 0 refills | Status: DC

## 2020-03-08 NOTE — Progress Notes (Signed)
   Subjective:    Patient ID: [name], male    DOB: 1961-02-25, 60 y.o.   MRN: [id number]  HPI Here for several issues. First he is asking for lab work. We have been following his lipids and glucose, and he has been slightly anemic. He is due to check his testosterone level. He saw a provider in [facility] about his testosterone levels, and they started him on Arimidex to block his estrogen levels. He asks [place] to check this today. Finally about 2 weeks ago he developed a burning pain in the left posterior neck that then spread across the entire left side of his head and face.    Review of Systems  Constitutional: Negative.   Respiratory: Negative.   Cardiovascular: Negative.        Objective:   Physical Exam Constitutional:      Appearance: Normal appearance.  Cardiovascular:     Rate and Rhythm: Normal rate and regular rhythm.     Pulses: Normal pulses.     Heart sounds: Normal heart sounds.  Pulmonary:     Effort: Pulmonary effort is normal.     Breath sounds: Normal breath sounds.  Skin:    Comments: There is a small patch of red vesicles on the left posterior neck   Neurological:     Mental Status: He is alert.           Assessment & Plan:  He has shingles, and we will treat this with 10 days of Valtrex. His HTN is stable. We will check fasting labs for lipids, a CBC, an A1c, and for testosterone and estrogens.  Gershon Crane, MD

## 2020-03-13 LAB — ESTROGENS, TOTAL: Estrogen: 338 pg/mL — ABNORMAL HIGH (ref 60–190)

## 2020-03-14 ENCOUNTER — Encounter: Payer: Self-pay | Admitting: Family Medicine

## 2020-03-15 NOTE — Telephone Encounter (Signed)
I would take the anastrozole every day. Then check levels in 3 months

## 2020-03-16 MED ORDER — ANASTROZOLE 1 MG PO TABS
1.0000 mg | ORAL_TABLET | Freq: Every day | ORAL | 3 refills | Status: DC
Start: 2020-03-16 — End: 2021-03-14

## 2020-03-16 NOTE — Telephone Encounter (Signed)
Done

## 2020-03-23 ENCOUNTER — Encounter: Payer: Self-pay | Admitting: Family Medicine

## 2020-03-23 MED ORDER — METHYLPREDNISOLONE 4 MG PO TBPK: ORAL_TABLET | ORAL | 0 refills | Status: DC

## 2020-03-23 NOTE — Telephone Encounter (Signed)
Tell him that is not a bad idea. I sent in a Medrol dose pack

## 2020-03-25 DIAGNOSIS — G4733 Obstructive sleep apnea (adult) (pediatric): Secondary | ICD-10-CM | POA: Diagnosis not present

## 2020-03-30 DIAGNOSIS — M25552 Pain in left hip: Secondary | ICD-10-CM | POA: Insufficient documentation

## 2020-03-31 DIAGNOSIS — M25551 Pain in right hip: Secondary | ICD-10-CM | POA: Diagnosis not present

## 2020-03-31 DIAGNOSIS — Z96642 Presence of left artificial hip joint: Secondary | ICD-10-CM | POA: Diagnosis not present

## 2020-03-31 DIAGNOSIS — M25552 Pain in left hip: Secondary | ICD-10-CM | POA: Diagnosis not present

## 2020-04-07 DIAGNOSIS — G4710 Hypersomnia, unspecified: Secondary | ICD-10-CM | POA: Diagnosis not present

## 2020-04-07 DIAGNOSIS — G4733 Obstructive sleep apnea (adult) (pediatric): Secondary | ICD-10-CM | POA: Diagnosis not present

## 2020-04-12 ENCOUNTER — Encounter: Payer: Self-pay | Admitting: Family Medicine

## 2020-04-12 DIAGNOSIS — Z20822 Contact with and (suspected) exposure to covid-19: Secondary | ICD-10-CM | POA: Diagnosis not present

## 2020-04-12 DIAGNOSIS — Z03818 Encounter for observation for suspected exposure to other biological agents ruled out: Secondary | ICD-10-CM | POA: Diagnosis not present

## 2020-04-12 MED ORDER — GABAPENTIN 100 MG PO CAPS
100.0000 mg | ORAL_CAPSULE | Freq: Three times a day (TID) | ORAL | 1 refills | Status: DC
Start: 2020-04-12 — End: 2020-09-20

## 2020-04-12 NOTE — Telephone Encounter (Signed)
I sent in Gabapentin to take TID. We can increase the dose quite a bit if needed

## 2020-04-14 ENCOUNTER — Encounter: Payer: Self-pay | Admitting: Family Medicine

## 2020-04-14 ENCOUNTER — Telehealth (INDEPENDENT_AMBULATORY_CARE_PROVIDER_SITE_OTHER): Payer: Medicare HMO | Admitting: Family Medicine

## 2020-04-14 VITALS — Temp 98.6°F

## 2020-04-14 DIAGNOSIS — U071 COVID-19: Secondary | ICD-10-CM | POA: Diagnosis not present

## 2020-04-14 DIAGNOSIS — J989 Respiratory disorder, unspecified: Secondary | ICD-10-CM

## 2020-04-14 DIAGNOSIS — Z20822 Contact with and (suspected) exposure to covid-19: Secondary | ICD-10-CM

## 2020-04-14 MED ORDER — BENZONATATE 100 MG PO CAPS: 100.0000 mg | ORAL_CAPSULE | Freq: Three times a day (TID) | ORAL | 0 refills | Status: DC | PRN

## 2020-04-14 NOTE — Patient Instructions (Signed)
  HOME CARE TIPS:  -[place] testing information: https://www.rivera-powers.org/ OR [phone] Most pharmacies also offer testing and home test kits. If you have a positive covid test please follow up promptly with your primary care doctor if you are interested in a referral for possible treatment.   -I sent the medication(s) we discussed to your pharmacy: Meds ordered this encounter  Medications  . benzonatate (TESSALON PERLES) 100 MG capsule    Sig: Take 1 capsule (100 mg total) by mouth 3 (three) times daily as needed.    Dispense:  20 capsule    Refill:  0     -can use tylenol or aleve if needed for fevers, aches and pains per instructions  -can use nasal saline a few times per day if you have nasal congestion  -stay hydrated, drink plenty of fluids and eat small healthy meals - avoid dairy  -can take 1000 IU (51mcg) Vit D3 and 100-500 mg of Vit C daily per instructions  -If the Covid test is positive, check out the CDC website for more information on home care, transmission and treatment for [place]  -stay home while sick, except to seek medical care, and if you have COVID19 ideally it would be best to stay home for a full 10 days since the onset of symptoms PLUS one day of no fever and feeling better. Wear a good mask (such as N95 or KN95) if around others to reduce the risk of transmission.  It was nice to meet you today, and I really hope you are feeling better soon. I help [place] out with telemedicine visits on Tuesdays and Thursdays and am available for visits on those days. If you have any concerns or questions following this visit please schedule a follow up visit with your Primary Care doctor or seek care at a local urgent care clinic to avoid delays in care.    Seek in person care or schedule a follow up video visit promptly if your symptoms worsen, new concerns arise or you are not improving with treatment. Call 911 and/or seek  emergency care if your symptoms are severe or life threatening.

## 2020-04-14 NOTE — Telephone Encounter (Signed)
Spoke with the pt and scheduled a virtual visit with Dr Maudie Mercury today at [place] as patient preferred this time due to Covid test at 3:45pm.

## 2020-04-14 NOTE — Progress Notes (Signed)
Virtual Visit via Video Note  I connected with [name]  on 04/14/20 at  5:00 PM EST by a video enabled telemedicine application and verified that I am speaking with the correct person using two identifiers.  Location patient: home, [place] Location provider:work or home office Persons participating in the virtual visit: patient, provider  I discussed the limitations of evaluation and management by telemedicine and the availability of in person appointments. The patient expressed understanding and agreed to proceed.   HPI:  Acute telemedicine visit for flu like symptoms and covid exposure: -Onset: had close covid exposure 03/11/2019; symptoms started yesterday -Symptoms include: sore throat, cough, "fever" 99.1, feels more tired than usual, sweats, headache this morning, diarrhea -Denies:CP, SOB, body aches, vomiting, inability to eat/drink/get out of bed -Has tried: vit c, vit D, zinc, coricidin,  -Pertinent past medical history: HTN, A. Fib - possibly from cold medication, prostate cancer - removed, obesity listed - body builder  -Pertinent medication allergies:percocet, flexeril -COVID-19 vaccine status: has had 2 pfizer shots  ROS: See pertinent positives and negatives per HPI.  Past Medical History:  Diagnosis Date  . Arthritis   . Cancer [facility])    prostate, sees Dr. Risa Grill   . Colon polyps   . Degenerative disc disease, cervical   . Degenerative disc disease, lumbar   . Depression   . GERD (gastroesophageal reflux disease)   . Low back pain   . Osteopenia     Past Surgical History:  Procedure Laterality Date  . CERVICAL FUSION  2015   C3 through C7, per Dr. Harl Bowie   . COLONOSCOPY  03/26/2018    per Dr. Alessandra Bevels, adenomatous polyps, repeat in 3 yrs   . CYSTECTOMY     benign, right hip  . GYNECOMASTIA EXCISION     bilateral, reduction  . JOINT REPLACEMENT  12-20-11   hx. LTHA, now RTHA planned  . LAMINECTOMY  2/09   lumbar  . LUMBAR FUSION  2015   L4 through S1, per  Dr. Harl Bowie   . LUMBAR MICRODISCECTOMY  10/15/08   L-4-5, per Dr. Jenne Campus at [facility]  . PROSTATE SURGERY  12-13-10   robotic prostatectomy per Dr. Risa Grill   . TOTAL HIP ARTHROPLASTY  12-20-11   2010 left hip  . TOTAL HIP ARTHROPLASTY  12/26/2011   Procedure: TOTAL HIP ARTHROPLASTY;  Surgeon: Gearlean Alf, MD;  Location: [facility];  Service: Orthopedics;  Laterality: Right;     Current Outpatient Medications:  .  anastrozole (ARIMIDEX) 1 MG tablet, Take 1 tablet (1 mg total) by mouth daily., Disp: 90 tablet, Rfl: 3 .  aspirin 81 MG tablet, Take 81 mg by mouth daily., Disp: , Rfl:  .  benzonatate (TESSALON PERLES) 100 MG capsule, Take 1 capsule (100 mg total) by mouth 3 (three) times daily as needed., Disp: 20 capsule, Rfl: 0 .  cholecalciferol (VITAMIN D3) 25 MCG (1000 UNIT) tablet, Take 20,000 Units by mouth daily., Disp: , Rfl:  .  diclofenac (VOLTAREN) 75 MG EC tablet, Take 1 tablet (75 mg total) by mouth 2 (two) times daily., Disp: 180 tablet, Rfl: 3 .  diltiazem (CARDIZEM) 30 MG tablet, Take 1 tablet (30 mg total) by mouth 4 (four) times daily as needed (elevated HR/palpitations)., Disp: 30 tablet, Rfl: 6 .  gabapentin (NEURONTIN) 100 MG capsule, Take 1 capsule (100 mg total) by mouth 3 (three) times daily., Disp: 90 capsule, Rfl: 1 .  omeprazole (PRILOSEC) 40 MG capsule, Take 1 capsule (40 mg total)  by mouth daily., Disp: 90 capsule, Rfl: 3 .  testosterone cypionate (DEPOTESTOSTERONE CYPIONATE) 200 MG/ML injection, INJECT 1 ML (200 MG TOTAL) INTO THE MUSCLE EVERY 7 (SEVEN) DAYS., Disp: 10 mL, Rfl: 2 .  traMADol (ULTRAM) 50 MG tablet, Take 2 tablets (100 mg total) by mouth every 6 (six) hours as needed. for pain, Disp: 720 tablet, Rfl: 1 .  traMADol (ULTRAM) 50 MG tablet, Take 2 tablets (100 mg total) by mouth every 6 (six) hours as needed. for pain, Disp: 720 tablet, Rfl: 1  EXAM:  VITALS per patient if applicable:  GENERAL: alert, oriented, appears well and in no  acute distress  HEENT: atraumatic, conjunttiva clear, no obvious abnormalities on inspection of external nose and ears  NECK: normal movements of the head and neck  LUNGS: on inspection no signs of respiratory distress, breathing rate appears normal, no obvious gross SOB, gasping or wheezing  CV: no obvious cyanosis  MS: moves all visible extremities without noticeable abnormality  PSYCH/NEURO: pleasant and cooperative, no obvious depression or anxiety, speech and thought processing grossly intact  ASSESSMENT AND PLAN:  Discussed the following assessment and plan:  Respiratory illness  Close exposure to COVID-19 virus  -we discussed possible serious and likely etiologies, options for evaluation and workup, limitations of telemedicine visit vs in person visit, treatment, treatment risks and precautions. Pt prefers to treat via telemedicine empirically rather than in person at this moment.  Query COVID-19 most likely versus other.  Covid testing is pending.  Discussed symptomatic care measures and sent Tessalon Rx for cough in the interim.  Discussed treatment options, ideal treatment window, potential complications, isolation and precautions for COVID-19.  He agrees to schedule follow-up video visit if the Covid test is positive wishes to consider referral for possible treatment. Work/School slipped offered:declined Scheduled follow up with PCP offered: Agrees to schedule follow-up through his primary care office if needed. Advised to seek prompt in person care if worsening, new symptoms arise, or if is not improving with treatment. Discussed options for inperson care if PCP office not available. Did let this patient know that I only do telemedicine on Tuesdays and Thursdays for [place]. Advised to schedule follow up visit with PCP or [facility] if any further questions or concerns to avoid delays in care.   I discussed the assessment and treatment plan with the patient. The patient was provided  an opportunity to ask questions and all were answered. The patient agreed with the plan and demonstrated an understanding of the instructions.     Lucretia Kern, DO

## 2020-04-15 ENCOUNTER — Telehealth (INDEPENDENT_AMBULATORY_CARE_PROVIDER_SITE_OTHER): Payer: Medicare HMO | Admitting: Family Medicine

## 2020-04-15 DIAGNOSIS — U071 COVID-19: Secondary | ICD-10-CM

## 2020-04-15 MED ORDER — MOLNUPIRAVIR EUA 200MG CAPSULE: 4.0000 | ORAL_CAPSULE | Freq: Two times a day (BID) | ORAL | 0 refills | Status: AC

## 2020-04-15 NOTE — Addendum Note (Signed)
Addended by: Eulas Post on: 04/15/2020 04:44 PM   Modules accepted: Orders

## 2020-04-15 NOTE — Progress Notes (Signed)
Patient ID: [name], male   DOB: 11-Mar-1960, 60 y.o.   MRN: [id number]   This visit type was conducted due to national recommendations for restrictions regarding the COVID-19 pandemic in an effort to limit this patient's exposure and mitigate transmission in our community.   Virtual Visit via Video Note  I connected with [name] on 04/15/20 at 10:00 AM EST by a video enabled telemedicine application and verified that I am speaking with the correct person using two identifiers.  Location patient: home Location provider:work or home office Persons participating in the virtual visit: patient, provider  I discussed the limitations of evaluation and management by telemedicine and the availability of in person appointments. The patient expressed understanding and agreed to proceed.   HPI: William had exposure to someone last weekend who subsequently tested positive for Covid.  He developed symptoms 2 days ago on Wednesday which was the ninth.  He went to [facility] 2 days ago on the ninth and had positive Covid test then.  This was a rapid test.  He had some mild nasal congestion and very mild cough.  Last night he had fever 99.7 with increased body aches and sore throat.  He had headaches.  O2 sats 94% last night which was a decline and back up to 97 to 98% today.  Denies any nausea, vomiting, or diarrhea.  Treating symptomatically with Tylenol and Coricidin.  Was sent in [facility] yesterday.  Cough relatively mild.  He did have COVID vaccine with Pfizer vaccines back in August.  No booster.  Risk factors include BMI of 35 and hypertension history.  No chronic lung disease.   ROS: See pertinent positives and negatives per HPI.  Past Medical History:  Diagnosis Date  . Arthritis   . Cancer [facility])    prostate, sees Dr. Risa Grill   . Colon polyps   . Degenerative disc disease, cervical   . Degenerative disc disease, lumbar   . Depression   . GERD (gastroesophageal reflux disease)    . Low back pain   . Osteopenia     Past Surgical History:  Procedure Laterality Date  . CERVICAL FUSION  2015   C3 through C7, per Dr. Harl Bowie   . COLONOSCOPY  03/26/2018    per Dr. Alessandra Bevels, adenomatous polyps, repeat in 3 yrs   . CYSTECTOMY     benign, right hip  . GYNECOMASTIA EXCISION     bilateral, reduction  . JOINT REPLACEMENT  12-20-11   hx. LTHA, now RTHA planned  . LAMINECTOMY  2/09   lumbar  . LUMBAR FUSION  2015   L4 through S1, per Dr. Harl Bowie   . LUMBAR MICRODISCECTOMY  10/15/08   L-4-5, per Dr. Jenne Campus at [facility]  . PROSTATE SURGERY  12-13-10   robotic prostatectomy per Dr. Risa Grill   . TOTAL HIP ARTHROPLASTY  12-20-11   2010 left hip  . TOTAL HIP ARTHROPLASTY  12/26/2011   Procedure: TOTAL HIP ARTHROPLASTY;  Surgeon: Gearlean Alf, MD;  Location: [facility];  Service: Orthopedics;  Laterality: Right;    Family History  Problem Relation Age of Onset  . Hypertension Father   . Suicidality Father   . Healthy Mother   . Arthritis Other   . Cancer Brother        Bone cancer    SOCIAL HX: Non-smoker   Current Outpatient Medications:  .  anastrozole (ARIMIDEX) 1 MG tablet, Take 1 tablet (1 mg total) by mouth daily., Disp:  90 tablet, Rfl: 3 .  aspirin 81 MG tablet, Take 81 mg by mouth daily., Disp: , Rfl:  .  benzonatate (TESSALON PERLES) 100 MG capsule, Take 1 capsule (100 mg total) by mouth 3 (three) times daily as needed., Disp: 20 capsule, Rfl: 0 .  cholecalciferol (VITAMIN D3) 25 MCG (1000 UNIT) tablet, Take 20,000 Units by mouth daily., Disp: , Rfl:  .  diclofenac (VOLTAREN) 75 MG EC tablet, Take 1 tablet (75 mg total) by mouth 2 (two) times daily., Disp: 180 tablet, Rfl: 3 .  diltiazem (CARDIZEM) 30 MG tablet, Take 1 tablet (30 mg total) by mouth 4 (four) times daily as needed (elevated HR/palpitations)., Disp: 30 tablet, Rfl: 6 .  gabapentin (NEURONTIN) 100 MG capsule, Take 1 capsule (100 mg total) by mouth 3 (three) times daily.,  Disp: 90 capsule, Rfl: 1 .  omeprazole (PRILOSEC) 40 MG capsule, Take 1 capsule (40 mg total) by mouth daily., Disp: 90 capsule, Rfl: 3 .  testosterone cypionate (DEPOTESTOSTERONE CYPIONATE) 200 MG/ML injection, INJECT 1 ML (200 MG TOTAL) INTO THE MUSCLE EVERY 7 (SEVEN) DAYS., Disp: 10 mL, Rfl: 2 .  traMADol (ULTRAM) 50 MG tablet, Take 2 tablets (100 mg total) by mouth every 6 (six) hours as needed. for pain, Disp: 720 tablet, Rfl: 1 .  traMADol (ULTRAM) 50 MG tablet, Take 2 tablets (100 mg total) by mouth every 6 (six) hours as needed. for pain, Disp: 720 tablet, Rfl: 1  EXAM:  VITALS per patient if applicable:  GENERAL: alert, oriented, appears well and in no acute distress  HEENT: atraumatic, conjunttiva clear, no obvious abnormalities on inspection of external nose and ears  NECK: normal movements of the head and neck  LUNGS: on inspection no signs of respiratory distress, breathing rate appears normal, no obvious gross SOB, gasping or wheezing  CV: no obvious cyanosis  MS: moves all visible extremities without noticeable abnormality  PSYCH/NEURO: pleasant and cooperative, no obvious depression or anxiety, speech and thought processing grossly intact  ASSESSMENT AND PLAN:  Discussed the following assessment and plan:  COVID - Plan: Ambulatory referral for Covid Treatment  -He knows to stay isolated at this time. -We discussed limited availability of monoclonal antibodies but we will go ahead and place referral for possible treatment.  Fortunately he is only about 48 hours into this. -Plenty of fluids and rest. -Continue to monitor O2 sats and be in touch of dipping down to 90     I discussed the assessment and treatment plan with the patient. The patient was provided an opportunity to ask questions and all were answered. The patient agreed with the plan and demonstrated an understanding of the instructions.   The patient was advised to call back or seek an in-person  evaluation if the symptoms worsen or if the condition fails to improve as anticipated.     Carolann Littler, MD

## 2020-04-16 ENCOUNTER — Telehealth: Payer: Self-pay | Admitting: Unknown Physician Specialty

## 2020-04-16 NOTE — Telephone Encounter (Signed)
Called to discuss with patient about COVID-19 symptoms and the use of one of the available treatments for those with mild to moderate Covid symptoms and at a high risk of hospitalization.  Pt appears to qualify for outpatient treatment due to co-morbid conditions and/or a member of an at-risk group in accordance with the FDA Emergency Use Authorization.    Symptom onset: 2/8 Vaccinated: yes Booster? no Immunocompromised? no Qualifiers:multiple  Pt given Molnupiravir by family physician.  Will need blood chemistry to get Paxlovid.  Incompletely vaccinated.  Recommended to take the Molnupiravir, call us and we will see if he can qualify for mab.    Kathrine Haddock

## 2020-04-18 ENCOUNTER — Encounter: Payer: Self-pay | Admitting: Family Medicine

## 2020-04-18 ENCOUNTER — Telehealth (INDEPENDENT_AMBULATORY_CARE_PROVIDER_SITE_OTHER): Payer: Medicare HMO | Admitting: Family Medicine

## 2020-04-18 VITALS — Temp 98.0°F | Ht 70.0 in | Wt 235.0 lb

## 2020-04-18 DIAGNOSIS — K921 Melena: Secondary | ICD-10-CM

## 2020-04-18 DIAGNOSIS — U071 COVID-19: Secondary | ICD-10-CM | POA: Diagnosis not present

## 2020-04-18 DIAGNOSIS — M542 Cervicalgia: Secondary | ICD-10-CM

## 2020-04-18 MED ORDER — METHYLPREDNISOLONE 4 MG PO TBPK: ORAL_TABLET | ORAL | 0 refills | Status: DC

## 2020-04-18 NOTE — Progress Notes (Signed)
Subjective:    Patient ID: [name], male    DOB: 12/09/1960, 60 y.o.   MRN: [id number]  HPI Virtual Visit via Video Note  I connected with the patient on 04/18/20 at  3:45 PM EST by a video enabled telemedicine application and verified that I am speaking with the correct person using two identifiers.  Location patient: home Location provider:work or home office Persons participating in the virtual visit: patient, provider  I discussed the limitations of evaluation and management by telemedicine and the availability of in person appointments. The patient expressed understanding and agreed to proceed.   HPI: Here for several issues. First he has had some bright red blood in his stools for a few days, along with diarrhea. He developed some ST, stuffy head, body aches, and diarrhea last week, and on 04-14-20 he tested positive for the Covid virus. He has had no fever or cough or SOB or vomiting. He had a virtual visit with [place] and was prescribed 5 days of Molnupiravir. Since starting this he now feels much better, though he still has some diarrhea. There is no rectal pain. Also he has had tingling and burning in the left side of the face and in the left neck area. He was recently treated for shingles in this are, and after taking a Medrol dose pack the burning pains immediately improved. Now that he has been dealing with the Covid infection, the same burning has returned. No visible rash.    ROS: See pertinent positives and negatives per HPI.  Past Medical History:  Diagnosis Date  . Arthritis   . Cancer [facility])    prostate, sees Dr. Risa Grill   . Colon polyps   . Degenerative disc disease, cervical   . Degenerative disc disease, lumbar   . Depression   . GERD (gastroesophageal reflux disease)   . Low back pain   . Osteopenia     Past Surgical History:  Procedure Laterality Date  . CERVICAL FUSION  2015   C3 through C7, per Dr. Harl Bowie   . COLONOSCOPY  03/26/2018    per Dr.  Alessandra Bevels, adenomatous polyps, repeat in 3 yrs   . CYSTECTOMY     benign, right hip  . GYNECOMASTIA EXCISION     bilateral, reduction  . JOINT REPLACEMENT  12-20-11   hx. LTHA, now RTHA planned  . LAMINECTOMY  2/09   lumbar  . LUMBAR FUSION  2015   L4 through S1, per Dr. Harl Bowie   . LUMBAR MICRODISCECTOMY  10/15/08   L-4-5, per Dr. Jenne Campus at [facility]  . PROSTATE SURGERY  12-13-10   robotic prostatectomy per Dr. Risa Grill   . TOTAL HIP ARTHROPLASTY  12-20-11   2010 left hip  . TOTAL HIP ARTHROPLASTY  12/26/2011   Procedure: TOTAL HIP ARTHROPLASTY;  Surgeon: Gearlean Alf, MD;  Location: [facility];  Service: Orthopedics;  Laterality: Right;    Family History  Problem Relation Age of Onset  . Hypertension Father   . Suicidality Father   . Healthy Mother   . Arthritis Other   . Cancer Brother        Bone cancer     Current Outpatient Medications:  .  anastrozole (ARIMIDEX) 1 MG tablet, Take 1 tablet (1 mg total) by mouth daily., Disp: 90 tablet, Rfl: 3 .  aspirin 81 MG tablet, Take 81 mg by mouth daily., Disp: , Rfl:  .  benzonatate (TESSALON PERLES) 100 MG capsule, Take 1 capsule (100  mg total) by mouth 3 (three) times daily as needed., Disp: 20 capsule, Rfl: 0 .  cholecalciferol (VITAMIN D3) 25 MCG (1000 UNIT) tablet, Take 20,000 Units by mouth daily., Disp: , Rfl:  .  diclofenac (VOLTAREN) 75 MG EC tablet, Take 1 tablet (75 mg total) by mouth 2 (two) times daily., Disp: 180 tablet, Rfl: 3 .  gabapentin (NEURONTIN) 100 MG capsule, Take 1 capsule (100 mg total) by mouth 3 (three) times daily., Disp: 90 capsule, Rfl: 1 .  methylPREDNISolone (MEDROL DOSEPAK) 4 MG TBPK tablet, As directed, Disp: 21 tablet, Rfl: 0 .  molnupiravir EUA 200 mg CAPS, Take 4 capsules (800 mg total) by mouth 2 (two) times daily for 5 days., Disp: 40 capsule, Rfl: 0 .  omeprazole (PRILOSEC) 40 MG capsule, Take 1 capsule (40 mg total) by mouth daily., Disp: 90 capsule, Rfl: 3 .  testosterone  cypionate (DEPOTESTOSTERONE CYPIONATE) 200 MG/ML injection, INJECT 1 ML (200 MG TOTAL) INTO THE MUSCLE EVERY 7 (SEVEN) DAYS., Disp: 10 mL, Rfl: 2 .  traMADol (ULTRAM) 50 MG tablet, Take 2 tablets (100 mg total) by mouth every 6 (six) hours as needed. for pain, Disp: 720 tablet, Rfl: 1 .  diltiazem (CARDIZEM) 30 MG tablet, Take 1 tablet (30 mg total) by mouth 4 (four) times daily as needed (elevated HR/palpitations). (Patient not taking: Reported on 04/18/2020), Disp: 30 tablet, Rfl: 6 .  traMADol (ULTRAM) 50 MG tablet, Take 2 tablets (100 mg total) by mouth every 6 (six) hours as needed. for pain (Patient not taking: Reported on 04/18/2020), Disp: 720 tablet, Rfl: 1  EXAM:  VITALS per patient if applicable:  GENERAL: alert, oriented, appears well and in no acute distress  HEENT: atraumatic, conjunttiva clear, no obvious abnormalities on inspection of external nose and ears  NECK: normal movements of the head and neck  LUNGS: on inspection no signs of respiratory distress, breathing rate appears normal, no obvious gross SOB, gasping or wheezing  CV: no obvious cyanosis  MS: moves all visible extremities without noticeable abnormality  PSYCH/NEURO: pleasant and cooperative, no obvious depression or anxiety, speech and thought processing grossly intact  ASSESSMENT AND PLAN: He is recovering from a Covid-19 infection. He will finish out the 5 days of Molnupiravir. I reassured him the blood in his stools is likely the result of hemorrhoidal bleeding from the diarrhea and not the result of a direct side effect of this medication. He will let us know if the bleeding continues. As for the burning pains in the neck area, we will treat this with another Medrol dose pack. He will follow this up as needed.  [name], MD  Discussed the following assessment and plan:  No diagnosis found.     I discussed the assessment and treatment plan with the patient. The patient was provided an opportunity to  ask questions and all were answered. The patient agreed with the plan and demonstrated an understanding of the instructions.   The patient was advised to call back or seek an in-person evaluation if the symptoms worsen or if the condition fails to improve as anticipated.     Review of Systems     Objective:   Physical Exam        Assessment & Plan:

## 2020-04-21 ENCOUNTER — Other Ambulatory Visit: Payer: Self-pay | Admitting: Family Medicine

## 2020-04-25 ENCOUNTER — Other Ambulatory Visit: Payer: Self-pay

## 2020-04-25 DIAGNOSIS — G4733 Obstructive sleep apnea (adult) (pediatric): Secondary | ICD-10-CM | POA: Diagnosis not present

## 2020-04-26 ENCOUNTER — Other Ambulatory Visit: Payer: Self-pay | Admitting: Family Medicine

## 2020-04-26 ENCOUNTER — Encounter: Payer: Self-pay | Admitting: Family Medicine

## 2020-04-26 MED ORDER — TESTOSTERONE CYPIONATE 200 MG/ML IM SOLN: 200.0000 mg | INTRAMUSCULAR | 1 refills | Status: DC

## 2020-04-26 NOTE — Telephone Encounter (Signed)
Please advise on this  Med refill that looks as if it may have been printed.  Thanks.  Dm/cma

## 2020-04-27 MED ORDER — TESTOSTERONE CYPIONATE 200 MG/ML IM SOLN: 200.0000 mg | INTRAMUSCULAR | 1 refills | Status: DC

## 2020-04-27 NOTE — Telephone Encounter (Signed)
The rx is ready to be picked up

## 2020-04-27 NOTE — Telephone Encounter (Signed)
Called pt aware that his Rx is ready for pick up at the office, pt state that he will pick up this afternoon

## 2020-04-28 DIAGNOSIS — Z20822 Contact with and (suspected) exposure to covid-19: Secondary | ICD-10-CM | POA: Diagnosis not present

## 2020-04-28 DIAGNOSIS — Z03818 Encounter for observation for suspected exposure to other biological agents ruled out: Secondary | ICD-10-CM | POA: Diagnosis not present

## 2020-05-05 DIAGNOSIS — G4733 Obstructive sleep apnea (adult) (pediatric): Secondary | ICD-10-CM | POA: Diagnosis not present

## 2020-05-13 ENCOUNTER — Other Ambulatory Visit: Payer: Self-pay | Admitting: Family Medicine

## 2020-05-14 ENCOUNTER — Other Ambulatory Visit: Payer: Self-pay | Admitting: Family Medicine

## 2020-05-16 ENCOUNTER — Encounter: Payer: Self-pay | Admitting: Family Medicine

## 2020-05-17 MED ORDER — DICLOFENAC SODIUM 75 MG PO TBEC: 75.0000 mg | DELAYED_RELEASE_TABLET | Freq: Two times a day (BID) | ORAL | 3 refills | Status: DC

## 2020-05-17 NOTE — Telephone Encounter (Signed)
Done

## 2020-05-23 DIAGNOSIS — G4733 Obstructive sleep apnea (adult) (pediatric): Secondary | ICD-10-CM | POA: Diagnosis not present

## 2020-06-01 DIAGNOSIS — G4733 Obstructive sleep apnea (adult) (pediatric): Secondary | ICD-10-CM | POA: Diagnosis not present

## 2020-06-08 ENCOUNTER — Telehealth: Payer: Self-pay | Admitting: Family Medicine

## 2020-06-08 NOTE — Telephone Encounter (Signed)
Left message for patient to call back and schedule Medicare Annual Wellness Visit (AWV) either virtually or in office. No detailed message left    AWV-I per [place] 10/03/17  please schedule at anytime with [facility] Nurse Health Advisor 1 or 2   This should be a 45 minute visit.

## 2020-06-23 DIAGNOSIS — G4733 Obstructive sleep apnea (adult) (pediatric): Secondary | ICD-10-CM | POA: Diagnosis not present

## 2020-07-05 ENCOUNTER — Ambulatory Visit: Payer: Medicare HMO

## 2020-07-19 DIAGNOSIS — Z9989 Dependence on other enabling machines and devices: Secondary | ICD-10-CM | POA: Diagnosis not present

## 2020-07-19 DIAGNOSIS — J309 Allergic rhinitis, unspecified: Secondary | ICD-10-CM | POA: Diagnosis not present

## 2020-07-19 DIAGNOSIS — I480 Paroxysmal atrial fibrillation: Secondary | ICD-10-CM | POA: Diagnosis not present

## 2020-07-19 DIAGNOSIS — G4733 Obstructive sleep apnea (adult) (pediatric): Secondary | ICD-10-CM | POA: Diagnosis not present

## 2020-07-21 DIAGNOSIS — G4733 Obstructive sleep apnea (adult) (pediatric): Secondary | ICD-10-CM | POA: Diagnosis not present

## 2020-07-23 DIAGNOSIS — G4733 Obstructive sleep apnea (adult) (pediatric): Secondary | ICD-10-CM | POA: Diagnosis not present

## 2020-07-26 ENCOUNTER — Other Ambulatory Visit: Payer: Self-pay

## 2020-07-26 ENCOUNTER — Encounter: Payer: Self-pay | Admitting: Family Medicine

## 2020-07-26 MED ORDER — AZITHROMYCIN 250 MG PO TABS: ORAL_TABLET | ORAL | 0 refills | Status: AC

## 2020-07-26 NOTE — Telephone Encounter (Signed)
Call in a Zpack  ?

## 2020-08-05 ENCOUNTER — Telehealth: Payer: Self-pay | Admitting: Family Medicine

## 2020-08-05 NOTE — Telephone Encounter (Signed)
Left message for patient to call back and schedule Medicare Annual Wellness Visit (AWV) either virtually or in office.    AWV-I per [place] 10/03/17  please schedule at anytime with [facility] Nurse Health Advisor 1 or 2   This should be a 45 minute visit.

## 2020-08-17 ENCOUNTER — Telehealth: Payer: Self-pay | Admitting: Family Medicine

## 2020-08-17 NOTE — Chronic Care Management (AMB) (Signed)
  Chronic Care Management   Outreach Note  08/17/2020 Name: [name] MRN: [id number] DOB: June 09, 1960  Referred by: Laurey Morale, MD Reason for referral : No chief complaint on file.   A second unsuccessful telephone outreach was attempted today. The patient was referred to pharmacist for assistance with care management and care coordination.  Follow Up Plan:   Tatjana Dellinger Upstream Scheduler

## 2020-08-23 DIAGNOSIS — G4733 Obstructive sleep apnea (adult) (pediatric): Secondary | ICD-10-CM | POA: Diagnosis not present

## 2020-08-26 ENCOUNTER — Encounter: Payer: Self-pay | Admitting: Family Medicine

## 2020-08-26 ENCOUNTER — Ambulatory Visit (INDEPENDENT_AMBULATORY_CARE_PROVIDER_SITE_OTHER): Payer: Medicare HMO | Admitting: Family Medicine

## 2020-08-26 ENCOUNTER — Other Ambulatory Visit: Payer: Self-pay

## 2020-08-26 VITALS — BP 138/82 | HR 72 | Temp 98.5°F | Wt 231.0 lb

## 2020-08-26 DIAGNOSIS — Z209 Contact with and (suspected) exposure to unspecified communicable disease: Secondary | ICD-10-CM | POA: Diagnosis not present

## 2020-08-26 DIAGNOSIS — Z1159 Encounter for screening for other viral diseases: Secondary | ICD-10-CM | POA: Diagnosis not present

## 2020-08-26 DIAGNOSIS — E291 Testicular hypofunction: Secondary | ICD-10-CM

## 2020-08-26 DIAGNOSIS — Z113 Encounter for screening for infections with a predominantly sexual mode of transmission: Secondary | ICD-10-CM | POA: Diagnosis not present

## 2020-08-26 LAB — BASIC METABOLIC PANEL
BUN: 17 mg/dL (ref 6–23)
CO2: 29 mEq/L (ref 19–32)
Calcium: 9.7 mg/dL (ref 8.4–10.5)
Chloride: 101 mEq/L (ref 96–112)
Creatinine, Ser: 0.96 mg/dL (ref 0.40–1.50)
GFR: 87.21 mL/min (ref >60.00)
Glucose, Bld: 94 mg/dL (ref 70–99)
Potassium: 5 mEq/L (ref 3.5–5.1)
Sodium: 138 mEq/L (ref 135–145)

## 2020-08-26 LAB — TESTOSTERONE: Testosterone: 542.49 ng/dL (ref 300.00–890.00)

## 2020-08-26 LAB — PSA: PSA: 0 ng/mL — ABNORMAL LOW (ref 0.10–4.00)

## 2020-08-26 NOTE — Progress Notes (Signed)
   Subjective:    Patient ID: [name], male    DOB: 1960/09/20, 60 y.o.   MRN: [id number]  HPI Here to follow up on testosterone replacement and he asks to check for STD's. He feels fine but he is worried about a recent encounter.    Review of Systems  Constitutional: Negative.   Respiratory: Negative.    Cardiovascular: Negative.       Objective:   Physical Exam Constitutional:      Appearance: Normal appearance.  Cardiovascular:     Rate and Rhythm: Normal rate and regular rhythm.     Pulses: Normal pulses.     Heart sounds: Normal heart sounds.  Pulmonary:     Effort: Pulmonary effort is normal.     Breath sounds: Normal breath sounds.  Neurological:     Mental Status: He is alert.          Assessment & Plan:  Hypogonadism, we will check levels today. We will also screen for STD's. [name], MD

## 2020-08-26 NOTE — Addendum Note (Signed)
Addended by: Trenda Moots on: 1/36/4383 10:03 AM   Modules accepted: Orders

## 2020-08-29 LAB — C. TRACHOMATIS/N. GONORRHOEAE RNA
C. trachomatis RNA, TMA: NOT DETECTED
N. gonorrhoeae RNA, TMA: NOT DETECTED

## 2020-08-29 LAB — RPR: RPR Ser Ql: NONREACTIVE

## 2020-08-29 LAB — HSV(HERPES SIMPLEX VRS) I + II AB-IGG
HAV 1 IGG,TYPE SPECIFIC AB: >58 index — ABNORMAL HIGH
HSV 2 IGG,TYPE SPECIFIC AB: <0.9 index

## 2020-08-29 LAB — HIV ANTIBODY (ROUTINE TESTING W REFLEX): HIV 1&2 Ab, 4th Generation: NONREACTIVE

## 2020-08-29 LAB — ESTRADIOL: Estradiol: <15 pg/mL (ref <=39)

## 2020-08-31 DIAGNOSIS — E785 Hyperlipidemia, unspecified: Secondary | ICD-10-CM

## 2020-09-01 ENCOUNTER — Telehealth: Payer: Self-pay | Admitting: Family Medicine

## 2020-09-01 NOTE — Chronic Care Management (AMB) (Signed)
  Chronic Care Management   Outreach Note  09/01/2020 Name: ROWE WARMAN MRN: [id number] DOB: 1961/02/06  Referred by: Laurey Morale, MD Reason for referral : No chief complaint on file.   Third unsuccessful telephone outreach was attempted today. The patient was referred to the pharmacist for assistance with care management and care coordination.   Follow Up Plan:   Tatjana Dellinger Upstream Scheduler

## 2020-09-01 NOTE — Telephone Encounter (Signed)
Sorry I did forget to order the lipid panel, so I just now put in the order. He can get this drawn when he wants to. Yes I agree with cutting the estrogen blocker back to every other day

## 2020-09-13 ENCOUNTER — Other Ambulatory Visit: Payer: Self-pay | Admitting: Family Medicine

## 2020-09-13 DIAGNOSIS — Z9989 Dependence on other enabling machines and devices: Secondary | ICD-10-CM | POA: Diagnosis not present

## 2020-09-13 DIAGNOSIS — J3089 Other allergic rhinitis: Secondary | ICD-10-CM | POA: Diagnosis not present

## 2020-09-13 DIAGNOSIS — G4710 Hypersomnia, unspecified: Secondary | ICD-10-CM | POA: Diagnosis not present

## 2020-09-13 DIAGNOSIS — G4733 Obstructive sleep apnea (adult) (pediatric): Secondary | ICD-10-CM | POA: Diagnosis not present

## 2020-09-15 ENCOUNTER — Telehealth: Payer: Self-pay | Admitting: Family Medicine

## 2020-09-15 NOTE — Telephone Encounter (Signed)
Left message for patient to call back and schedule Medicare Annual Wellness Visit (AWV) either virtually or in office.   AWV-I per [place] 10/03/17  please schedule at anytime with [facility] Nurse Health Advisor 1 or 2   This should be a 45 minute visit.

## 2020-09-16 NOTE — Telephone Encounter (Signed)
Patient isn't interested in doing an AWV.

## 2020-09-20 ENCOUNTER — Other Ambulatory Visit: Payer: Self-pay

## 2020-09-20 ENCOUNTER — Encounter: Payer: Self-pay | Admitting: Family Medicine

## 2020-09-20 ENCOUNTER — Ambulatory Visit (INDEPENDENT_AMBULATORY_CARE_PROVIDER_SITE_OTHER): Payer: Medicare HMO | Admitting: Family Medicine

## 2020-09-20 VITALS — BP 150/80 | HR 91 | Temp 97.8°F | Ht 70.0 in | Wt 230.0 lb

## 2020-09-20 DIAGNOSIS — M48061 Spinal stenosis, lumbar region without neurogenic claudication: Secondary | ICD-10-CM | POA: Diagnosis not present

## 2020-09-20 DIAGNOSIS — Z9889 Other specified postprocedural states: Secondary | ICD-10-CM

## 2020-09-20 NOTE — Progress Notes (Signed)
   Subjective:    Patient ID: [name], male    DOB: [birth year], 60 y.o.   MRN: [id number]  HPI Here for help with disability papers. He originally injured his lower back on 04-10-14 and he had surgery on 04-15-14. This involved discectomies and posterior fusions from T10-11 through l5-S1 per Dr. Jenne Campus at [facility]. He has been totally disabled since 04-10-14. He needs some long term disability papers filled out to keep his benefits active until he reaches the age of 68 years. He still has constant low back pain with bilateral leg weakness.    Review of Systems  Constitutional: Negative.   Respiratory: Negative.    Cardiovascular: Negative.   Musculoskeletal:  Positive for back pain.  Neurological:  Positive for weakness and numbness.      Objective:   Physical Exam Constitutional:      Appearance: Normal appearance.  Cardiovascular:     Rate and Rhythm: Normal rate and regular rhythm.     Pulses: Normal pulses.     Heart sounds: Normal heart sounds.  Pulmonary:     Effort: Pulmonary effort is normal.     Breath sounds: Normal breath sounds.  Neurological:     Mental Status: He is alert.          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 The paperwork was filled out. He will follow up as scheduled.  [name], MD

## 2020-09-22 DIAGNOSIS — G4733 Obstructive sleep apnea (adult) (pediatric): Secondary | ICD-10-CM | POA: Diagnosis not present

## 2020-09-26 ENCOUNTER — Encounter: Payer: Self-pay | Admitting: Family Medicine

## 2020-09-27 ENCOUNTER — Other Ambulatory Visit: Payer: Self-pay

## 2020-09-27 ENCOUNTER — Ambulatory Visit (INDEPENDENT_AMBULATORY_CARE_PROVIDER_SITE_OTHER): Payer: Medicare HMO | Admitting: Family Medicine

## 2020-09-27 ENCOUNTER — Encounter: Payer: Self-pay | Admitting: Family Medicine

## 2020-09-27 VITALS — BP 150/82 | HR 98 | Temp 98.6°F | Ht 70.0 in | Wt 233.2 lb

## 2020-09-27 DIAGNOSIS — I10 Essential (primary) hypertension: Secondary | ICD-10-CM

## 2020-09-27 MED ORDER — LISINOPRIL-HYDROCHLOROTHIAZIDE 10-12.5 MG PO TABS: 1.0000 | ORAL_TABLET | Freq: Every day | ORAL | 3 refills | Status: DC

## 2020-09-27 NOTE — Progress Notes (Signed)
   Subjective:    Patient ID: [name], male    DOB: 11-10-1960, 60 y.o.   MRN: [id number]  HPI Here for elevated BP readings for the past 2 months. The systolic readings are usually in the 150s but may go up to the 160s. The diastolic remains in the [id number]. He feels well. He is still working out regularly and watching his diet. He avoids salt most of the time. His recent creatinine was normal.   Review of Systems  Constitutional: Negative.   Respiratory: Negative.    Cardiovascular: Negative.   Neurological: Negative.       Objective:   Physical Exam Constitutional:      Appearance: Normal appearance.  Cardiovascular:     Rate and Rhythm: Normal rate and regular rhythm.     Pulses: Normal pulses.     Heart sounds: Normal heart sounds.  Pulmonary:     Effort: Pulmonary effort is normal.     Breath sounds: Normal breath sounds.  Musculoskeletal:     Right lower leg: No edema.     Left lower leg: No edema.  Neurological:     General: No focal deficit present.     Mental Status: He is alert and oriented to person, place, and time.          Assessment & Plan:  HTN, we will treat with Lisinopril HCT 10-12.5 daily. Recheck in 3-4 weeks.  [name], MD

## 2020-09-28 NOTE — Telephone Encounter (Signed)
Yes he should continue to take the medication. His body should adjust to it over the next few weeks

## 2020-09-29 DIAGNOSIS — M25551 Pain in right hip: Secondary | ICD-10-CM | POA: Diagnosis not present

## 2020-09-29 DIAGNOSIS — M25552 Pain in left hip: Secondary | ICD-10-CM | POA: Diagnosis not present

## 2020-09-30 DIAGNOSIS — Z4789 Encounter for other orthopedic aftercare: Secondary | ICD-10-CM | POA: Diagnosis not present

## 2020-09-30 DIAGNOSIS — Z981 Arthrodesis status: Secondary | ICD-10-CM | POA: Diagnosis not present

## 2020-10-13 ENCOUNTER — Telehealth: Payer: Self-pay | Admitting: Family Medicine

## 2020-10-13 NOTE — Telephone Encounter (Signed)
Left message for patient to call back and schedule Medicare Annual Wellness Visit (AWV) either virtually or in office.   Left both  my jabber number [phone] and office number    AWV-I per [place] 10/03/17   please schedule at anytime with [facility] Nurse Health Advisor 1 or 2   This should be a 45 minute visit.

## 2020-10-20 DIAGNOSIS — R0902 Hypoxemia: Secondary | ICD-10-CM | POA: Diagnosis not present

## 2020-10-20 DIAGNOSIS — R000 Tachycardia, unspecified: Secondary | ICD-10-CM | POA: Diagnosis not present

## 2020-10-20 DIAGNOSIS — I499 Cardiac arrhythmia, unspecified: Secondary | ICD-10-CM | POA: Diagnosis not present

## 2020-10-21 ENCOUNTER — Emergency Department (HOSPITAL_COMMUNITY)
Admission: EM | Admit: 2020-10-21 | Discharge: 2020-10-21 | Disposition: A | Discharge status: Home / Self Care | Payer: Medicare HMO | Attending: Emergency Medicine | Admitting: Emergency Medicine

## 2020-10-21 ENCOUNTER — Encounter (HOSPITAL_COMMUNITY): Payer: Self-pay | Admitting: Emergency Medicine

## 2020-10-21 ENCOUNTER — Other Ambulatory Visit: Payer: Self-pay

## 2020-10-21 DIAGNOSIS — Z8616 Personal history of COVID-19: Secondary | ICD-10-CM | POA: Diagnosis not present

## 2020-10-21 DIAGNOSIS — Z79899 Other long term (current) drug therapy: Secondary | ICD-10-CM | POA: Diagnosis not present

## 2020-10-21 DIAGNOSIS — I10 Essential (primary) hypertension: Secondary | ICD-10-CM | POA: Diagnosis not present

## 2020-10-21 DIAGNOSIS — I4891 Unspecified atrial fibrillation: Secondary | ICD-10-CM | POA: Diagnosis not present

## 2020-10-21 DIAGNOSIS — Z8546 Personal history of malignant neoplasm of prostate: Secondary | ICD-10-CM | POA: Insufficient documentation

## 2020-10-21 DIAGNOSIS — Z7982 Long term (current) use of aspirin: Secondary | ICD-10-CM | POA: Insufficient documentation

## 2020-10-21 DIAGNOSIS — Z96643 Presence of artificial hip joint, bilateral: Secondary | ICD-10-CM | POA: Insufficient documentation

## 2020-10-21 DIAGNOSIS — Z7901 Long term (current) use of anticoagulants: Secondary | ICD-10-CM | POA: Diagnosis not present

## 2020-10-21 LAB — CBC WITH DIFFERENTIAL/PLATELET
Abs Immature Granulocytes: 0.02 10*3/uL (ref 0.00–0.07)
Basophils Absolute: 0.1 10*3/uL (ref 0.0–0.1)
Basophils Relative: 1 %
Eosinophils Absolute: 0.1 10*3/uL (ref 0.0–0.5)
Eosinophils Relative: 1 %
HCT: 48.1 % (ref 39.0–52.0)
Hemoglobin: 15.5 g/dL (ref 13.0–17.0)
Immature Granulocytes: 0 %
Lymphocytes Relative: 16 %
Lymphs Abs: 1.3 10*3/uL (ref 0.7–4.0)
MCH: 24 pg — ABNORMAL LOW (ref 26.0–34.0)
MCHC: 32.2 g/dL (ref 30.0–36.0)
MCV: 74.5 fL — ABNORMAL LOW (ref 80.0–100.0)
Monocytes Absolute: 1.2 10*3/uL — ABNORMAL HIGH (ref 0.1–1.0)
Monocytes Relative: 15 %
Neutro Abs: 5.3 10*3/uL (ref 1.7–7.7)
Neutrophils Relative %: 67 %
Platelets: 333 10*3/uL (ref 150–400)
RBC: 6.46 MIL/uL — ABNORMAL HIGH (ref 4.22–5.81)
RDW: 20.8 % — ABNORMAL HIGH (ref 11.5–15.5)
WBC: 7.9 10*3/uL (ref 4.0–10.5)
nRBC: 0 % (ref 0.0–0.2)

## 2020-10-21 LAB — BASIC METABOLIC PANEL
Anion gap: 8 (ref 5–15)
BUN: 20 mg/dL (ref 6–20)
CO2: 28 mmol/L (ref 22–32)
Calcium: 9.7 mg/dL (ref 8.9–10.3)
Chloride: 97 mmol/L — ABNORMAL LOW (ref 98–111)
Creatinine, Ser: 1.18 mg/dL (ref 0.61–1.24)
GFR, Estimated: >60 mL/min (ref >60)
Glucose, Bld: 91 mg/dL (ref 70–99)
Potassium: 4.7 mmol/L (ref 3.5–5.1)
Sodium: 133 mmol/L — ABNORMAL LOW (ref 135–145)

## 2020-10-21 LAB — MAGNESIUM: Magnesium: 2.1 mg/dL (ref 1.7–2.4)

## 2020-10-21 MED ORDER — APIXABAN 5 MG PO TABS
5.0000 mg | ORAL_TABLET | Freq: Two times a day (BID) | ORAL | Status: DC
  Administered 2020-10-21: 5 mg via ORAL
  Filled 2020-10-21: qty 1

## 2020-10-21 MED ORDER — PROPOFOL 10 MG/ML IV BOLUS
0.5000 mg/kg | Freq: Once | INTRAVENOUS | Status: AC
  Administered 2020-10-21: 50 mg via INTRAVENOUS
  Filled 2020-10-21: qty 20

## 2020-10-21 MED ORDER — METOPROLOL TARTRATE 5 MG/5ML IV SOLN
5.0000 mg | Freq: Once | INTRAVENOUS | Status: AC
  Administered 2020-10-21: 5 mg via INTRAVENOUS
  Filled 2020-10-21: qty 5

## 2020-10-21 MED ORDER — METOPROLOL TARTRATE 5 MG/5ML IV SOLN
5.0000 mg | Freq: Once | INTRAVENOUS | Status: DC
  Filled 2020-10-21: qty 5

## 2020-10-21 MED ORDER — APIXABAN 5 MG PO TABS: 5.0000 mg | ORAL_TABLET | Freq: Two times a day (BID) | ORAL | 0 refills | Status: DC

## 2020-10-21 MED ORDER — SODIUM CHLORIDE 0.9 % IV BOLUS
1000.0000 mL | Freq: Once | INTRAVENOUS | Status: AC
  Administered 2020-10-21: 1000 mL via INTRAVENOUS

## 2020-10-21 MED ORDER — PROPOFOL 10 MG/ML IV BOLUS
INTRAVENOUS | Status: DC | PRN
  Administered 2020-10-21: 20 mg via INTRAVENOUS
  Administered 2020-10-21: 15 mg via INTRAVENOUS

## 2020-10-21 NOTE — Sedation Documentation (Signed)
Pt shocked at 200J

## 2020-10-21 NOTE — ED Triage Notes (Signed)
Pt BIB [facility] from home, pt reports feeling palpitations and shortness of breath tonight. Found to be in afib rvr by EMS, initial HR 160-180. Pt has hx afib and has cardizem '30mg'$  PRN, given PO dose by EMS. Placed on [facility] and given 500ccNS pta.

## 2020-10-21 NOTE — Discharge Instructions (Addendum)
Take the blood thinner as prescribed.  I would stop your aspirin until your cardiologist tells you otherwise.  Follow-up with the cardiologist in the office.  Usually they want you to call the A. fib clinic to set up appointment because they can see you sooner than your primary cardiologist usually can.

## 2020-10-21 NOTE — ED Provider Notes (Signed)
[facility] EMERGENCY DEPARTMENT Provider Note   CSN: [id number] Arrival date & time: 10/21/20  0029     History Chief Complaint  Patient presents with   Atrial Fibrillation    [name] is a 60 y.o. male.  [name] with a chief complaints of atrial fibrillation.  Patient has a history of paroxysmal A. fib was in his normal state of health and then noticed that suddenly when he tried to sit up that his heart rate was irregular and faster than normal.  Called 911 and noted to be in A. fib with RVR.  Not on AV nodal blocking agents at home.  Not on anticoagulation.  Denies recent illness denies cough congestion or fever denies decreased oral intake.  Was recently started on a combination lisinopril HCTZ within the past couple weeks.  The history is provided by the patient.  Atrial Fibrillation This is a new problem. The current episode started less than 1 hour ago. The problem occurs constantly. The problem has not changed since onset.Pertinent negatives include no chest pain, no abdominal pain, no headaches and no shortness of breath. Nothing aggravates the symptoms. Nothing relieves the symptoms. He has tried nothing for the symptoms. The treatment provided no relief.      Past Medical History:  Diagnosis Date   Arthritis    Cancer [facility])    prostate, sees Dr. Risa Grill    Colon polyps    Degenerative disc disease, cervical    Degenerative disc disease, lumbar    Depression    GERD (gastroesophageal reflux disease)    Low back pain    Osteopenia     Patient Active Problem List   Diagnosis Date Noted   COVID-19 virus infection 04/18/2020   Pain of left hip joint 03/30/2020   Chronic cough 09/30/2019   Atrial fibrillation with RVR ([place]) 09/08/2019   Chronic narcotic use 02/08/2017   Bilateral leg weakness 05/24/2016   HTN (hypertension) 02/04/2015   Other reduced mobility 04/21/2014   Left foot drop 04/12/2014   H/O arthrodesis 10/19/2013   Cervical  osteoarthritis 09/23/2013   Status post lumbar spine operation 12/09/2012   History of cervical spinal surgery 12/09/2012   Lumbar canal stenosis 08/15/2012   Degeneration of intervertebral disc of lumbosacral region 08/15/2012   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 Harl Bowie    COLONOSCOPY  03/26/2018    per Dr. Alessandra Bevels, adenomatous polyps, repeat in 3 yrs    CYSTECTOMY     benign, right hip   GYNECOMASTIA EXCISION     bilateral, reduction   JOINT REPLACEMENT  12-20-11   hx. LTHA, now RTHA planned   LAMINECTOMY  2/09   lumbar   LUMBAR FUSION  2015   L4 through S1, per Dr. Harl Bowie    LUMBAR MICRODISCECTOMY  10/15/08   L-4-5, per Dr. Jenne Campus at [place]  12-13-10   robotic prostatectomy per Dr. Risa Grill  TOTAL HIP ARTHROPLASTY  12-20-11   2010 left hip   TOTAL HIP ARTHROPLASTY  12/26/2011   Procedure: TOTAL HIP ARTHROPLASTY;  Surgeon: Gearlean Alf, MD;  Location: [facility] ORS;  Service: Orthopedics;  Laterality: Right;       Family History  Problem Relation Age of Onset   Hypertension Father    Suicidality Father    Healthy Mother    Arthritis Other    Cancer Brother        Bone cancer    Social History   Tobacco Use   Smoking status: Never   Smokeless tobacco: Never  Substance Use Topics   Alcohol use: No    Alcohol/week: 0.0 standard drinks    Comment: rare   Drug use: No    Home Medications Prior to Admission  medications   Medication Sig Start Date End Date Taking? Authorizing Provider  anastrozole (ARIMIDEX) 1 MG tablet Take 1 tablet (1 mg total) by mouth daily. Patient taking differently: Take 1 mg by mouth every evening. 03/16/20  Yes Laurey Morale, MD  apixaban (ELIQUIS) 5 MG TABS tablet Take 1 tablet (5 mg total) by mouth 2 (two) times daily. 10/21/20 11/20/20 Yes Deno Etienne, DO  aspirin 81 MG tablet Take 81 mg by mouth daily.   Yes [provider]  Cholecalciferol (VITAMIN D) 125 MCG (5000 UT) CAPS Take 5,000 Units by mouth daily.   Yes [provider]  diclofenac (VOLTAREN) 75 MG EC tablet Take 1 tablet (75 mg total) by mouth 2 (two) times daily. 05/17/20  Yes Laurey Morale, MD  lisinopril-hydrochlorothiazide (ZESTORETIC) 10-12.5 MG tablet Take 1 tablet by mouth daily. Patient taking differently: Take 1 tablet by mouth every evening. 09/27/20  Yes Laurey Morale, MD  testosterone cypionate (DEPOTESTOSTERONE CYPIONATE) 200 MG/ML injection INJECT 1 ML (200 MG TOTAL) INTO THE MUSCLE EVERY 7 (SEVEN) DAYS. Patient taking differently: Inject 200 mg into the muscle every Friday. 09/13/20  Yes Laurey Morale, MD  traMADol (ULTRAM) 50 MG tablet Take 2 tablets (100 mg total) by mouth every 6 (six) hours as needed. for pain Patient taking differently: Take 100 mg by mouth every 6 (six) hours as needed for moderate pain. 03/08/20  Yes Laurey Morale, MD    Allergies    Flexeril [cyclobenzaprine], Oxycodone-acetaminophen, and Oxycodone-acetaminophen  Review of Systems   Review of Systems  Constitutional:  Negative for chills and fever.  HENT:  Negative for congestion and facial swelling.   Eyes:  Negative for discharge and visual disturbance.  Respiratory:  Negative for shortness of breath.   Cardiovascular:  Positive for palpitations. Negative for chest pain.  Gastrointestinal:  Negative for abdominal pain, diarrhea and vomiting.  Musculoskeletal:  Negative for arthralgias and myalgias.   Skin:  Negative for color change and rash.  Neurological:  Negative for tremors, syncope and headaches.  Psychiatric/Behavioral:  Negative for confusion and dysphoric mood.    Physical Exam Updated Vital Signs BP 101/68   Pulse 70   Temp 98.4 F (36.9 C) (Oral)   Resp 18   Wt 104 kg   SpO2 100%   BMI 32.90 kg/m   Physical Exam Vitals and nursing note reviewed.  Constitutional:      Appearance: He is well-developed.  HENT:     Head: Normocephalic and atraumatic.  Eyes:     Pupils: Pupils are equal, round, and reactive to light.  Neck:     Vascular: No JVD.  Cardiovascular:  Rate and Rhythm: Tachycardia present. Rhythm irregular.     Heart sounds: No murmur heard.   No friction rub. No gallop.  Pulmonary:     Effort: No respiratory distress.     Breath sounds: No wheezing.  Abdominal:     General: There is no distension.     Tenderness: There is no abdominal tenderness. There is no guarding or rebound.  Musculoskeletal:        General: Normal range of motion.     Cervical back: Normal range of motion and neck supple.  Skin:    Coloration: Skin is not pale.     Findings: No rash.  Neurological:     Mental Status: He is alert and oriented to person, place, and time.  Psychiatric:        Behavior: Behavior normal.    ED Results / Procedures / Treatments   Labs (all labs ordered are listed, but only abnormal results are displayed) Labs Reviewed  CBC WITH DIFFERENTIAL/PLATELET - Abnormal; Notable for the following components:      Result Value   RBC 6.46 (*)    MCV 74.5 (*)    MCH 24.0 (*)    RDW 20.8 (*)    Monocytes Absolute 1.2 (*)    All other components within normal limits  BASIC METABOLIC PANEL - Abnormal; Notable for the following components:   Sodium 133 (*)    Chloride 97 (*)    All other components within normal limits  MAGNESIUM    EKG EKG Interpretation  Date/Time:  Friday October 21 2020 00:34:51 EDT Ventricular Rate:  159 PR  Interval:    QRS Duration: 113 QT Interval:  291 QTC Calculation: 474 R Axis:   5 Text Interpretation: Atrial fibrillation Incomplete left bundle branch block Otherwise no significant change Confirmed by Deno Etienne [phone]) on 10/21/2020 12:38:03 AM  Radiology No results found.  Procedures .Cardioversion  Date/Time: 10/21/2020 4:49 AM Performed by: Deno Etienne, DO Authorized by: Deno Etienne, DO   Consent:    Consent obtained:  Verbal   Consent given by:  Patient   Risks discussed:  Cutaneous burn, death and induced arrhythmia   Alternatives discussed:  No treatment, rate-control medication, anti-coagulation medication and delayed treatment Pre-procedure details:    Cardioversion basis:  Elective   Rhythm:  Atrial fibrillation   Electrode placement:  Anterior-lateral Patient sedated: Yes. Refer to sedation procedure documentation for details of sedation.  Attempt one:    Cardioversion mode:  Synchronous   Waveform:  Biphasic   Shock (Joules):  200   Shock outcome:  Conversion to normal sinus rhythm Post-procedure details:    Patient status:  Awake   Patient tolerance of procedure:  Tolerated well, no immediate complications .Sedation  Date/Time: 10/21/2020 4:50 AM Performed by: Deno Etienne, DO Authorized by: Deno Etienne, DO   Consent:    Consent obtained:  Verbal   Consent given by:  Patient   Risks discussed:  Allergic reaction, dysrhythmia, prolonged hypoxia resulting in organ damage, prolonged sedation necessitating reversal, inadequate sedation and respiratory compromise necessitating ventilatory assistance and intubation   Alternatives discussed:  Analgesia without sedation Universal protocol:    Procedure explained and questions answered to patient or proxy's satisfaction: yes     Required blood products, implants, devices, and special equipment available: yes     Immediately prior to procedure, a time out was called: yes     Patient identity confirmed:  Verbally with  patient Indications:    Procedure performed:  Cardioversion   Procedure necessitating sedation performed by:  Physician performing sedation Pre-sedation assessment:    Time since last food or drink:  6   ASA classification: class 1 - normal, healthy patient     Mouth opening:  2 finger widths   Thyromental distance:  3 finger widths   Mallampati score:  II - soft palate, uvula, fauces visible   Neck mobility: normal     Pre-sedation assessments completed and reviewed: airway patency, cardiovascular function, hydration status, mental status, nausea/vomiting, pain level, respiratory function and temperature   Immediate pre-procedure details:    Reassessment: Patient reassessed immediately prior to procedure     Reviewed: vital signs     Verified: bag valve mask available, emergency equipment available, intubation equipment available, IV patency confirmed and oxygen available   Procedure details (see MAR for exact dosages):    Preoxygenation:  Nasal cannula   Sedation:  Propofol   Intended level of sedation: moderate (conscious sedation)   Analgesia:  None   Intra-procedure monitoring:  Blood pressure monitoring, cardiac monitor, continuous pulse oximetry, frequent LOC assessments and frequent vital sign checks   Intra-procedure events: none     Total Provider sedation time (minutes):  35 Post-procedure details:    Attendance: Constant attendance by certified staff until patient recovered     Recovery: Patient returned to pre-procedure baseline     Post-sedation assessments completed and reviewed: airway patency, cardiovascular function, hydration status, mental status, nausea/vomiting, pain level and temperature     Patient is stable for discharge or admission: yes     Procedure completion:  Tolerated well, no immediate complications   Medications Ordered in ED Medications  metoprolol tartrate (LOPRESSOR) injection 5 mg (5 mg Intravenous Not Given 10/21/20 0333)  propofol (DIPRIVAN) 10  mg/mL bolus/IV push (15 mg Intravenous Given 10/21/20 0400)  apixaban (ELIQUIS) tablet 5 mg (has no administration in time range)  sodium chloride 0.9 % bolus 1,000 mL (0 mLs Intravenous Stopped 10/21/20 0330)  metoprolol tartrate (LOPRESSOR) injection 5 mg (5 mg Intravenous Given 10/21/20 0055)  propofol (DIPRIVAN) 10 mg/mL bolus/IV push 52 mg (50 mg Intravenous Given 10/21/20 0357)    ED Course  I have reviewed the triage vital signs and the nursing notes.  Pertinent labs & imaging results that were available during my care of the patient were reviewed by me and considered in my medical decision making (see chart for details).    MDM Rules/Calculators/A&P                           60 yo M with a chief complaint of atrial fibrillation with RVR.  This started abruptly just about an hour ago.  I discussed with him electrical cardioversion which he is considering.  We will assess for electrolyte abnormalities after recently being started on HCTZ.  Bolus of IV fluids.  Dose of metoprolol.  Reassess.  No significant electrolyte abnormality the patient has very mild hyponatremia.  I doubt this is the cause of his arrhythmia.  Electrically cardioverted at bedside.  Started on Eliquis per protocol.  A. fib clinic follow-up.  CRITICAL CARE Performed by: Cecilio Asper   Total critical care time: 35 minutes  Critical care time was exclusive of separately billable procedures and treating other patients.  Critical care was necessary to treat or prevent imminent or life-threatening deterioration.  Critical care was time spent personally by me on the following activities: development of treatment plan with  patient and/or surrogate as well as nursing, discussions with consultants, evaluation of patient's response to treatment, examination of patient, obtaining history from patient or surrogate, ordering and performing treatments and interventions, ordering and review of laboratory studies,  ordering and review of radiographic studies, pulse oximetry and re-evaluation of patient's condition.  CHA2DS2/VAS Stroke Risk Points  Current as of 4 minutes ago     1 >= 2 Points: High Risk  1 - 1.99 Points: Medium Risk  0 Points: Low Risk    Last Change: N/A      Details    This score determines the patient's risk of having a stroke if the  patient has atrial fibrillation.       Points Metrics  0 Has Congestive Heart Failure:  No    Current as of 4 minutes ago  0 Has Vascular Disease:  No    Current as of 4 minutes ago  1 Has Hypertension:  Yes    Current as of 4 minutes ago  0 Age:  51    Current as of 4 minutes ago  0 Has Diabetes:  No    Current as of 4 minutes ago  0 Had Stroke:  No  Had TIA:  No  Had Thromboembolism:  No    Current as of 4 minutes ago  0 Male:  No    Current as of 4 minutes ago          4:52 AM:  I have discussed the diagnosis/risks/treatment options with the patient and believe the pt to be eligible for discharge home to follow-up with Cards. We also discussed returning to the ED immediately if new or worsening sx occur. We discussed the sx which are most concerning (e.g., sudden worsening pain, fever, inability to tolerate by mouth, recurrent event) that necessitate immediate return. Medications administered to the patient during their visit and any new prescriptions provided to the patient are listed below.  Medications given during this visit Medications  metoprolol tartrate (LOPRESSOR) injection 5 mg (5 mg Intravenous Not Given 10/21/20 0333)  propofol (DIPRIVAN) 10 mg/mL bolus/IV push (15 mg Intravenous Given 10/21/20 0400)  apixaban (ELIQUIS) tablet 5 mg (has no administration in time range)  sodium chloride 0.9 % bolus 1,000 mL (0 mLs Intravenous Stopped 10/21/20 0330)  metoprolol tartrate (LOPRESSOR) injection 5 mg (5 mg Intravenous Given 10/21/20 0055)  propofol (DIPRIVAN) 10 mg/mL bolus/IV push 52 mg (50 mg Intravenous Given 10/21/20 0357)      The patient appears reasonably screen and/or stabilized for discharge and I doubt any other medical condition or other [facility] requiring further screening, evaluation, or treatment in the ED at this time prior to discharge.    Final Clinical Impression(s) / ED Diagnoses Final diagnoses:  Atrial fibrillation with RVR ([place])    Rx / DC Orders ED Discharge Orders          Ordered    apixaban (ELIQUIS) 5 MG TABS tablet  2 times daily        10/21/20 Martin, [place], DO 10/21/20 [phone]

## 2020-10-21 NOTE — ED Notes (Signed)
Pt has called a ride home

## 2020-10-23 DIAGNOSIS — G4733 Obstructive sleep apnea (adult) (pediatric): Secondary | ICD-10-CM | POA: Diagnosis not present

## 2020-10-26 ENCOUNTER — Ambulatory Visit (HOSPITAL_COMMUNITY)
Admission: RE | Admit: 2020-10-26 | Discharge: 2020-10-26 | Disposition: A | Discharge status: Home / Self Care | Payer: Medicare HMO | Source: Ambulatory Visit | Attending: Physician Assistant | Admitting: Physician Assistant

## 2020-10-26 ENCOUNTER — Encounter (HOSPITAL_COMMUNITY): Payer: Self-pay | Admitting: Physician Assistant

## 2020-10-26 ENCOUNTER — Other Ambulatory Visit: Payer: Self-pay

## 2020-10-26 VITALS — BP 140/72 | HR 85 | Ht 70.0 in | Wt 231.0 lb

## 2020-10-26 DIAGNOSIS — Z6833 Body mass index (BMI) 33.0-33.9, adult: Secondary | ICD-10-CM | POA: Insufficient documentation

## 2020-10-26 DIAGNOSIS — Z79899 Other long term (current) drug therapy: Secondary | ICD-10-CM | POA: Diagnosis not present

## 2020-10-26 DIAGNOSIS — Z885 Allergy status to narcotic agent status: Secondary | ICD-10-CM | POA: Insufficient documentation

## 2020-10-26 DIAGNOSIS — I10 Essential (primary) hypertension: Secondary | ICD-10-CM | POA: Diagnosis not present

## 2020-10-26 DIAGNOSIS — E669 Obesity, unspecified: Secondary | ICD-10-CM | POA: Diagnosis not present

## 2020-10-26 DIAGNOSIS — Z9989 Dependence on other enabling machines and devices: Secondary | ICD-10-CM | POA: Diagnosis not present

## 2020-10-26 DIAGNOSIS — Z981 Arthrodesis status: Secondary | ICD-10-CM | POA: Insufficient documentation

## 2020-10-26 DIAGNOSIS — G4733 Obstructive sleep apnea (adult) (pediatric): Secondary | ICD-10-CM | POA: Insufficient documentation

## 2020-10-26 DIAGNOSIS — Z7901 Long term (current) use of anticoagulants: Secondary | ICD-10-CM | POA: Insufficient documentation

## 2020-10-26 DIAGNOSIS — I480 Paroxysmal atrial fibrillation: Secondary | ICD-10-CM | POA: Insufficient documentation

## 2020-10-26 DIAGNOSIS — Z7182 Exercise counseling: Secondary | ICD-10-CM | POA: Insufficient documentation

## 2020-10-26 DIAGNOSIS — Z96643 Presence of artificial hip joint, bilateral: Secondary | ICD-10-CM | POA: Diagnosis not present

## 2020-10-26 MED ORDER — DILTIAZEM HCL ER COATED BEADS 120 MG PO CP24: 120.0000 mg | ORAL_CAPSULE | Freq: Every day | ORAL | 1 refills | Status: DC

## 2020-10-26 NOTE — Progress Notes (Signed)
Primary Care Physician: Laurey Morale, MD Primary Cardiologist: Dr Acie Fredrickson Primary Electrophysiologist: none Referring Physician: [facility]   [name] is a 60 y.o. male with a history of OSA, atrial fibrillation who presents for consultation in the [facility]. The patient was initially diagnosed with atrial fibrillation 09/2019 after presenting to the ED with URI symptoms and was noted to have an elevated heart rate. He converted to SR with diltiazem drip. He was in his usual state of health until 10/21/20 when he had sudden onset of tachypalpitations. He presented to the ED and underwent DCCV. Patient is on Eliquis for a CHADS2VASC score of 1. He does report that he did not sleep well the night before with his CPAP. Of note, he is scheduled for a procedure on his right hip in two weeks.   Today, he denies symptoms of palpitations, chest pain, shortness of breath, orthopnea, PND, lower extremity edema, dizziness, presyncope, syncope, snoring, daytime somnolence, bleeding, or neurologic sequela. The patient is tolerating medications without difficulties and is otherwise without complaint today.    Atrial Fibrillation Risk Factors:  he does have symptoms or diagnosis of sleep apnea. he is compliant with CPAP therapy. he does not have a history of rheumatic fever. he does not have a history of alcohol use. The patient does not have a history of early familial atrial fibrillation or other arrhythmias.  he has a BMI of Body mass index is 33.15 kg/m.Marland Kitchen Filed Weights   10/26/20 0937  Weight: 104.8 kg    Family History  Problem Relation Age of Onset   Hypertension Father    Suicidality Father    Healthy Mother    Arthritis Other    Cancer Brother        Bone cancer     Atrial Fibrillation Management history:  Previous antiarrhythmic drugs: none Previous cardioversions: 10/21/20 Previous ablations: none CHADS2VASC score: 1 Anticoagulation  history: Eliquis   Past Medical History:  Diagnosis Date   Arthritis    Cancer ([place])    prostate, sees Dr. Risa Grill    Colon polyps    Degenerative disc disease, cervical    Degenerative disc disease, lumbar    Depression    GERD (gastroesophageal reflux disease)    Low back pain    Osteopenia    Past Surgical History:  Procedure Laterality Date   CERVICAL FUSION  2015   C3 through C7, per Dr. Harl Bowie    COLONOSCOPY  03/26/2018    per Dr. Alessandra Bevels, adenomatous polyps, repeat in 3 yrs    CYSTECTOMY     benign, right hip   GYNECOMASTIA EXCISION     bilateral, reduction   JOINT REPLACEMENT  12-20-11   hx. LTHA, now RTHA planned   LAMINECTOMY  2/09   lumbar   LUMBAR FUSION  2015   L4 through S1, per Dr. Harl Bowie    LUMBAR MICRODISCECTOMY  10/15/08   L-4-5, per Dr. Jenne Campus at [facility]  12-13-10   robotic prostatectomy per Dr. Risa Grill    TOTAL HIP ARTHROPLASTY  12-20-11   2010 left hip   TOTAL HIP ARTHROPLASTY  12/26/2011   Procedure: TOTAL HIP ARTHROPLASTY;  Surgeon: Gearlean Alf, MD;  Location: [facility];  Service: Orthopedics;  Laterality: Right;    Current Outpatient Medications  Medication Sig Dispense Refill   anastrozole (ARIMIDEX) 1 MG tablet Take 1 tablet (1 mg total) by mouth daily. 90 tablet 3  apixaban (ELIQUIS) 5 MG TABS tablet Take 1 tablet (5 mg total) by mouth 2 (two) times daily. 60 tablet 0   Cholecalciferol (VITAMIN D) 125 MCG (5000 UT) CAPS Take 5,000 Units by mouth daily.     diclofenac (VOLTAREN) 75 MG EC tablet Take 1 tablet (75 mg total) by mouth 2 (two) times daily. 180 tablet 3   diltiazem (CARDIZEM CD) 120 MG 24 hr capsule Take 1 capsule (120 mg total) by mouth daily. 30 capsule 1   lisinopril-hydrochlorothiazide (ZESTORETIC) 10-12.5 MG tablet Take 1 tablet by mouth daily. 30 tablet 3   testosterone cypionate (DEPOTESTOSTERONE CYPIONATE) 200 MG/ML injection INJECT 1 ML (200 MG TOTAL) INTO THE MUSCLE EVERY 7  (SEVEN) DAYS. 10 mL 1   traMADol (ULTRAM) 50 MG tablet Take 2 tablets (100 mg total) by mouth every 6 (six) hours as needed. for pain 720 tablet 1   No current facility-administered medications for this encounter.    Allergies  Allergen Reactions   Flexeril [Cyclobenzaprine]     Weird feeling   Oxycodone-Acetaminophen Nausea Only    dizziness   Oxycodone-Acetaminophen Nausea Only and Other (See Comments)    dizziness Weird feeling;  Hydrocodone on home med list 7/24 dizziness    Social History   Socioeconomic History   Marital status: Divorced    Spouse name: Not on file   Number of children: Not on file   Years of education: Not on file   Highest education level: Not on file  Occupational History   Not on file  Tobacco Use   Smoking status: Never   Smokeless tobacco: Never  Substance and Sexual Activity   Alcohol use: No    Alcohol/week: 0.0 standard drinks    Comment: rare   Drug use: No   Sexual activity: Yes  Other Topics Concern   Not on file  Social History Narrative   Lives alone in a one story home.  No children.     On disability since September 2016.  He was previously working in Engineer, technical sales.    Education: high school.   Social Determinants of Health   Financial Resource Strain: Not on file  Food Insecurity: Not on file  Transportation Needs: Not on file  Physical Activity: Not on file  Stress: Not on file  Social Connections: Not on file  Intimate Partner Violence: Not on file     ROS- All systems are reviewed and negative except as per the HPI above.  Physical Exam: Vitals:   10/26/20 0937  BP: 140/72  Pulse: 85  Weight: 104.8 kg  Height: '5\' 10"'$  (1.778 m)    GEN- The patient is a well appearing obese male, alert and oriented x 3 today.   Head- normocephalic, atraumatic Eyes-  Sclera clear, conjunctiva pink Ears- hearing intact Oropharynx- clear Neck- supple  Lungs- Clear to ausculation bilaterally, normal work of breathing Heart- Regular  rate and rhythm, no murmurs, rubs or gallops  GI- soft, NT, ND, + BS Extremities- no clubbing, cyanosis, or edema MS- no significant deformity or atrophy Skin- no rash or lesion Psych- euthymic mood, full affect Neuro- strength and sensation are intact  Wt Readings from Last 3 Encounters:  10/26/20 104.8 kg  10/21/20 104 kg  09/27/20 105.8 kg    EKG today demonstrates  SR, LVH Vent. rate 85 BPM PR interval 148 ms QRS duration 106 ms QT/QTcB 360/428 ms  Echo 10/05/19 demonstrated  1. Left ventricular ejection fraction, by estimation, is 55%. The left  ventricle  has normal function. The left ventricle has no regional wall  motion abnormalities. There is mild left ventricular hypertrophy. Left  ventricular diastolic parameters are consistent with Grade I diastolic dysfunction (impaired relaxation).   2. Right ventricular systolic function is normal. The right ventricular  size is normal. Tricuspid regurgitation signal is inadequate for assessing PA pressure.   3. The mitral valve is normal in structure. No evidence of mitral valve  regurgitation. No evidence of mitral stenosis.   4. The aortic valve is tricuspid. Aortic valve regurgitation is not  visualized. No aortic stenosis is present.   5. Aortic dilatation noted. There is mild dilatation of the ascending  aorta measuring 39 mm.   6. The inferior vena cava is normal in size with greater than 50%  respiratory variability, suggesting right atrial pressure of 3 mmHg.   Epic records are reviewed at length today  CHA2DS2-VASc Score = 1  The patient's score is based upon: CHF History: No HTN History: Yes Diabetes History: No Stroke History: No Vascular Disease History: No Age Score: 0 Gender Score: 0      ASSESSMENT AND PLAN: 1. Paroxysmal Atrial Fibrillation (ICD10:  I48.0) The patient's CHA2DS2-VASc score is 1, indicating a 0.6% annual risk of stroke.   General education about afib provided and questions answered. We  also discussed his stroke risk and the risks and benefits of anticoagulation. We discussed therapeutic options today.  Will start diltiazem 120 mg daily. Continue Eliquis 5 mg BID for at least 4 weeks post DCCV. Would not be able to stop until 11/18/20. Patient interested in ablation now that he has had two ED visits for rapid afib. I do think he would be a good candidate. He would like to pursue his hip procedure first.   2. Obesity Body mass index is 33.15 kg/m. Lifestyle modification was discussed at length including regular exercise and weight reduction.  3. Obstructive sleep apnea The importance of adequate treatment of sleep apnea was discussed today in order to improve our ability to maintain sinus rhythm long term. Patient reports compliance with CPAP therapy.  4. HTN Stable, med changes as above.   Follow up with Dr Acie Fredrickson as scheduled. Can consider referral to EP when/if he is ready for ablation evaluation.    [facility] [address] [place], [place] [address] [phone] 10/26/2020 1:09 PM

## 2020-10-26 NOTE — Patient Instructions (Signed)
Start cardizem '120mg'$  once a day    On September 16th you can stop Eliquis.    Follow up with Dr. Acie Fredrickson as scheduled.

## 2020-11-08 ENCOUNTER — Other Ambulatory Visit: Payer: Self-pay

## 2020-11-08 DIAGNOSIS — D2121 Benign neoplasm of connective and other soft tissue of right lower limb, including hip: Secondary | ICD-10-CM | POA: Diagnosis not present

## 2020-11-08 DIAGNOSIS — M7989 Other specified soft tissue disorders: Secondary | ICD-10-CM | POA: Diagnosis not present

## 2020-11-17 ENCOUNTER — Other Ambulatory Visit (HOSPITAL_COMMUNITY): Payer: Self-pay | Admitting: Physician Assistant

## 2020-11-18 ENCOUNTER — Other Ambulatory Visit: Payer: Self-pay | Admitting: Family Medicine

## 2020-11-21 ENCOUNTER — Other Ambulatory Visit: Payer: Self-pay | Admitting: Family Medicine

## 2020-11-22 ENCOUNTER — Telehealth: Payer: Self-pay | Admitting: *Deleted

## 2020-11-22 NOTE — Telephone Encounter (Signed)
Pt declined AWV. °

## 2020-11-22 NOTE — Telephone Encounter (Signed)
Last office visit- 09/27/20 Last refill Tramadol-03/08/20--720 tabs, 1 refill  No future office visit is scheduled. Can this patient receive a refill?

## 2020-11-23 DIAGNOSIS — G4733 Obstructive sleep apnea (adult) (pediatric): Secondary | ICD-10-CM | POA: Diagnosis not present

## 2020-12-09 ENCOUNTER — Telehealth: Payer: Self-pay | Admitting: Family Medicine

## 2020-12-09 NOTE — Telephone Encounter (Signed)
Patient brought in paperwork that he needs Dr.Fry to complete. Paperwork would placed in the folder.  Paperwork could be faxed to ([employer]) [phone] when completed.  Please advise

## 2020-12-14 NOTE — Telephone Encounter (Signed)
Pt paperwork was received and placed in Dr Sarajane Jews folder for completing, pt will be notified when ready

## 2020-12-16 ENCOUNTER — Encounter: Payer: Self-pay | Admitting: Family Medicine

## 2020-12-16 ENCOUNTER — Other Ambulatory Visit (HOSPITAL_COMMUNITY): Payer: Self-pay

## 2020-12-16 MED ORDER — DILTIAZEM HCL ER COATED BEADS 120 MG PO CP24: ORAL_CAPSULE | ORAL | 1 refills | Status: DC

## 2020-12-19 ENCOUNTER — Telehealth: Payer: Self-pay

## 2020-12-19 NOTE — Telephone Encounter (Signed)
Pt [employer] form was faxed to the Fax number provided Copy sent to scanning

## 2020-12-22 NOTE — Telephone Encounter (Signed)
Form was completed and faxed to [employer], Pt is aware that a copy was sent to scanning

## 2020-12-23 DIAGNOSIS — G4733 Obstructive sleep apnea (adult) (pediatric): Secondary | ICD-10-CM | POA: Diagnosis not present

## 2020-12-26 ENCOUNTER — Encounter: Payer: Self-pay | Admitting: Family Medicine

## 2020-12-27 MED ORDER — AMOXICILLIN 500 MG PO CAPS: ORAL_CAPSULE | ORAL | 5 refills | Status: DC

## 2020-12-27 NOTE — Telephone Encounter (Signed)
Done

## 2020-12-29 ENCOUNTER — Encounter: Payer: Self-pay | Admitting: Family Medicine

## 2021-01-04 ENCOUNTER — Encounter: Payer: Self-pay | Admitting: Cardiovascular Disease

## 2021-01-04 ENCOUNTER — Other Ambulatory Visit: Payer: Self-pay

## 2021-01-04 ENCOUNTER — Ambulatory Visit: Payer: Medicare HMO | Admitting: Cardiovascular Disease

## 2021-01-04 VITALS — BP 130/82 | HR 92 | Ht 70.0 in | Wt 240.8 lb

## 2021-01-04 DIAGNOSIS — I4891 Unspecified atrial fibrillation: Secondary | ICD-10-CM

## 2021-01-04 DIAGNOSIS — I480 Paroxysmal atrial fibrillation: Secondary | ICD-10-CM | POA: Diagnosis not present

## 2021-01-04 DIAGNOSIS — I10 Essential (primary) hypertension: Secondary | ICD-10-CM

## 2021-01-04 DIAGNOSIS — G4733 Obstructive sleep apnea (adult) (pediatric): Secondary | ICD-10-CM | POA: Diagnosis not present

## 2021-01-04 NOTE — Progress Notes (Signed)
Cardiology Office Note:    Date:  01/04/2021   ID:  [name], DOB [birth year], MRN [id number]  PCP:  William Morale, MD  [facility] Cardiologist:  William Moores, MD  [name] Electrophysiologist:  None   Referring MD: William Morale, MD   Chief Complaint  Patient presents with   Atrial Fibrillation   Problem List 1. Paroxysmal atrial fib 2.  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  Obstructive sleep apnea.  CPAP started Aug. 11, 2021   Oct. 15, 2021   [name] is a 60 y.o. male with a hx of atrial fib, OSA. I met him during a hospitalization several months ago . The afib was associated with an upper respiratory infection  He converted back to NSR with cardizem drip  TSH was normal  Echo -  Aug. 2, 2021 shows normal LV systolic function, mild diastolic dysfunction. Mild dilatation of the ascending aorta 39 mm  Had sleep study.   Had 31 apneac episodes per hour.  Felt better the next day .  Still waiting on his CPAP machine.  Wt. Is 246 ( down 13 lbs )  Is a body builder  Has had a chronic cough.   Has seen pulmonary   Nov. 2, 2022: Seen for follow up of his PAF  Had recurrent Afib. Was started on Eliquis , was cardioverted. Now is off his Eliquis  Was started on Diltiazem  -  Was also started on Lisinopril - HCT. Either the Dilt or the Lisinopril is causing him to be very fatigued  We discussed ablation  He will need to get his CPAP mask / OSA controlled He would be a good candidate for Afib ablation if he still has Afib after he gets his OSA under control .    Past Medical History:  Diagnosis Date   Arthritis    Cancer [facility])    prostate, sees Dr. Risa Grill    Colon polyps    Degenerative disc disease, cervical    Degenerative disc disease, lumbar    Depression    GERD (gastroesophageal reflux disease)    Low back pain    Osteopenia     Past Surgical History:  Procedure Laterality Date   CERVICAL FUSION  2015   C3 through C7,  per Dr. Harl Bowie    COLONOSCOPY  03/26/2018    per Dr. Alessandra Bevels, adenomatous polyps, repeat in 3 yrs    CYSTECTOMY     benign, right hip   GYNECOMASTIA EXCISION     bilateral, reduction   JOINT REPLACEMENT  12-20-11   hx. LTHA, now RTHA planned   LAMINECTOMY  2/09   lumbar   LUMBAR FUSION  2015   L4 through S1, per Dr. Harl Bowie    LUMBAR MICRODISCECTOMY  10/15/08   L-4-5, per Dr. Jenne Campus at [facility]  12-13-10   robotic prostatectomy per Dr. Risa Grill    TOTAL HIP ARTHROPLASTY  12-20-11   2010 left hip   TOTAL HIP ARTHROPLASTY  12/26/2011   Procedure: TOTAL HIP ARTHROPLASTY;  Surgeon: Gearlean Alf, MD;  Location: [facility];  Service: Orthopedics;  Laterality: Right;    Current Medications: Current Meds  Medication Sig   amoxicillin (AMOXIL) 500 MG capsule Take 4 capsules the morning of any dental procedures   anastrozole (ARIMIDEX) 1 MG tablet Take 1 tablet (1 mg total) by mouth daily.   diclofenac (VOLTAREN) 75 MG EC tablet Take 1  tablet (75 mg total) by mouth 2 (two) times daily.   lisinopril-hydrochlorothiazide (ZESTORETIC) 10-12.5 MG tablet TAKE 1 TABLET BY MOUTH EVERY DAY   omeprazole (PRILOSEC) 40 MG capsule TAKE 1 CAPSULE EVERY DAY   testosterone cypionate (DEPOTESTOSTERONE CYPIONATE) 200 MG/ML injection INJECT 1 ML (200 MG TOTAL) INTO THE MUSCLE EVERY 7 (SEVEN) DAYS.   traMADol (ULTRAM) 50 MG tablet TAKE 2 TABLETS EVERY 6 HOURS AS NEEDED FOR PAIN   [DISCONTINUED] diltiazem (CARDIZEM CD) 120 MG 24 hr capsule TAKE 1 CAPSULE BY MOUTH EVERY DAY     Allergies:   Flexeril [cyclobenzaprine], Oxycodone-acetaminophen, and Oxycodone-acetaminophen   Social History   Socioeconomic History   Marital status: Divorced    Spouse name: Not on file   Number of children: Not on file   Years of education: Not on file   Highest education level: Not on file  Occupational History   Not on file  Tobacco Use   Smoking status: Never   Smokeless tobacco:  Never  Substance and Sexual Activity   Alcohol use: No    Alcohol/week: 0.0 standard drinks    Comment: rare   Drug use: No   Sexual activity: Yes  Other Topics Concern   Not on file  Social History Narrative   Lives alone in a one story home.  No children.     On disability since September 2016.  He was previously working in Engineer, technical sales.    Education: high school.   Social Determinants of Health   Financial Resource Strain: Not on file  Food Insecurity: Not on file  Transportation Needs: Not on file  Physical Activity: Not on file  Stress: Not on file  Social Connections: Not on file     Family History: The patient's family history includes Arthritis in an other family member; Cancer in his brother; Healthy in his mother; Hypertension in his father; Suicidality in his father.  ROS:   Please see the history of present illness.     All other systems reviewed and are negative.  EKGs/Labs/Other Studies Reviewed:    The following studies were reviewed today:  EKG:     Recent Labs: 03/08/2020: ALT 34 10/21/2020: BUN 20; Creatinine, Ser 1.18; Hemoglobin 15.5; Magnesium 2.1; Platelets 333; Potassium 4.7; Sodium 133  Recent Lipid Panel    Component Value Date/Time   CHOL 196 03/08/2020 0936   TRIG 137.0 03/08/2020 0936   HDL 29.30 (L) 03/08/2020 0936   CHOLHDL 7 03/08/2020 0936   VLDL 27.4 03/08/2020 0936   LDLCALC 139 (H) 03/08/2020 0936   LDLDIRECT 138.8 08/17/2009 0755     Risk Assessment/Calculations:     CHA2DS2-VASc Score = 1 0 This indicates a 0.6% annual risk of stroke. The patient's score is based upon: CHF History: 0 HTN History: 1 Diabetes History: 0 Stroke History: 0 Vascular Disease History: 0 Age Score: 0 Gender Score: 0       Physical Exam:    Physical Exam: Blood pressure 130/82, pulse 92, height _0  (1.778 m), weight 240 lb 12.8 oz (109.2 kg), SpO2 98 %.  GEN:  middle age, healthy appearing man.  NAD  HEENT: Normal NECK: No JVD; No carotid  bruits LYMPHATICS: No lymphadenopathy CARDIAC: RRR  RESPIRATORY:  Clear to auscultation without rales, wheezing or rhonchi  ABDOMEN: Soft, non-tender, non-distended MUSCULOSKELETAL:  No edema; No deformity  SKIN: Warm and dry NEUROLOGIC:  Alert and oriented x 3   ASSESSMENT:    1. Paroxysmal atrial fibrillation (HCC)   2.  Primary hypertension   3. OSA (obstructive sleep apnea)   4. Atrial fibrillation with RVR (HCC)    PLAN:     Paroxysmal atrial fibrillation: [name]  presents for further evaluation of his paroxysmal atrial fibrillation.  He is CHA2DS2-VASc or is 0.   I suspect that his main issue is poorly controlled obstructive sleep apnea.  He was fitted for a CPAP mask/nasal pillows but somehow this is not working for him.  He is meeting with the sleep doctors/sleep program tomorrow to see if he can use a different type of mask.  I suspect getting his obstructive sleep apnea under control will help with his fatigue and will help prevent further episodes of atrial fibrillation.   If he does continue to have episodes of paroxysmal atrial fibrillation once his obstructive sleep apnea is controlled and I think that he should be referred to electrophysiology for the consideration of ablation.  He would be an excellent candidate for A. fib ablation. He still has about 6 Eliquis tablets.  If he goes back in atrial fibrillation have instructed him to go and start Eliquis 5 mg twice a day.  He will need to notify us to send in a refill.  This will put him several days ahead of schedule if he needs to be cardioverted again.   Medication Adjustments/Labs and Tests Ordered: Current medicines are reviewed at length with the patient today.  Concerns regarding medicines are outlined above.  No orders of the defined types were placed in this encounter.  No orders of the defined types were placed in this encounter.    Patient Instructions  Medication Instructions:  1) DISCONTINUE  Diltiazem  *If you need a refill on your cardiac medications before your next appointment, please call your pharmacy*   Lab Work: None If you have labs (blood work) drawn today and your tests are completely normal, you will receive your results only by: [place] (if you have MyChart) OR A paper copy in the mail If you have any lab test that is abnormal or we need to change your treatment, we will call you to review the results.   Testing/Procedures: None   Follow-Up: At [facility], you and your health needs are our priority.  As part of our continuing mission to provide you with exceptional heart care, we have created designated Provider Care Teams.  These Care Teams include your primary Cardiologist (physician) and Advanced Practice Providers (APPs -  Physician Assistants and Nurse Practitioners) who all work together to provide you with the care you need, when you need it.  We recommend signing up for the patient portal called "MyChart".  Sign up information is provided on this After Visit Summary.  MyChart is used to connect with patients for Virtual Visits (Telemedicine).  Patients are able to view lab/test results, encounter notes, upcoming appointments, etc.  Non-urgent messages can be sent to your provider as well.   To learn more about what you can do with MyChart, go to [email or web address].    Your next appointment:   6 month(s)  The format for your next appointment:   In Person  Provider:   You will see one of the following Advanced Practice Providers on your designated Care Team:   Richardson Dopp, PA-C Robbie Lis, [place]     Other Instructions     Signed, William Moores, MD  01/04/2021 11:37 AM    [place]

## 2021-01-04 NOTE — Patient Instructions (Signed)
Medication Instructions:  1) DISCONTINUE Diltiazem  *If you need a refill on your cardiac medications before your next appointment, please call your pharmacy*   Lab Work: None If you have labs (blood work) drawn today and your tests are completely normal, you will receive your results only by: [place] (if you have MyChart) OR A paper copy in the mail If you have any lab test that is abnormal or we need to change your treatment, we will call you to review the results.   Testing/Procedures: None   Follow-Up: At [facility], you and your health needs are our priority.  As part of our continuing mission to provide you with exceptional heart care, we have created designated Provider Care Teams.  These Care Teams include your primary Cardiologist (physician) and Advanced Practice Providers (APPs -  Physician Assistants and Nurse Practitioners) who all work together to provide you with the care you need, when you need it.  We recommend signing up for the patient portal called "MyChart".  Sign up information is provided on this After Visit Summary.  MyChart is used to connect with patients for Virtual Visits (Telemedicine).  Patients are able to view lab/test results, encounter notes, upcoming appointments, etc.  Non-urgent messages can be sent to your provider as well.   To learn more about what you can do with MyChart, go to [email or web address].    Your next appointment:   6 month(s)  The format for your next appointment:   In Person  Provider:   You will see one of the following Advanced Practice Providers on your designated Care Team:   Richardson Dopp, PA-C Robbie Lis, [place]     Other Instructions

## 2021-01-05 DIAGNOSIS — G4733 Obstructive sleep apnea (adult) (pediatric): Secondary | ICD-10-CM | POA: Diagnosis not present

## 2021-01-06 NOTE — Telephone Encounter (Signed)
Yes I completed the form .please check in Nancy's folder

## 2021-01-18 NOTE — Telephone Encounter (Signed)
Pt form was faxed and confirmation received, copy sent to scanning

## 2021-01-19 DIAGNOSIS — G4733 Obstructive sleep apnea (adult) (pediatric): Secondary | ICD-10-CM | POA: Diagnosis not present

## 2021-01-20 ENCOUNTER — Ambulatory Visit (INDEPENDENT_AMBULATORY_CARE_PROVIDER_SITE_OTHER): Payer: Medicare HMO | Admitting: Family Medicine

## 2021-01-20 ENCOUNTER — Encounter: Payer: Self-pay | Admitting: Family Medicine

## 2021-01-20 VITALS — BP 124/76 | HR 85 | Temp 99.1°F | Wt 239.0 lb

## 2021-01-20 DIAGNOSIS — E291 Testicular hypofunction: Secondary | ICD-10-CM

## 2021-01-20 DIAGNOSIS — E785 Hyperlipidemia, unspecified: Secondary | ICD-10-CM

## 2021-01-20 DIAGNOSIS — G4733 Obstructive sleep apnea (adult) (pediatric): Secondary | ICD-10-CM | POA: Diagnosis not present

## 2021-01-20 DIAGNOSIS — I4891 Unspecified atrial fibrillation: Secondary | ICD-10-CM

## 2021-01-20 DIAGNOSIS — Z23 Encounter for immunization: Secondary | ICD-10-CM

## 2021-01-20 DIAGNOSIS — I10 Essential (primary) hypertension: Secondary | ICD-10-CM | POA: Diagnosis not present

## 2021-01-20 LAB — CBC WITH DIFFERENTIAL/PLATELET
Basophils Absolute: 0 10*3/uL (ref 0.0–0.1)
Basophils Relative: 0.5 % (ref 0.0–3.0)
Eosinophils Absolute: 0.1 10*3/uL (ref 0.0–0.7)
Eosinophils Relative: 2 % (ref 0.0–5.0)
HCT: 44.9 % (ref 39.0–52.0)
Hemoglobin: 14.4 g/dL (ref 13.0–17.0)
Lymphocytes Relative: 15.4 % (ref 12.0–46.0)
Lymphs Abs: 0.8 10*3/uL (ref 0.7–4.0)
MCHC: 32 g/dL (ref 30.0–36.0)
MCV: 76.4 fl — ABNORMAL LOW (ref 78.0–100.0)
Monocytes Absolute: 0.7 10*3/uL (ref 0.1–1.0)
Monocytes Relative: 13.6 % — ABNORMAL HIGH (ref 3.0–12.0)
Neutro Abs: 3.7 10*3/uL (ref 1.4–7.7)
Neutrophils Relative %: 68.5 % (ref 43.0–77.0)
Platelets: 279 10*3/uL (ref 150.0–400.0)
RBC: 5.88 Mil/uL — ABNORMAL HIGH (ref 4.22–5.81)
RDW: 17.5 % — ABNORMAL HIGH (ref 11.5–15.5)
WBC: 5.5 10*3/uL (ref 4.0–10.5)

## 2021-01-20 LAB — BASIC METABOLIC PANEL
BUN: 15 mg/dL (ref 6–23)
CO2: 26 mEq/L (ref 19–32)
Calcium: 9.4 mg/dL (ref 8.4–10.5)
Chloride: 99 mEq/L (ref 96–112)
Creatinine, Ser: 1 mg/dL (ref 0.40–1.50)
GFR: 82.8 mL/min (ref >60.00)
Glucose, Bld: 91 mg/dL (ref 70–99)
Potassium: 4.3 mEq/L (ref 3.5–5.1)
Sodium: 138 mEq/L (ref 135–145)

## 2021-01-20 LAB — PSA: PSA: 0.01 ng/mL — ABNORMAL LOW (ref 0.10–4.00)

## 2021-01-20 LAB — LIPID PANEL
Cholesterol: 212 mg/dL — ABNORMAL HIGH (ref 0–200)
HDL: 28.7 mg/dL — ABNORMAL LOW (ref >39.00)
LDL Cholesterol: 162 mg/dL — ABNORMAL HIGH (ref 0–99)
NonHDL: 183.01
Total CHOL/HDL Ratio: 7
Triglycerides: 103 mg/dL (ref 0.0–149.0)
VLDL: 20.6 mg/dL (ref 0.0–40.0)

## 2021-01-20 LAB — TESTOSTERONE: Testosterone: 818.9 ng/dL (ref 300.00–890.00)

## 2021-01-20 MED ORDER — LISINOPRIL 20 MG PO TABS: 20.0000 mg | ORAL_TABLET | Freq: Every day | ORAL | 3 refills | Status: DC

## 2021-01-20 MED ORDER — METOPROLOL SUCCINATE ER 25 MG PO TB24: 25.0000 mg | ORAL_TABLET | Freq: Every day | ORAL | 3 refills | Status: DC

## 2021-01-20 NOTE — Progress Notes (Signed)
   Subjective:    Patient ID: [name], male    DOB: 1961/01/29, 60 y.o.   MRN: [id number]  HPI Here to follow up on several issues. He saw Dr. Acie Fredrickson a few weeks ago for the atrial fibrillation , and he felt that the main contributing factor was [name]'s sleep apnea. [name] thinks his CPAP never really worked well, so he recently saw a dentist who is making him a dental device to use instead. Currently he is taking Lisinopril HCT 10-12.5 and Diltiazem CD 120 mg daily. He has been getting BP readings of 140-150/80-90 at home. He feels fine. He is fasting for lab work today.    Review of Systems  Constitutional: Negative.   Respiratory: Negative.    Cardiovascular: Negative.       Objective:   Physical Exam Constitutional:      Appearance: Normal appearance.  Cardiovascular:     Rate and Rhythm: Normal rate and regular rhythm.     Pulses: Normal pulses.     Heart sounds: Normal heart sounds.  Pulmonary:     Effort: Pulmonary effort is normal.     Breath sounds: Normal breath sounds.  Neurological:     Mental Status: He is alert.          Assessment & Plan:  I think getting the dental device to help the sleep apnea is a good idea. For the HTN we will stop the medications above and start him on Lisinopril 20 mg and Metoprolol succinate 25 mg daily. Get labs today. Recheck in 3-4 weeks. We spent a total of ( 33  ) minutes reviewing records and discussing these issues.  [name], MD

## 2021-01-21 ENCOUNTER — Other Ambulatory Visit: Payer: Self-pay | Admitting: Family Medicine

## 2021-01-21 LAB — ESTRADIOL: Estradiol: 16 pg/mL (ref <=39)

## 2021-01-30 ENCOUNTER — Telehealth: Payer: Self-pay | Admitting: Family Medicine

## 2021-01-30 NOTE — Telephone Encounter (Signed)
Left message for patient to call back and schedule Medicare Annual Wellness Visit (AWV) either virtually or in office. Left  my Herbie Drape number [phone]   AWV-I per [place] 10/03/17  please schedule at anytime with [facility] Nurse Health Advisor 1 or 2   This should be a 45 minute visit.

## 2021-02-01 ENCOUNTER — Encounter: Payer: Self-pay | Admitting: Family Medicine

## 2021-02-01 ENCOUNTER — Other Ambulatory Visit: Payer: Self-pay | Admitting: Family Medicine

## 2021-02-01 NOTE — Telephone Encounter (Signed)
Pt LOV was on 01/20/2021 Last refill for Testosterone was on 7/12/202 Please advice

## 2021-02-02 MED ORDER — TESTOSTERONE CYPIONATE 200 MG/ML IM SOLN: 200.0000 mg | INTRAMUSCULAR | 4 refills | Status: DC

## 2021-02-02 NOTE — Addendum Note (Signed)
Addended by: [name] A on: 02/02/2021 07:27 AM   Modules accepted: Orders

## 2021-02-02 NOTE — Telephone Encounter (Signed)
Done

## 2021-02-07 ENCOUNTER — Other Ambulatory Visit (HOSPITAL_COMMUNITY): Payer: Self-pay | Admitting: Physician Assistant

## 2021-02-07 ENCOUNTER — Ambulatory Visit (INDEPENDENT_AMBULATORY_CARE_PROVIDER_SITE_OTHER): Payer: Medicare HMO

## 2021-02-07 VITALS — Ht 70.0 in | Wt 231.0 lb

## 2021-02-07 DIAGNOSIS — Z0000 Encounter for general adult medical examination without abnormal findings: Secondary | ICD-10-CM

## 2021-02-07 NOTE — Patient Instructions (Signed)
[name] , Thank you for taking time to come for your Medicare Wellness Visit. I appreciate your ongoing commitment to your health goals. Please review the following plan we discussed and let me know if I can assist you in the future.   Screening recommendations/referrals: Colonoscopy: completed 05/15/2018 Recommended yearly ophthalmology/optometry visit for glaucoma screening and checkup Recommended yearly dental visit for hygiene and checkup  Vaccinations: Influenza vaccine: completed 01/20/2021 Pneumococcal vaccine: due Tdap vaccine: completed 11/28/2016, due 11/29/2026 Shingles vaccine: discussed   Covid-19:  11/03/2019, 10/09/2019  Advanced directives: Advance directive discussed with you today.   Conditions/risks identified: none  Next appointment: Follow up in one year for your annual wellness visit   Preventive Care 40-64 Years, Male Preventive care refers to lifestyle choices and visits with your health care provider that can promote health and wellness. What does preventive care include? A yearly physical exam. This is also called an annual well check. Dental exams once or twice a year. Routine eye exams. Ask your health care provider how often you should have your eyes checked. Personal lifestyle choices, including: Daily care of your teeth and gums. Regular physical activity. Eating a healthy diet. Avoiding tobacco and drug use. Limiting alcohol use. Practicing safe sex. Taking low-dose aspirin every day starting at age 84. What happens during an annual well check? The services and screenings done by your health care provider during your annual well check will depend on your age, overall health, lifestyle risk factors, and family history of disease. Counseling  Your health care provider may ask you questions about your: Alcohol use. Tobacco use. Drug use. Emotional well-being. Home and relationship well-being. Sexual activity. Eating habits. Work and work  Statistician. Screening  You may have the following tests or measurements: Height, weight, and BMI. Blood pressure. Lipid and cholesterol levels. These may be checked every 5 years, or more frequently if you are over 47 years old. Skin check. Lung cancer screening. You may have this screening every year starting at age 33 if you have a 30-pack-year history of smoking and currently smoke or have quit within the past 15 years. Fecal occult blood test (FOBT) of the stool. You may have this test every year starting at age 45. Flexible sigmoidoscopy or colonoscopy. You may have a sigmoidoscopy every 5 years or a colonoscopy every 10 years starting at age 70. Prostate cancer screening. Recommendations will vary depending on your family history and other risks. Hepatitis C blood test. Hepatitis B blood test. Sexually transmitted disease (STD) testing. Diabetes screening. This is done by checking your blood sugar (glucose) after you have not eaten for a while (fasting). You may have this done every 1-3 years. Discuss your test results, treatment options, and if necessary, the need for more tests with your health care provider. Vaccines  Your health care provider may recommend certain vaccines, such as: Influenza vaccine. This is recommended every year. Tetanus, diphtheria, and acellular pertussis (Tdap, Td) vaccine. You may need a Td booster every 10 years. Zoster vaccine. You may need this after age 57. Pneumococcal 13-valent conjugate (PCV13) vaccine. You may need this if you have certain conditions and have not been vaccinated. Pneumococcal polysaccharide (PPSV23) vaccine. You may need one or two doses if you smoke cigarettes or if you have certain conditions. Talk to your health care provider about which screenings and vaccines you need and how often you need them. This information is not intended to replace advice given to you by your health care provider. Make  sure you discuss any questions you  have with your health care provider. Document Released: 03/18/2015 Document Revised: 11/09/2015 Document Reviewed: 12/21/2014 Elsevier Interactive Patient Education  2017 [employer] Prevention in the Home Falls can cause injuries. They can happen to people of all ages. There are many things you can do to make your home safe and to help prevent falls. What can I do on the outside of my home? Regularly fix the edges of walkways and driveways and fix any cracks. Remove anything that might make you trip as you walk through a door, such as a raised step or threshold. Trim any bushes or trees on the path to your home. Use bright outdoor lighting. Clear any walking paths of anything that might make someone trip, such as rocks or tools. Regularly check to see if handrails are loose or broken. Make sure that both sides of any steps have handrails. Any raised decks and porches should have guardrails on the edges. Have any leaves, snow, or ice cleared regularly. Use sand or salt on walking paths during winter. Clean up any spills in your garage right away. This includes oil or grease spills. What can I do in the bathroom? Use night lights. Install grab bars by the toilet and in the tub and shower. Do not use towel bars as grab bars. Use non-skid mats or decals in the tub or shower. If you need to sit down in the shower, use a plastic, non-slip stool. Keep the floor dry. Clean up any water that spills on the floor as soon as it happens. Remove soap buildup in the tub or shower regularly. Attach bath mats securely with double-sided non-slip rug tape. Do not have throw rugs and other things on the floor that can make you trip. What can I do in the bedroom? Use night lights. Make sure that you have a light by your bed that is easy to reach. Do not use any sheets or blankets that are too big for your bed. They should not hang down onto the floor. Have a firm chair that has side arms. You can  use this for support while you get dressed. Do not have throw rugs and other things on the floor that can make you trip. What can I do in the kitchen? Clean up any spills right away. Avoid walking on wet floors. Keep items that you use a lot in easy-to-reach places. If you need to reach something above you, use a strong step stool that has a grab bar. Keep electrical cords out of the way. Do not use floor polish or wax that makes floors slippery. If you must use wax, use non-skid floor wax. Do not have throw rugs and other things on the floor that can make you trip. What can I do with my stairs? Do not leave any items on the stairs. Make sure that there are handrails on both sides of the stairs and use them. Fix handrails that are broken or loose. Make sure that handrails are as long as the stairways. Check any carpeting to make sure that it is firmly attached to the stairs. Fix any carpet that is loose or worn. Avoid having throw rugs at the top or bottom of the stairs. If you do have throw rugs, attach them to the floor with carpet tape. Make sure that you have a light switch at the top of the stairs and the bottom of the stairs. If you do not have them, ask  someone to add them for you. What else can I do to help prevent falls? Wear shoes that: Do not have high heels. Have rubber bottoms. Are comfortable and fit you well. Are closed at the toe. Do not wear sandals. If you use a stepladder: Make sure that it is fully opened. Do not climb a closed stepladder. Make sure that both sides of the stepladder are locked into place. Ask someone to hold it for you, if possible. Clearly mark and make sure that you can see: Any grab bars or handrails. First and last steps. Where the edge of each step is. Use tools that help you move around (mobility aids) if they are needed. These include: Canes. Walkers. Scooters. Crutches. Turn on the lights when you go into a dark area. Replace any light  bulbs as soon as they burn out. Set up your furniture so you have a clear path. Avoid moving your furniture around. If any of your floors are uneven, fix them. If there are any pets around you, be aware of where they are. Review your medicines with your doctor. Some medicines can make you feel dizzy. This can increase your chance of falling. Ask your doctor what other things that you can do to help prevent falls. This information is not intended to replace advice given to you by your health care provider. Make sure you discuss any questions you have with your health care provider. Document Released: 12/16/2008 Document Revised: 07/28/2015 Document Reviewed: 03/26/2014 Elsevier Interactive Patient Education  2017 [employer].

## 2021-02-07 NOTE — Progress Notes (Signed)
I connected with  Meta Hatchet today via telehealth video enabled device and verified that I am speaking with the correct person using two identifiers.   Location: Patient: home Provider: work  Persons participating in virtual visit: [name], [name]  I discussed the limitations, risks, security and privacy concerns of performing an evaluation and management service by telephone and the availability of in person appointments. The patient expressed understanding and agreed to proceed.   Some vital signs may be absent or patient reported.     Subjective:   [name] is a 60 y.o. male who presents for an Initial Medicare Annual Wellness Visit.  Review of Systems     Cardiac Risk Factors include: advanced age (>7men, >78 women);male gender;dyslipidemia;hypertension;obesity (BMI >30kg/m2)     Objective:    Today's Vitals   02/07/21 1542 02/07/21 1543  Weight: 231 lb (104.8 kg)   Height: 5\' 10"  (1.778 m)   PainSc:  4    Body mass index is 33.15 kg/m.  Advanced Directives 02/07/2021 10/14/2019 12/17/2014 12/28/2011 12/26/2011 12/20/2011  Does Patient Have a Medical Advance Directive? No No No - Patient does not have advance directive;Patient would not like information Patient does not have advance directive  Would patient like information on creating a medical advance directive? - No - Patient declined No - patient declined information - - -  Pre-existing out of facility DNR order (yellow form or pink MOST form) - - - No No No    Current Medications (verified) Outpatient Encounter Medications as of 02/07/2021  Medication Sig   anastrozole (ARIMIDEX) 1 MG tablet Take 1 tablet (1 mg total) by mouth daily.   Cholecalciferol (VITAMIN D) 125 MCG (5000 UT) CAPS Take 5,000 Units by mouth daily.   diclofenac (VOLTAREN) 75 MG EC tablet Take 1 tablet (75 mg total) by mouth 2 (two) times daily.   lisinopril (ZESTRIL) 20 MG tablet Take 1 tablet (20 mg total) by mouth  daily.   metoprolol succinate (TOPROL-XL) 25 MG 24 hr tablet Take 1 tablet (25 mg total) by mouth daily.   omeprazole (PRILOSEC) 40 MG capsule TAKE 1 CAPSULE EVERY DAY   testosterone cypionate (DEPOTESTOSTERONE CYPIONATE) 200 MG/ML injection Inject 1 mL (200 mg total) into the muscle every 7 (seven) days.   traMADol (ULTRAM) 50 MG tablet TAKE 2 TABLETS EVERY 6 HOURS AS NEEDED FOR PAIN   No facility-administered encounter medications on file as of 02/07/2021.    Allergies (verified) Flexeril [cyclobenzaprine], Oxycodone-acetaminophen, and Oxycodone-acetaminophen   History: Past Medical History:  Diagnosis Date   Arthritis    Cancer ([place])    prostate, sees Dr. Risa Grill    Colon polyps    Degenerative disc disease, cervical    Degenerative disc disease, lumbar    Depression    GERD (gastroesophageal reflux disease)    Low back pain    Osteopenia    Past Surgical History:  Procedure Laterality Date   CERVICAL FUSION  2015   C3 through C7, per Dr. Harl Bowie    COLONOSCOPY  03/26/2018    per Dr. Alessandra Bevels, adenomatous polyps, repeat in 3 yrs    CYSTECTOMY     benign, right hip   GYNECOMASTIA EXCISION     bilateral, reduction   HIP SURGERY Right    12/2020   JOINT REPLACEMENT  12/20/2011   hx. LTHA, now RTHA planned   LAMINECTOMY  04/2007   lumbar   LUMBAR FUSION  2015   L4 through S1, per Dr.  Branch    LUMBAR MICRODISCECTOMY  10/15/2008   L-4-5, per Dr. Jenne Campus at [place]  12/13/2010   robotic prostatectomy per Dr. Risa Grill    TOTAL HIP ARTHROPLASTY  12/20/2011   2010 left hip   TOTAL HIP ARTHROPLASTY  12/26/2011   Procedure: TOTAL HIP ARTHROPLASTY;  Surgeon: Gearlean Alf, MD;  Location: [facility];  Service: Orthopedics;  Laterality: Right;   Family History  Problem Relation Age of Onset   Hypertension Father    Suicidality Father    Healthy Mother    Arthritis Other    Cancer Brother        Bone cancer   Social History    Socioeconomic History   Marital status: Divorced    Spouse name: Not on file   Number of children: Not on file   Years of education: Not on file   Highest education level: Not on file  Occupational History   Not on file  Tobacco Use   Smoking status: Never   Smokeless tobacco: Never  Vaping Use   Vaping Use: Never used  Substance and Sexual Activity   Alcohol use: No    Alcohol/week: 0.0 standard drinks    Comment: rare   Drug use: No   Sexual activity: Yes  Other Topics Concern   Not on file  Social History Narrative   Lives alone in a one story home.  No children.     On disability since September 2016.  He was previously working in Engineer, technical sales.    Education: high school.   Social Determinants of Health   Financial Resource Strain: Low Risk    Difficulty of Paying Living Expenses: Not hard at all  Food Insecurity: No Food Insecurity   Worried About Charity fundraiser in the Last Year: Never true   Time in the Last Year: Never true  Transportation Needs: No Transportation Needs   Lack of Transportation (Medical): No   Lack of Transportation (Non-Medical): No  Physical Activity: Sufficiently Active   Days of Exercise per Week: 5 days   Minutes of Exercise per Session: 60 min  Stress: No Stress Concern Present   Feeling of Stress : Not at all  Social Connections: Socially Isolated   Frequency of Communication with Friends and Family: More than three times a week   Frequency of Social Gatherings with Friends and Family: Once a week   Attends Religious Services: Never   Marine scientist or Organizations: No   Attends Music therapist: Not on file   Marital Status: Divorced    Tobacco Counseling Counseling given: Not Answered   Clinical Intake:  Pre-visit preparation completed: Yes  Pain : 0-10 Pain Score: 4  Pain Type: Chronic pain Pain Location: Generalized Pain Descriptors / Indicators: Aching Pain Onset: More than a month  ago Pain Frequency: Constant     Nutritional Status: BMI > 30  Obese Nutritional Risks: None Diabetes: No  How often do you need to have someone help you when you read instructions, pamphlets, or other written materials from your doctor or pharmacy?: 1 - Never What is the last grade level you completed in school?: 12th grade  Diabetic? no  Interpreter Needed?: No  Information entered by :: NAllen LPN   Activities of Daily Living In your present state of health, do you have any difficulty performing the following activities: 02/07/2021 02/06/2021  Hearing? N N  Vision? Tempie Donning  Comment blurried distance -  Difficulty concentrating or making decisions? N Y  Walking or climbing stairs? Y Y  Dressing or bathing? N N  Doing errands, shopping? N N  Preparing Food and eating ? N N  Using the Toilet? N N  In the past six months, have you accidently leaked urine? Y Y  Comment a little with laughing or strain -  Do you have problems with loss of bowel control? N N  Managing your Medications? N N  Managing your Finances? N N  Housekeeping or managing your Housekeeping? N Y  Some recent data might be hidden    Patient Care Team: Laurey Morale, MD as PCP - General Nahser, Wonda Cheng, MD as PCP - Cardiology (Cardiology)  Indicate any recent Medical Services you may have received from other than [facility] providers in the past year (date may be approximate).     Assessment:   This is a routine wellness examination for Ly.  Hearing/Vision screen Vision Screening - Comments:: No regular eye exams,  Dietary issues and exercise activities discussed: Current Exercise Habits: Home exercise routine, Type of exercise: strength training/weights;Other - see comments (recumbent bike), Time (Minutes): 60   Goals Addressed             This Visit's Progress    Patient Stated       02/07/2021, continue going to the gym and remain as healthy as possible       Depression Screen PHQ 2/9  Scores 02/07/2021 01/20/2021 04/18/2020  PHQ - 2 Score 0 0 0  PHQ- 9 Score - 0 -    Fall Risk Fall Risk  02/07/2021 02/06/2021 12/31/2014  Falls in the past year? 0 0 Yes  Number falls in past yr: - 0 1  Injury with Fall? - 0 Yes  Risk Factor Category  - - High Fall Risk  Risk for fall due to : Medication side effect - Impaired balance/gait;Impaired mobility  Follow up Falls evaluation completed;Education provided;Falls prevention discussed - Falls evaluation completed    FALL RISK PREVENTION PERTAINING TO THE HOME:  Any stairs in or around the home? No  If so, are there any without handrails? N/a Home free of loose throw rugs in walkways, pet beds, electrical cords, etc? Yes  Adequate lighting in your home to reduce risk of falls? Yes   ASSISTIVE DEVICES UTILIZED TO PREVENT FALLS:  Life alert? No  Use of a cane, walker or w/c? No  Grab bars in the bathroom? Yes  Shower chair or bench in shower? Yes  Elevated toilet seat or a handicapped toilet? Yes   TIMED UP AND GO:  Was the test performed? No .      Cognitive Function:     6CIT Screen 02/07/2021  What Year? 0 points  What month? 0 points  What time? 0 points  Count back from 20 0 points  Months in reverse 0 points  Repeat phrase 0 points  Total Score 0    Immunizations Immunization History  Administered Date(s) Administered   Influenza,inj,Quad PF,6+ Mos 11/28/2016, 01/22/2018, 12/22/2018, 01/20/2021   Influenza-Unspecified 12/22/2018, 01/23/2019   PFIZER(Purple Top)SARS-COV-2 Vaccination 10/09/2019, 11/03/2019   Tdap 11/28/2016    TDAP status: Up to date  Flu Vaccine status: Up to date  Pneumococcal vaccine status: Due, Education has been provided regarding the importance of this vaccine. Advised may receive this vaccine at local pharmacy or Health Dept. Aware to provide a copy of the vaccination record if obtained from  local pharmacy or Health Dept. Verbalized acceptance and understanding.  Covid-19  vaccine status: Completed vaccines  Qualifies for Shingles Vaccine? Yes   Zostavax completed No   Shingrix Completed?: No.    Education has been provided regarding the importance of this vaccine. Patient has been advised to call insurance company to determine out of pocket expense if they have not yet received this vaccine. Advised may also receive vaccine at local pharmacy or Health Dept. Verbalized acceptance and understanding.  Screening Tests Health Maintenance  Topic Date Due   COVID-19 Vaccine (3 - Pfizer risk series) 02/23/2021 (Originally 12/01/2019)   Pneumococcal Vaccine 64-22 Years old (1 - PCV) 03/30/2021 (Originally 04/10/1966)   Zoster Vaccines- Shingrix (1 of 2) 04/30/2021 (Originally 04/10/1981)   TETANUS/TDAP  11/29/2026   COLONOSCOPY (Pts 45-41yrs Insurance coverage will need to be confirmed)  05/05/2028   INFLUENZA VACCINE  Completed   Hepatitis C Screening  Completed   HIV Screening  Completed   HPV VACCINES  Aged Out    Health Maintenance  There are no preventive care reminders to display for this patient.   Colorectal cancer screening: Type of screening: Colonoscopy. Completed 05/15/2018. Repeat every 3 years  Lung Cancer Screening: (Low Dose CT Chest recommended if Age 84-80 years, 30 pack-year currently smoking OR have quit w/in 15years.) does not qualify.   Lung Cancer Screening Referral: no  Additional Screening:  Hepatitis C Screening: does qualify; Completed 07/31/2017  Vision Screening: Recommended annual ophthalmology exams for early detection of glaucoma and other disorders of the eye. Is the patient up to date with their annual eye exam?  No  Who is the provider or what is the name of the office in which the patient attends annual eye exams? none If pt is not established with a provider, would they like to be referred to a provider to establish care? No .   Dental Screening: Recommended annual dental exams for proper oral hygiene  Community Resource  Referral / Chronic Care Management: CRR required this visit?  No   CCM required this visit?  No      Plan:     I have personally reviewed and noted the following in the patient's chart:   Medical and social history Use of alcohol, tobacco or illicit drugs  Current medications and supplements including opioid prescriptions. Patient is currently taking opioid prescriptions. Information provided to patient regarding non-opioid alternatives. Patient advised to discuss non-opioid treatment plan with their provider. Functional ability and status Nutritional status Physical activity Advanced directives List of other physicians Hospitalizations, surgeries, and ER visits in previous 12 months Vitals Screenings to include cognitive, depression, and falls Referrals and appointments  In addition, I have reviewed and discussed with patient certain preventive protocols, quality metrics, and best practice recommendations. A written personalized care plan for preventive services as well as general preventive health recommendations were provided to patient.     Kellie Simmering, LPN   [id number]   Nurse Notes: none

## 2021-02-09 DIAGNOSIS — G4733 Obstructive sleep apnea (adult) (pediatric): Secondary | ICD-10-CM | POA: Diagnosis not present

## 2021-02-13 ENCOUNTER — Encounter: Payer: Self-pay | Admitting: Family Medicine

## 2021-02-14 MED ORDER — HYDROCODONE BIT-HOMATROP MBR 5-1.5 MG/5ML PO SOLN: 5.0000 mL | ORAL | 0 refills | Status: DC | PRN

## 2021-02-14 MED ORDER — AZITHROMYCIN 250 MG PO TABS: ORAL_TABLET | ORAL | 0 refills | Status: DC

## 2021-02-14 NOTE — Telephone Encounter (Signed)
I sent both of these in

## 2021-02-20 ENCOUNTER — Telehealth (INDEPENDENT_AMBULATORY_CARE_PROVIDER_SITE_OTHER): Payer: Medicare HMO | Admitting: Family Medicine

## 2021-02-20 ENCOUNTER — Encounter: Payer: Self-pay | Admitting: Family Medicine

## 2021-02-20 VITALS — Temp 98.7°F

## 2021-02-20 DIAGNOSIS — J40 Bronchitis, not specified as acute or chronic: Secondary | ICD-10-CM | POA: Diagnosis not present

## 2021-02-20 MED ORDER — DOXYCYCLINE HYCLATE 100 MG PO CAPS: 100.0000 mg | ORAL_CAPSULE | Freq: Two times a day (BID) | ORAL | 0 refills | Status: DC

## 2021-02-20 NOTE — Progress Notes (Signed)
Subjective:    Patient ID: [name], male    DOB: 12-07-1960, 60 y.o.   MRN: [id number]  HPI Virtual Visit via Video Note  I connected with the patient on 02/20/21 at  3:45 PM EST by a video enabled telemedicine application and verified that I am speaking with the correct person using two identifiers.  Location patient: home Location provider:work or home office Persons participating in the virtual visit: patient, provider  I discussed the limitations of evaluation and management by telemedicine and the availability of in person appointments. The patient expressed understanding and agreed to proceed.   HPI: Here for 10 days of chest congestion, coughing up green sputum, and fever to 99 degrees. No chest pain or SOB. He has taken a Zpack but this did not help much. He is now taking Mucinex 1200 mg BID.    ROS: See pertinent positives and negatives per HPI.  Past Medical History:  Diagnosis Date   Arthritis    Cancer [facility])    prostate, sees Dr. Risa Grill    Colon polyps    Degenerative disc disease, cervical    Degenerative disc disease, lumbar    Depression    GERD (gastroesophageal reflux disease)    Low back pain    Osteopenia     Past Surgical History:  Procedure Laterality Date   CERVICAL FUSION  2015   C3 through C7, per Dr. Harl Bowie    COLONOSCOPY  03/26/2018    per Dr. Alessandra Bevels, adenomatous polyps, repeat in 3 yrs    CYSTECTOMY     benign, right hip   GYNECOMASTIA EXCISION     bilateral, reduction   HIP SURGERY Right    12/2020   JOINT REPLACEMENT  12/20/2011   hx. LTHA, now RTHA planned   LAMINECTOMY  04/2007   lumbar   LUMBAR FUSION  2015   L4 through S1, per Dr. Harl Bowie    LUMBAR MICRODISCECTOMY  10/15/2008   L-4-5, per Dr. Jenne Campus at [facility]  12/13/2010   robotic prostatectomy per Dr. Risa Grill    TOTAL HIP ARTHROPLASTY  12/20/2011   2010 left hip   TOTAL HIP ARTHROPLASTY  12/26/2011   Procedure: TOTAL HIP  ARTHROPLASTY;  Surgeon: Gearlean Alf, MD;  [facility]: [facility];  Service: Orthopedics;  Laterality: Right;    Family History  Problem Relation Age of Onset   Hypertension Father    Suicidality Father    Healthy Mother    Arthritis Other    Cancer Brother        Bone cancer     Current Outpatient Medications:    doxycycline (VIBRAMYCIN) 100 MG capsule, Take 1 capsule (100 mg total) by mouth 2 (two) times daily for 10 days., Disp: 20 capsule, Rfl: 0   anastrozole (ARIMIDEX) 1 MG tablet, Take 1 tablet (1 mg total) by mouth daily., Disp: 90 tablet, Rfl: 3   Cholecalciferol (VITAMIN D) 125 MCG (5000 UT) CAPS, Take 5,000 Units by mouth daily., Disp: , Rfl:    diclofenac (VOLTAREN) 75 MG EC tablet, Take 1 tablet (75 mg total) by mouth 2 (two) times daily., Disp: 180 tablet, Rfl: 3   HYDROcodone bit-homatropine (HYCODAN) 5-1.5 MG/5ML syrup, Take 5 mLs by mouth every 4 (four) hours as needed for cough., Disp: 240 mL, Rfl: 0   lisinopril (ZESTRIL) 20 MG tablet, Take 1 tablet (20 mg total) by mouth daily., Disp: 30 tablet, Rfl: 3   metoprolol succinate (TOPROL-XL) 25 MG  24 hr tablet, Take 1 tablet (25 mg total) by mouth daily., Disp: 30 tablet, Rfl: 3   omeprazole (PRILOSEC) 40 MG capsule, TAKE 1 CAPSULE EVERY DAY, Disp: 90 capsule, Rfl: 3   testosterone cypionate (DEPOTESTOSTERONE CYPIONATE) 200 MG/ML injection, Inject 1 mL (200 mg total) into the muscle every 7 (seven) days., Disp: 10 mL, Rfl: 4   traMADol (ULTRAM) 50 MG tablet, TAKE 2 TABLETS EVERY 6 HOURS AS NEEDED FOR PAIN, Disp: 240 tablet, Rfl: 5  EXAM:  VITALS per patient if applicable:  GENERAL: alert, oriented, appears well and in no acute distress  HEENT: atraumatic, conjunttiva clear, no obvious abnormalities on inspection of external nose and ears  NECK: normal movements of the head and neck  LUNGS: on inspection no signs of respiratory distress, breathing rate appears normal, no obvious gross SOB, gasping or wheezing  CV: no  obvious cyanosis  MS: moves all visible extremities without noticeable abnormality  PSYCH/NEURO: pleasant and cooperative, no obvious depression or anxiety, speech and thought processing grossly intact  ASSESSMENT AND PLAN: Bronchitis, treat with 10 days of Doxycycline. Recheck as needed. [name], MD  Discussed the following assessment and plan:  No diagnosis found.     I discussed the assessment and treatment plan with the patient. The patient was provided an opportunity to ask questions and all were answered. The patient agreed with the plan and demonstrated an understanding of the instructions.   The patient was advised to call back or seek an in-person evaluation if the symptoms worsen or if the condition fails to improve as anticipated.      Review of Systems     Objective:   Physical Exam        Assessment & Plan:

## 2021-02-28 ENCOUNTER — Ambulatory Visit (INDEPENDENT_AMBULATORY_CARE_PROVIDER_SITE_OTHER): Payer: Medicare HMO | Admitting: Family Medicine

## 2021-02-28 ENCOUNTER — Ambulatory Visit (INDEPENDENT_AMBULATORY_CARE_PROVIDER_SITE_OTHER): Payer: Medicare HMO

## 2021-02-28 ENCOUNTER — Telehealth: Payer: Self-pay | Admitting: Family Medicine

## 2021-02-28 ENCOUNTER — Encounter: Payer: Self-pay | Admitting: Family Medicine

## 2021-02-28 ENCOUNTER — Other Ambulatory Visit: Payer: Self-pay

## 2021-02-28 VITALS — BP 120/86 | HR 91 | Temp 98.5°F | Wt 243.0 lb

## 2021-02-28 DIAGNOSIS — R052 Subacute cough: Secondary | ICD-10-CM | POA: Diagnosis not present

## 2021-02-28 DIAGNOSIS — J019 Acute sinusitis, unspecified: Secondary | ICD-10-CM | POA: Diagnosis not present

## 2021-02-28 DIAGNOSIS — R059 Cough, unspecified: Secondary | ICD-10-CM | POA: Diagnosis not present

## 2021-02-28 MED ORDER — LEVOFLOXACIN 500 MG PO TABS: 500.0000 mg | ORAL_TABLET | Freq: Every day | ORAL | 0 refills | Status: AC

## 2021-02-28 MED ORDER — METHYLPREDNISOLONE 4 MG PO TBPK: ORAL_TABLET | ORAL | 0 refills | Status: DC

## 2021-02-28 NOTE — Telephone Encounter (Signed)
Patient calling in with respiratory symptoms: Shortness of breath, chest pain, palpitations or other red words send to Triage  Does the patient have a fever over 100, cough, congestion, sore throat, runny nose, lost of taste/smell (please list symptoms that patient has)? Fever, cough, and headache.  What date did symptoms start? (If over 5 days ago, pt may be scheduled for in person visit)  Have you tested for Covid in the last 5 days? Yes   If yes, was it positive []  OR negative [x] ? If positive in the last 5 days, please schedule virtual visit now. If negative, schedule for an in person OV with the next available provider if PCP has no openings. Please also let patient know they will be tested again (follow the script below)  "you will have to arrive 24mins prior to your appt time to be Covid tested. Please park in back of office at the cone & call [phone] to let the staff know you have arrived. A staff member will meet you at your car to do a rapid covid test. Once the test has resulted you will be notified by phone of your results to determine if appt will remain an in person visit or be converted to a virtual/phone visit. If you arrive less than 51mins before your appt time, your visit will be automatically converted to virtual & any recommended testing will happen AFTER the visit."   [place]  If no availability for virtual visit in office,  please schedule another [place] office  If no availability at another [place] office, please instruct patient that they can schedule an evisit or virtual visit through their mychart account. Visits up to 8pm  patients can be seen in office 5 days after positive COVID test

## 2021-02-28 NOTE — Progress Notes (Signed)
° °  Subjective:    Patient ID: [name], male    DOB: 05-08-1960, 60 y.o.   MRN: [id number]  HPI Here for 17 days of coughing. The cough was productive at first but not now. He has sinus congestion and PND. No ST or SOB. He has night sweats so he apparently has some low grade fevers. He has taken a Zpack and a round of Doxycline with no relief. He has tested negative for the Covid virus twice.    Review of Systems  Constitutional: Negative.   HENT:  Positive for congestion, postnasal drip and sinus pressure. Negative for ear pain and sore throat.   Eyes: Negative.   Respiratory:  Positive for cough. Negative for chest tightness, shortness of breath and wheezing.       Objective:   Physical Exam Constitutional:      Appearance: Normal appearance. He is not ill-appearing.  HENT:     Right Ear: Tympanic membrane, ear canal and external ear normal.     Left Ear: Tympanic membrane, ear canal and external ear normal.     Nose: Nose normal.     Mouth/Throat:     Pharynx: Oropharynx is clear.  Eyes:     Conjunctiva/sclera: Conjunctivae normal.  Pulmonary:     Effort: Pulmonary effort is normal.     Breath sounds: Normal breath sounds.  Lymphadenopathy:     Cervical: No cervical adenopathy.  Neurological:     Mental Status: He is alert.          Assessment & Plan:  He likely has a sinusitis, so we will treat this with 10 days of Levaquin. Add a Medrol dose pack. Get a CXR today.  [name], MD

## 2021-03-03 ENCOUNTER — Ambulatory Visit: Payer: Medicare HMO | Admitting: Family Medicine

## 2021-03-11 ENCOUNTER — Other Ambulatory Visit: Payer: Self-pay | Admitting: Family Medicine

## 2021-03-22 ENCOUNTER — Telehealth (INDEPENDENT_AMBULATORY_CARE_PROVIDER_SITE_OTHER): Payer: Medicare HMO | Admitting: Family Medicine

## 2021-03-22 ENCOUNTER — Encounter: Payer: Self-pay | Admitting: Family Medicine

## 2021-03-22 DIAGNOSIS — B029 Zoster without complications: Secondary | ICD-10-CM | POA: Diagnosis not present

## 2021-03-22 MED ORDER — METHYLPREDNISOLONE 4 MG PO TBPK: ORAL_TABLET | ORAL | 0 refills | Status: DC

## 2021-03-22 MED ORDER — VALACYCLOVIR HCL 1 G PO TABS: 1000.0000 mg | ORAL_TABLET | Freq: Three times a day (TID) | ORAL | 0 refills | Status: DC

## 2021-03-22 MED ORDER — GABAPENTIN 300 MG PO CAPS: 300.0000 mg | ORAL_CAPSULE | Freq: Three times a day (TID) | ORAL | 0 refills | Status: DC

## 2021-03-22 NOTE — Progress Notes (Signed)
Subjective:    Patient ID: [name], male    DOB: 01/13/1961, 61 y.o.   MRN: [id number]  HPI Virtual Visit via Video Note  I connected with the patient on 03/22/21 at  2:15 PM EST by a video enabled telemedicine application and verified that I am speaking with the correct person using two identifiers.  Location patient: home Location provider:work or home office Persons participating in the virtual visit: patient, provider  I discussed the limitations of evaluation and management by telemedicine and the availability of in person appointments. The patient expressed understanding and agreed to proceed.   HPI: Here for another bout of shingles. We saw him in January 2022 for a rash and a burning pain in the left neck area, and this was found to be shingles. After treatment with Valtrex, this resolved. Now for the past 3 days he has the exact same burning pain in the left neck, though this time there is no rash.    ROS: See pertinent positives and negatives per HPI.  Past Medical History:  Diagnosis Date   Arthritis    Cancer [facility])    prostate, sees Dr. Risa Grill    Colon polyps    Degenerative disc disease, cervical    Degenerative disc disease, lumbar    Depression    GERD (gastroesophageal reflux disease)    Low back pain    Osteopenia     Past Surgical History:  Procedure Laterality Date   CERVICAL FUSION  2015   C3 through C7, per Dr. Harl Bowie    COLONOSCOPY  03/26/2018    per Dr. Alessandra Bevels, adenomatous polyps, repeat in 3 yrs    CYSTECTOMY     benign, right hip   GYNECOMASTIA EXCISION     bilateral, reduction   HIP SURGERY Right    12/2020   JOINT REPLACEMENT  12/20/2011   hx. LTHA, now RTHA planned   LAMINECTOMY  04/2007   lumbar   LUMBAR FUSION  2015   L4 through S1, per Dr. Harl Bowie    LUMBAR MICRODISCECTOMY  10/15/2008   L-4-5, per Dr. Jenne Campus at [place]  12/13/2010   robotic prostatectomy per Dr. Risa Grill    TOTAL  HIP ARTHROPLASTY  12/20/2011   2010 left hip   TOTAL HIP ARTHROPLASTY  12/26/2011   Procedure: TOTAL HIP ARTHROPLASTY;  Surgeon: Gearlean Alf, MD;  Location: [facility];  Service: Orthopedics;  Laterality: Right;    Family History  Problem Relation Age of Onset   Hypertension Father    Suicidality Father    Healthy Mother    Arthritis Other    Cancer Brother        Bone cancer     Current Outpatient Medications:    anastrozole (ARIMIDEX) 1 MG tablet, TAKE 1 TABLET BY MOUTH EVERY DAY, Disp: 90 tablet, Rfl: 0   Cholecalciferol (VITAMIN D) 125 MCG (5000 UT) CAPS, Take 5,000 Units by mouth daily., Disp: , Rfl:    diclofenac (VOLTAREN) 75 MG EC tablet, Take 1 tablet (75 mg total) by mouth 2 (two) times daily., Disp: 180 tablet, Rfl: 3   gabapentin (NEURONTIN) 300 MG capsule, Take 1 capsule (300 mg total) by mouth 3 (three) times daily., Disp: 90 capsule, Rfl: 0   HYDROcodone bit-homatropine (HYCODAN) 5-1.5 MG/5ML syrup, Take 5 mLs by mouth every 4 (four) hours as needed for cough., Disp: 240 mL, Rfl: 0   lisinopril (ZESTRIL) 20 MG tablet, Take 1 tablet (20 mg  total) by mouth daily., Disp: 30 tablet, Rfl: 3   methylPREDNISolone (MEDROL DOSEPAK) 4 MG TBPK tablet, As directed, Disp: 21 tablet, Rfl: 0   metoprolol succinate (TOPROL-XL) 25 MG 24 hr tablet, Take 1 tablet (25 mg total) by mouth daily., Disp: 30 tablet, Rfl: 3   omeprazole (PRILOSEC) 40 MG capsule, TAKE 1 CAPSULE EVERY DAY, Disp: 90 capsule, Rfl: 3   testosterone cypionate (DEPOTESTOSTERONE CYPIONATE) 200 MG/ML injection, Inject 1 mL (200 mg total) into the muscle every 7 (seven) days., Disp: 10 mL, Rfl: 4   traMADol (ULTRAM) 50 MG tablet, TAKE 2 TABLETS EVERY 6 HOURS AS NEEDED FOR PAIN, Disp: 240 tablet, Rfl: 5   valACYclovir (VALTREX) 1000 MG tablet, Take 1 tablet (1,000 mg total) by mouth 3 (three) times daily., Disp: 30 tablet, Rfl: 0  EXAM:  VITALS per patient if applicable:  GENERAL: alert, oriented, appears well and in no  acute distress  HEENT: atraumatic, conjunttiva clear, no obvious abnormalities on inspection of external nose and ears  NECK: normal movements of the head and neck  LUNGS: on inspection no signs of respiratory distress, breathing rate appears normal, no obvious gross SOB, gasping or wheezing  CV: no obvious cyanosis  MS: moves all visible extremities without noticeable abnormality  PSYCH/NEURO: pleasant and cooperative, no obvious depression or anxiety, speech and thought processing grossly intact  ASSESSMENT AND PLAN: Shingles. Treat with Valtrex for 10 days, a Medrol dose pack, and Gabapentin. Recheck as needed.  [name], MD  Discussed the following assessment and plan:  No diagnosis found.     I discussed the assessment and treatment plan with the patient. The patient was provided an opportunity to ask questions and all were answered. The patient agreed with the plan and demonstrated an understanding of the instructions.   The patient was advised to call back or seek an in-person evaluation if the symptoms worsen or if the condition fails to improve as anticipated.      Review of Systems     Objective:   Physical Exam        Assessment & Plan:

## 2021-03-23 ENCOUNTER — Other Ambulatory Visit: Payer: Self-pay | Admitting: Family Medicine

## 2021-03-23 DIAGNOSIS — M48061 Spinal stenosis, lumbar region without neurogenic claudication: Secondary | ICD-10-CM

## 2021-03-28 DIAGNOSIS — M542 Cervicalgia: Secondary | ICD-10-CM | POA: Diagnosis not present

## 2021-04-06 ENCOUNTER — Other Ambulatory Visit: Payer: Self-pay | Admitting: Family Medicine

## 2021-04-29 ENCOUNTER — Other Ambulatory Visit: Payer: Self-pay | Admitting: Family Medicine

## 2021-05-04 DIAGNOSIS — D649 Anemia, unspecified: Secondary | ICD-10-CM | POA: Diagnosis not present

## 2021-05-04 DIAGNOSIS — K648 Other hemorrhoids: Secondary | ICD-10-CM | POA: Diagnosis not present

## 2021-05-04 DIAGNOSIS — K5909 Other constipation: Secondary | ICD-10-CM | POA: Diagnosis not present

## 2021-05-12 ENCOUNTER — Encounter: Payer: Self-pay | Admitting: Family Medicine

## 2021-05-12 ENCOUNTER — Ambulatory Visit (INDEPENDENT_AMBULATORY_CARE_PROVIDER_SITE_OTHER): Payer: Medicare HMO | Admitting: Family Medicine

## 2021-05-12 VITALS — BP 136/84 | HR 98 | Temp 98.7°F | Wt 244.0 lb

## 2021-05-12 DIAGNOSIS — I10 Essential (primary) hypertension: Secondary | ICD-10-CM | POA: Diagnosis not present

## 2021-05-12 DIAGNOSIS — K625 Hemorrhage of anus and rectum: Secondary | ICD-10-CM

## 2021-05-12 DIAGNOSIS — D509 Iron deficiency anemia, unspecified: Secondary | ICD-10-CM

## 2021-05-12 DIAGNOSIS — I4891 Unspecified atrial fibrillation: Secondary | ICD-10-CM

## 2021-05-12 DIAGNOSIS — N529 Male erectile dysfunction, unspecified: Secondary | ICD-10-CM | POA: Diagnosis not present

## 2021-05-12 DIAGNOSIS — E785 Hyperlipidemia, unspecified: Secondary | ICD-10-CM | POA: Diagnosis not present

## 2021-05-12 LAB — IBC + FERRITIN
Ferritin: 11.9 ng/mL — ABNORMAL LOW (ref 22.0–322.0)
Iron: 83 ug/dL (ref 42–165)
Saturation Ratios: 15.5 % — ABNORMAL LOW (ref 20.0–50.0)
TIBC: 536.2 ug/dL — ABNORMAL HIGH (ref 250.0–450.0)
Transferrin: 383 mg/dL — ABNORMAL HIGH (ref 212.0–360.0)

## 2021-05-12 LAB — CBC WITH DIFFERENTIAL/PLATELET
Basophils Absolute: 0 10*3/uL (ref 0.0–0.1)
Basophils Relative: 0.4 % (ref 0.0–3.0)
Eosinophils Absolute: 0.1 10*3/uL (ref 0.0–0.7)
Eosinophils Relative: 2.2 % (ref 0.0–5.0)
HCT: 43.9 % (ref 39.0–52.0)
Hemoglobin: 13.9 g/dL (ref 13.0–17.0)
Lymphocytes Relative: 18.8 % (ref 12.0–46.0)
Lymphs Abs: 0.9 10*3/uL (ref 0.7–4.0)
MCHC: 31.6 g/dL (ref 30.0–36.0)
MCV: 70.7 fl — ABNORMAL LOW (ref 78.0–100.0)
Monocytes Absolute: 0.7 10*3/uL (ref 0.1–1.0)
Monocytes Relative: 14.6 % — ABNORMAL HIGH (ref 3.0–12.0)
Neutro Abs: 3.2 10*3/uL (ref 1.4–7.7)
Neutrophils Relative %: 64 % (ref 43.0–77.0)
Platelets: 309 10*3/uL (ref 150.0–400.0)
RBC: 6.21 Mil/uL — ABNORMAL HIGH (ref 4.22–5.81)
RDW: 20.5 % — ABNORMAL HIGH (ref 11.5–15.5)
WBC: 5 10*3/uL (ref 4.0–10.5)

## 2021-05-12 LAB — LIPID PANEL
Cholesterol: 214 mg/dL — ABNORMAL HIGH (ref 0–200)
HDL: 33.5 mg/dL — ABNORMAL LOW (ref >39.00)
LDL Cholesterol: 158 mg/dL — ABNORMAL HIGH (ref 0–99)
NonHDL: 180.67
Total CHOL/HDL Ratio: 6
Triglycerides: 111 mg/dL (ref 0.0–149.0)
VLDL: 22.2 mg/dL (ref 0.0–40.0)

## 2021-05-12 NOTE — Progress Notes (Signed)
? ?  Subjective:  ? ? Patient ID: [name], male    DOB: 14-Feb-1961, 61 y.o.   MRN: [id number] ? ?HPI ?Here for several issues. First he says he is anemic again. He has had recurrent bright red blood in the stools for the past 3 weeks, and he has been applying Preparation H. There is no pain, and he passes stools easily. He sees Dr. Jannette Spanner at [place], and his last colonoscopy 2 years ago revealed some internal hemorrhoids. He saw a NP at the [place] office last week and she got labs which included Hgb 12.8, ferritin 9, and iron 21. WBC and platelets were normal. He has since started taking an iron pill OTC every day. The other issue is erectile dysfunction. This became a problem about a month ago, and he believes this is a side effect of the Metoprolol he is taking. His BP has been well controlled. This was started over a year ago when he had atrial fibrillation. He has been in NSR ever since.  ? ? ?Review of Systems  ?Constitutional:  Positive for fatigue.  ?Respiratory: Negative.    ?Cardiovascular: Negative.   ?Gastrointestinal:  Positive for blood in stool. Negative for abdominal distention, abdominal pain, constipation, diarrhea, nausea, rectal pain and vomiting.  ? ?   ?Objective:  ? Physical Exam ?Constitutional:   ?   Appearance: Normal appearance. He is not ill-appearing.  ?Cardiovascular:  ?   Rate and Rhythm: Normal rate and regular rhythm.  ?   Pulses: Normal pulses.  ?   Heart sounds: Normal heart sounds.  ?Pulmonary:  ?   Effort: Pulmonary effort is normal.  ?   Breath sounds: Normal breath sounds.  ?Abdominal:  ?   General: Abdomen is flat. Bowel sounds are normal. There is no distension.  ?   Palpations: Abdomen is soft. There is no mass.  ?   Tenderness: There is no abdominal tenderness. There is no guarding or rebound.  ?   Hernia: No hernia is present.  ?Neurological:  ?   Mental Status: He is alert.  ? ? ? ? ? ?   ?Assessment & Plan:  ?He seems to have developed some iron  deficiency anemia. We will repeat CBC, iron, and ferritin today. If these are consistent with the labs he had last week, I would recommend that Dwayne see Dr. Mariana Arn again. He will likely need upper and lower endoscopy. Otherwise his ED is likely to be a side effect of the Metoprolol. His BP is stable, so we agreed to stop the Metoprolol and stay on Lisinopril. He will monitor the BP at home.  ?[name], MD ? ?.me ? ?

## 2021-05-14 ENCOUNTER — Encounter: Payer: Self-pay | Admitting: Family Medicine

## 2021-05-15 NOTE — Telephone Encounter (Signed)
I would just check the labs again in about 60 days.  ?

## 2021-05-28 ENCOUNTER — Encounter: Payer: Self-pay | Admitting: Family Medicine

## 2021-05-29 ENCOUNTER — Telehealth: Payer: Self-pay | Admitting: Family Medicine

## 2021-05-29 ENCOUNTER — Telehealth (INDEPENDENT_AMBULATORY_CARE_PROVIDER_SITE_OTHER): Payer: Medicare HMO | Admitting: Family Medicine

## 2021-05-29 ENCOUNTER — Encounter: Payer: Self-pay | Admitting: Family Medicine

## 2021-05-29 DIAGNOSIS — U071 COVID-19: Secondary | ICD-10-CM | POA: Diagnosis not present

## 2021-05-29 MED ORDER — MELOXICAM 15 MG PO TABS
15.0000 mg | ORAL_TABLET | Freq: Every day | ORAL | 3 refills | Status: DC
Start: 2021-05-29 — End: 2021-10-03

## 2021-05-29 MED ORDER — NIRMATRELVIR/RITONAVIR (PAXLOVID)TABLET: 3.0000 | ORAL_TABLET | Freq: Two times a day (BID) | ORAL | 0 refills | Status: AC

## 2021-05-29 NOTE — Telephone Encounter (Signed)
Patient calling in with respiratory symptoms: ?Shortness of breath, chest pain, palpitations or other red words send to Triage ? ?Does the patient have a fever over 100, cough, congestion, sore throat, runny nose, lost of taste/smell (please list symptoms that patient has)? Runny nose, low grade fever, cough,bodyaches ?What date did symptoms start?05-26-2021 ?(If over 5 days ago, pt may be scheduled for in person visit) ? ?Have you tested for Covid in the last 5 days? Yes  ? ?If yes, was it positive '[x]'$  OR negative '[]'$ ? If positive in the last 5 days, please schedule virtual visit now. If negative, schedule for an in person OV with the next available provider if PCP has no openings. Please also let patient know they will be tested again (follow the script below) ? ?"you will have to arrive 51mns prior to your appt time to be Covid tested. Please park in back of office at the cone & call [phone] let the staff know you have arrived. A staff member will meet you at your car to do a rapid covid test. Once the test has resulted you will be notified by phone of your results to determine if appt will remain an in person visit or be converted to a virtual/phone visit. If you arrive less than 376ms before your appt time, your visit will be automatically converted to virtual & any recommended testing will happen AFTER the visit." ?Pt has virtual with dr frSarajane Jews-27-2023 ? ?THINGS TO REMEMBER ? ?If no availability for virtual visit in office,  please schedule another [place] office ? ?If no availability at another [place], please instruct patient that they can schedule an evisit or virtual visit through their mychart account. Visits up to 8pm ? ?patients can be seen in office 5 days after positive COVID test ? ?  ?

## 2021-05-29 NOTE — Progress Notes (Signed)
? ?Subjective:  ? ? Patient ID: [name], male    DOB: 1960-06-19, 61 y.o.   MRN: [id number] ? ?HPI ?Virtual Visit via Video Note ? ?I connected with the patient on 05/29/21 at  3:00 PM EDT by a video enabled telemedicine application and verified that I am speaking with the correct person using two identifiers. ? Location patient: home ?Location provider:work or home office ?Persons participating in the virtual visit: patient, provider ? ?I discussed the limitations of evaluation and management by telemedicine and the availability of in person appointments. The patient expressed understanding and agreed to proceed. ? ? ?HPI: ?Here for a Covid-19 infection. About 3 days ago he devloped fever, body aches, ST, and a dry cough. He tested positive for the Covid virus yesterday. He is drinking fluids. Also he has been taking Diclofenac for years for joint pains, and he has read that this can be associated with heart problmes. He asks if he can switch to Meloxicam.  ? ? ?ROS: See pertinent positives and negatives per HPI. ? ?Past Medical History:  ?Diagnosis Date  ? Arthritis   ? Cancer [facility])   ? prostate, sees Dr. Risa Grill   ? Colon polyps   ? Degenerative disc disease, cervical   ? Degenerative disc disease, lumbar   ? Depression   ? GERD (gastroesophageal reflux disease)   ? Low back pain   ? Osteopenia   ? ? ?Past Surgical History:  ?Procedure Laterality Date  ? CERVICAL FUSION  2015  ? C3 through C7, per Dr. Harl Bowie   ? COLONOSCOPY  03/26/2018  ?  per Dr. Alessandra Bevels, adenomatous polyps, repeat in 3 yrs   ? CYSTECTOMY    ? benign, right hip  ? GYNECOMASTIA EXCISION    ? bilateral, reduction  ? HIP SURGERY Right   ? 12/2020  ? JOINT REPLACEMENT  12/20/2011  ? hx. LTHA, now RTHA planned  ? LAMINECTOMY  04/2007  ? lumbar  ? LUMBAR FUSION  2015  ? L4 through S1, per Dr. Harl Bowie   ? LUMBAR MICRODISCECTOMY  10/15/2008  ? L-4-5, per Dr. Jenne Campus at [facility]  ? PROSTATE SURGERY  12/13/2010  ? robotic  prostatectomy per Dr. Risa Grill   ? TOTAL HIP ARTHROPLASTY  12/20/2011  ? 2010 left hip  ? TOTAL HIP ARTHROPLASTY  12/26/2011  ? Procedure: TOTAL HIP ARTHROPLASTY;  Surgeon: Gearlean Alf, MD;  Location: [facility];  Service: Orthopedics;  Laterality: Right;  ? ? ?Family History  ?Problem Relation Age of Onset  ? Hypertension Father   ? Suicidality Father   ? Healthy Mother   ? Arthritis Other   ? Cancer Brother   ?     Bone cancer  ? ? ? ?Current Outpatient Medications:  ?  anastrozole (ARIMIDEX) 1 MG tablet, TAKE 1 TABLET BY MOUTH EVERY DAY, Disp: 90 tablet, Rfl: 0 ?  Cholecalciferol (VITAMIN D) 125 MCG (5000 UT) CAPS, Take 5,000 Units by mouth daily., Disp: , Rfl:  ?  lisinopril (ZESTRIL) 20 MG tablet, TAKE 1 TABLET BY MOUTH EVERY DAY, Disp: 90 tablet, Rfl: 1 ?  meloxicam (MOBIC) 15 MG tablet, Take 1 tablet (15 mg total) by mouth daily., Disp: 90 tablet, Rfl: 3 ?  nirmatrelvir/ritonavir EUA (PAXLOVID) 20 x 150 MG & 10 x '100MG'$  TABS, Take 3 tablets by mouth 2 (two) times daily for 5 days. (Take nirmatrelvir 150 mg two tablets twice daily for 5 days and ritonavir 100 mg one tablet twice daily for  5 days) Patient GFR is 82, Disp: 30 tablet, Rfl: 0 ?  omeprazole (PRILOSEC) 40 MG capsule, TAKE 1 CAPSULE EVERY DAY, Disp: 90 capsule, Rfl: 3 ?  testosterone cypionate (DEPOTESTOSTERONE CYPIONATE) 200 MG/ML injection, Inject 1 mL (200 mg total) into the muscle every 7 (seven) days., Disp: 10 mL, Rfl: 4 ?  traMADol (ULTRAM) 50 MG tablet, TAKE 2 TABLETS EVERY 6 HOURS AS NEEDED FOR PAIN, Disp: 240 tablet, Rfl: 5 ? ?EXAM: ? ?VITALS per patient if applicable: ? ?GENERAL: alert, oriented, appears well and in no acute distress ? ?HEENT: atraumatic, conjunttiva clear, no obvious abnormalities on inspection of external nose and ears ? ?NECK: normal movements of the head and neck ? ?LUNGS: on inspection no signs of respiratory distress, breathing rate appears normal, no obvious gross SOB, gasping or wheezing ? ?CV: no obvious  cyanosis ? ?MS: moves all visible extremities without noticeable abnormality ? ?PSYCH/NEURO: pleasant and cooperative, no obvious depression or anxiety, speech and thought processing grossly intact ? ?ASSESSMENT AND PLAN: ?Covid-19 infection, we will treat with 5 days of Paxlovid. For the joint pains, we will stop Diclofenac and try Meloxicam 15 mg daily.  ?[name], MD ? ?Discussed the following assessment and plan: ? ?No diagnosis found. ? ? ?  ?I discussed the assessment and treatment plan with the patient. The patient was provided an opportunity to ask questions and all were answered. The patient agreed with the plan and demonstrated an understanding of the instructions. ?  ?The patient was advised to call back or seek an in-person evaluation if the symptoms worsen or if the condition fails to improve as anticipated. ? ?  ? ? ?Review of Systems ? ?   ?Objective:  ? Physical Exam ? ? ? ? ?   ?Assessment & Plan:  ? ? ?

## 2021-05-31 NOTE — Telephone Encounter (Signed)
Pt had an appointment with Dr Sarajane Jews on 05/29/2021 ?

## 2021-06-06 ENCOUNTER — Telehealth (INDEPENDENT_AMBULATORY_CARE_PROVIDER_SITE_OTHER): Payer: Medicare HMO | Admitting: Family Medicine

## 2021-06-06 ENCOUNTER — Encounter: Payer: Self-pay | Admitting: Family Medicine

## 2021-06-06 VITALS — BP 145/82 | HR 81 | Temp 99.2°F

## 2021-06-06 DIAGNOSIS — U071 COVID-19: Secondary | ICD-10-CM

## 2021-06-06 MED ORDER — BENZONATATE 100 MG PO CAPS: ORAL_CAPSULE | ORAL | 0 refills | Status: DC

## 2021-06-06 MED ORDER — PREDNISONE 20 MG PO TABS: 40.0000 mg | ORAL_TABLET | Freq: Every day | ORAL | 0 refills | Status: DC

## 2021-06-06 NOTE — Patient Instructions (Signed)
-  I sent the medication(s) we discussed to your pharmacy: ?Meds ordered this encounter  ?Medications  ? benzonatate (TESSALON PERLES) 100 MG capsule  ?  Sig: 1-2 capsules up to twice daily as needed cough  ?  Dispense:  30 capsule  ?  Refill:  0  ? predniSONE (DELTASONE) 20 MG tablet  ?  Sig: Take 2 tablets (40 mg total) by mouth daily with breakfast.  ?  Dispense:  8 tablet  ?  Refill:  0  ? ? ? ?I hope you are feeling better soon! ? ?Seek in person care promptly if your symptoms worsen, new concerns arise or you are not improving with treatment. ? ?It was nice to meet you today. I help [facility] out with telemedicine visits on Tuesdays and Thursdays and am happy to help if you need a virtual follow up visit on those days. Otherwise, if you have any concerns or questions following this visit please schedule a follow up visit with your Primary Care office or seek care at a local urgent care clinic to avoid delays in care ? ?

## 2021-06-06 NOTE — Progress Notes (Signed)
Virtual Visit via Video Note ? ?I connected with [name] ? on 06/06/21 at 10:40 AM EDT by a video enabled telemedicine application and verified that I am speaking with the correct person using two identifiers. ? Location patient:  ?Location provider:work or home office ?Persons participating in the virtual visit: patient, provider ? ?I discussed the limitations and requested verbal permission for telemedicine visit. The patient expressed understanding and agreed to proceed. ? ? ?HPI: ? ?Acute telemedicine visit for Covid19: ?-Onset: about 10-11 days ago ?-dx with covid and took Paxlovid ?-Symptoms include: was feeling better, but now with some nasal congestion and cough again, temp around 99, feels tired at times, has some wheezing at times and has required steroids in the past and feels is at that point ?-Denies:fever over 100, CP, SOB, excessive mucus production ?-O2 has been 98-99 ?-Pertinent past medical history: see below, has a hx of bronchitis and has seen lung doc in the past ?-Pertinent medication allergies: ?Allergies  ?Allergen Reactions  ? Flexeril [Cyclobenzaprine]   ?  Weird feeling  ? Oxycodone-Acetaminophen Nausea Only  ?  dizziness  ? Oxycodone-Acetaminophen Nausea Only and Other (See Comments)  ?  dizziness ?Weird feeling;  Hydrocodone on home med list 7/24 ?dizziness  ? ?-COVID-19 vaccine status:  ?Immunization History  ?Administered Date(s) Administered  ? Influenza,inj,Quad PF,6+ Mos 11/28/2016, 01/22/2018, 12/22/2018, 01/20/2021  ? Influenza-Unspecified 12/22/2018, 01/23/2019  ? PFIZER(Purple Top)SARS-COV-2 Vaccination 10/09/2019, 11/03/2019  ? Tdap 11/28/2016  ? ? ? ?ROS: See pertinent positives and negatives per HPI. ? ?Past Medical History:  ?Diagnosis Date  ? Arthritis   ? Cancer [facility])   ? prostate, sees Dr. Risa Grill   ? Colon polyps   ? Degenerative disc disease, cervical   ? Degenerative disc disease, lumbar   ? Depression   ? GERD (gastroesophageal reflux disease)   ? Low back pain   ?  Osteopenia   ? ? ?Past Surgical History:  ?Procedure Laterality Date  ? CERVICAL FUSION  2015  ? C3 through C7, per Dr. Harl Bowie   ? COLONOSCOPY  03/26/2018  ?  per Dr. Alessandra Bevels, adenomatous polyps, repeat in 3 yrs   ? CYSTECTOMY    ? benign, right hip  ? GYNECOMASTIA EXCISION    ? bilateral, reduction  ? HIP SURGERY Right   ? 12/2020  ? JOINT REPLACEMENT  12/20/2011  ? hx. LTHA, now RTHA planned  ? LAMINECTOMY  04/2007  ? lumbar  ? LUMBAR FUSION  2015  ? L4 through S1, per Dr. Harl Bowie   ? LUMBAR MICRODISCECTOMY  10/15/2008  ? L-4-5, per Dr. Jenne Campus at [facility]  ? PROSTATE SURGERY  12/13/2010  ? robotic prostatectomy per Dr. Risa Grill   ? TOTAL HIP ARTHROPLASTY  12/20/2011  ? 2010 left hip  ? TOTAL HIP ARTHROPLASTY  12/26/2011  ? Procedure: TOTAL HIP ARTHROPLASTY;  Surgeon: Gearlean Alf, MD;  Location: [facility];  Service: Orthopedics;  Laterality: Right;  ? ? ? ?Current Outpatient Medications:  ?  anastrozole (ARIMIDEX) 1 MG tablet, TAKE 1 TABLET BY MOUTH EVERY DAY, Disp: 90 tablet, Rfl: 0 ?  benzonatate (TESSALON PERLES) 100 MG capsule, 1-2 capsules up to twice daily as needed cough, Disp: 30 capsule, Rfl: 0 ?  Cholecalciferol (VITAMIN D) 125 MCG (5000 UT) CAPS, Take 5,000 Units by mouth daily., Disp: , Rfl:  ?  lisinopril (ZESTRIL) 20 MG tablet, TAKE 1 TABLET BY MOUTH EVERY DAY, Disp: 90 tablet, Rfl: 1 ?  meloxicam (MOBIC) 15 MG tablet, Take  1 tablet (15 mg total) by mouth daily., Disp: 90 tablet, Rfl: 3 ?  omeprazole (PRILOSEC) 40 MG capsule, TAKE 1 CAPSULE EVERY DAY, Disp: 90 capsule, Rfl: 3 ?  predniSONE (DELTASONE) 20 MG tablet, Take 2 tablets (40 mg total) by mouth daily with breakfast., Disp: 8 tablet, Rfl: 0 ?  testosterone cypionate (DEPOTESTOSTERONE CYPIONATE) 200 MG/ML injection, Inject 1 mL (200 mg total) into the muscle every 7 (seven) days., Disp: 10 mL, Rfl: 4 ?  traMADol (ULTRAM) 50 MG tablet, TAKE 2 TABLETS EVERY 6 HOURS AS NEEDED FOR PAIN, Disp: 240 tablet, Rfl:  5 ? ?EXAM: ? ?VITALS per patient if applicable: O2 64-33% RA, HR 80s - low 100s, T 99 ? ?GENERAL: alert, oriented, appears well and in no acute distress ? ?HEENT: atraumatic, conjunttiva clear, no obvious abnormalities on inspection of external nose and ears ? ?NECK: normal movements of the head and neck ? ?LUNGS: on inspection no signs of respiratory distress, breathing rate appears normal, no obvious gross SOB, gasping or wheezing ? ?CV: no obvious cyanosis ? ?MS: moves all visible extremities without noticeable abnormality ? ?PSYCH/NEURO: pleasant and cooperative, no obvious depression or anxiety, speech and thought processing grossly intact ? ?ASSESSMENT AND PLAN: ? ?Discussed the following assessment and plan: ? ?COVID-19 ? ?-we discussed possible serious and likely etiologies, options for evaluation and workup, limitations of telemedicine visit vs in person visit, treatment, treatment risks and precautions. Pt is agreeable to treatment via telemedicine at this moment. Ongoing symptoms with covid likely vs other. He denies discolored or excessive mucus production or fevers. Is having wheezing and excessive cough and he feels prednisone has helped in the past. He prefers to avoid alb due to hx elevated HR. Has opted to try prednisone burst and Tessalon for cough, nasal saline rinse and rest. Did advise could still be testing positive/contagious since ongoing symptoms.  ?Work/School slipped offered: declined ?Advised to seek prompt  in person care if worsening, new symptoms arise, or if is not improving with treatment as expected per our conversation of expected course. Discussed options for follow up care. Did let this patient know that I do telemedicine on Tuesdays and Thursdays for [facility] and those are the days I am logged into the system. Advised to schedule follow up visit with PCP, [place] virtual visits or [facility] if any further questions or concerns to avoid delays in care. ?  ?I discussed the assessment  and treatment plan with the patient. The patient was provided an opportunity to ask questions and all were answered. The patient agreed with the plan and demonstrated an understanding of the instructions. ?  ? ? ?Lucretia Kern, DO  ? ?

## 2021-06-09 ENCOUNTER — Telehealth: Payer: Self-pay | Admitting: Family Medicine

## 2021-06-09 ENCOUNTER — Other Ambulatory Visit: Payer: Self-pay | Admitting: Family Medicine

## 2021-06-09 MED ORDER — PREDNISONE 20 MG PO TABS: 40.0000 mg | ORAL_TABLET | Freq: Every day | ORAL | 0 refills | Status: DC

## 2021-06-09 NOTE — Telephone Encounter (Signed)
Recent Covid infection.   Treated initially with anti-virals.   Was placed on 4 days of prednisone.   Did feel that helped some- especially first couple of days.   Thinks he has some mild wheezing.   We discussed sending in another 4 days of prednisone 20 mg two daily.   ? ?Eulas Post MD ?[facility] ? ?

## 2021-06-13 ENCOUNTER — Telehealth (INDEPENDENT_AMBULATORY_CARE_PROVIDER_SITE_OTHER): Payer: Medicare HMO | Admitting: Family Medicine

## 2021-06-13 ENCOUNTER — Encounter: Payer: Self-pay | Admitting: Family Medicine

## 2021-06-13 VITALS — BP 138/77 | HR 92

## 2021-06-13 DIAGNOSIS — J40 Bronchitis, not specified as acute or chronic: Secondary | ICD-10-CM | POA: Diagnosis not present

## 2021-06-13 MED ORDER — LEVOFLOXACIN 500 MG PO TABS: 500.0000 mg | ORAL_TABLET | Freq: Every day | ORAL | 0 refills | Status: AC

## 2021-06-13 MED ORDER — BENZONATATE 100 MG PO CAPS: 200.0000 mg | ORAL_CAPSULE | Freq: Four times a day (QID) | ORAL | 0 refills | Status: DC | PRN

## 2021-06-13 NOTE — Progress Notes (Signed)
? ?Subjective:  ? ? Patient ID: [name], male    DOB: 1960-11-22, 61 y.o.   MRN: [id number] ? ?HPI ?Virtual Visit via Video Note ? ?I connected with the patient on 06/13/21 at  3:45 PM EDT by a video enabled telemedicine application and verified that I am speaking with the correct person using two identifiers. ? Location patient: home ?Location provider:work or home office ?Persons participating in the virtual visit: patient, provider ? ?I discussed the limitations of evaluation and management by telemedicine and the availability of in person appointments. The patient expressed understanding and agreed to proceed. ? ? ?HPI: ?Here for continued low grade fevers and coughing up yellow sputum. No chest pain or SOB. He tested positive for the Covid viruson 05-26-21. We saw him on 05-29-21 and gave him 5 days of Paxlovid. This helped somewhat but he was still coughing, so he saw Dr. Maudie Mercury on 06-06-21. She gave him Benzonatate and a course of Prednisone. This has not made much difference unfortunately. He still cough up yellow sputum and he is very fatigued.  ? ? ?ROS: See pertinent positives and negatives per HPI. ? ?Past Medical History:  ?Diagnosis Date  ? Arthritis   ? Cancer [facility])   ? prostate, sees Dr. Risa Grill   ? Colon polyps   ? Degenerative disc disease, cervical   ? Degenerative disc disease, lumbar   ? Depression   ? GERD (gastroesophageal reflux disease)   ? Low back pain   ? Osteopenia   ? ? ?Past Surgical History:  ?Procedure Laterality Date  ? CERVICAL FUSION  2015  ? C3 through C7, per Dr. Harl Bowie   ? COLONOSCOPY  03/26/2018  ?  per Dr. Alessandra Bevels, adenomatous polyps, repeat in 3 yrs   ? CYSTECTOMY    ? benign, right hip  ? GYNECOMASTIA EXCISION    ? bilateral, reduction  ? HIP SURGERY Right   ? 12/2020  ? JOINT REPLACEMENT  12/20/2011  ? hx. LTHA, now RTHA planned  ? LAMINECTOMY  04/2007  ? lumbar  ? LUMBAR FUSION  2015  ? L4 through S1, per Dr. Harl Bowie   ? LUMBAR MICRODISCECTOMY  10/15/2008  ? L-4-5, per  Dr. Jenne Campus at [facility]  ? PROSTATE SURGERY  12/13/2010  ? robotic prostatectomy per Dr. Risa Grill   ? TOTAL HIP ARTHROPLASTY  12/20/2011  ? 2010 left hip  ? TOTAL HIP ARTHROPLASTY  12/26/2011  ? Procedure: TOTAL HIP ARTHROPLASTY;  Surgeon: Gearlean Alf, MD;  Location: [facility];  Service: Orthopedics;  Laterality: Right;  ? ? ?Family History  ?Problem Relation Age of Onset  ? Hypertension Father   ? Suicidality Father   ? Healthy Mother   ? Arthritis Other   ? Cancer Brother   ?     Bone cancer  ? ? ? ?Current Outpatient Medications:  ?  levofloxacin (LEVAQUIN) 500 MG tablet, Take 1 tablet (500 mg total) by mouth daily for 10 days., Disp: 10 tablet, Rfl: 0 ?  anastrozole (ARIMIDEX) 1 MG tablet, TAKE 1 TABLET BY MOUTH EVERY DAY, Disp: 90 tablet, Rfl: 0 ?  benzonatate (TESSALON PERLES) 100 MG capsule, Take 2 capsules (200 mg total) by mouth every 6 (six) hours as needed for cough. 1-2 capsules up to twice daily as needed cough, Disp: 60 capsule, Rfl: 0 ?  Cholecalciferol (VITAMIN D) 125 MCG (5000 UT) CAPS, Take 5,000 Units by mouth daily., Disp: , Rfl:  ?  lisinopril (ZESTRIL) 20 MG tablet, TAKE  1 TABLET BY MOUTH EVERY DAY, Disp: 90 tablet, Rfl: 1 ?  meloxicam (MOBIC) 15 MG tablet, Take 1 tablet (15 mg total) by mouth daily., Disp: 90 tablet, Rfl: 3 ?  omeprazole (PRILOSEC) 40 MG capsule, TAKE 1 CAPSULE EVERY DAY, Disp: 90 capsule, Rfl: 3 ?  testosterone cypionate (DEPOTESTOSTERONE CYPIONATE) 200 MG/ML injection, Inject 1 mL (200 mg total) into the muscle every 7 (seven) days., Disp: 10 mL, Rfl: 4 ?  traMADol (ULTRAM) 50 MG tablet, TAKE 2 TABLETS EVERY 6 HOURS AS NEEDED FOR PAIN, Disp: 240 tablet, Rfl: 1 ? ?EXAM: ? ?VITALS per patient if applicable: ? ?GENERAL: alert, oriented, appears well and in no acute distress ? ?HEENT: atraumatic, conjunttiva clear, no obvious abnormalities on inspection of external nose and ears ? ?NECK: normal movements of the head and neck ? ?LUNGS: on inspection no signs of  respiratory distress, breathing rate appears normal, no obvious gross SOB, gasping or wheezing ? ?CV: no obvious cyanosis ? ?MS: moves all visible extremities without noticeable abnormality ? ?PSYCH/NEURO: pleasant and cooperative, no obvious depression or anxiety, speech and thought processing grossly intact ? ?ASSESSMENT AND PLAN: ?He now has a secondary bronchitis after the Covid infection. We will treat this with 10 days of Levaquin. Recheck as needed.  ?[name], MD ? ?Discussed the following assessment and plan: ? ?No diagnosis found. ? ? ?  ?I discussed the assessment and treatment plan with the patient. The patient was provided an opportunity to ask questions and all were answered. The patient agreed with the plan and demonstrated an understanding of the instructions. ?  ?The patient was advised to call back or seek an in-person evaluation if the symptoms worsen or if the condition fails to improve as anticipated. ? ?  ? ? ?Review of Systems ? ?   ?Objective:  ? Physical Exam ? ? ? ? ?   ?Assessment & Plan:  ? ? ?

## 2021-06-13 NOTE — Telephone Encounter (Signed)
Last refill- 11/23/20--  240 tabs, 5 refills ?Last VV with Dr. Maudie Mercury- 06/06/2021 ? ?Next VV today 06/13/2021 ?

## 2021-06-19 ENCOUNTER — Encounter: Payer: Self-pay | Admitting: Family Medicine

## 2021-06-19 ENCOUNTER — Ambulatory Visit (INDEPENDENT_AMBULATORY_CARE_PROVIDER_SITE_OTHER): Payer: Medicare HMO | Admitting: Family Medicine

## 2021-06-19 VITALS — BP 132/80 | HR 99 | Temp 98.5°F | Wt 246.0 lb

## 2021-06-19 DIAGNOSIS — K439 Ventral hernia without obstruction or gangrene: Secondary | ICD-10-CM | POA: Diagnosis not present

## 2021-06-19 DIAGNOSIS — J40 Bronchitis, not specified as acute or chronic: Secondary | ICD-10-CM | POA: Diagnosis not present

## 2021-06-19 NOTE — Progress Notes (Signed)
? ?  Subjective:  ? ? Patient ID: [name], male    DOB: 01-10-1961, 61 y.o.   MRN: [id number] ? ?HPI ?Here to follow up on post-Covid coughing. He tested positive for the Covid-19 virus on 05-26-21, and he was treated with Paxlovid. On 06-06-21 he had a video visit and was given Benzonatate and Prednisone. Then on 06-13-21 we started him on 10 days of Levaquin. He still felt bad until this past weekend, but then he improved. Today he feels much better but he still wants [place] to check him. In addition, last week when he was doing all the coughing, he felt a knot appear on his lower abdomen. This can be quite painful at times. His BMs are normal.  ? ? ?Review of Systems  ?Constitutional: Negative.   ?HENT: Negative.    ?Eyes: Negative.   ?Respiratory:  Positive for cough. Negative for wheezing.   ?Cardiovascular: Negative.   ?Gastrointestinal:  Positive for abdominal pain. Negative for abdominal distention, blood in stool, constipation, diarrhea, nausea and vomiting.  ? ?   ?Objective:  ? Physical Exam ?Constitutional:   ?   Appearance: Normal appearance. He is not ill-appearing.  ?Cardiovascular:  ?   Rate and Rhythm: Normal rate and regular rhythm.  ?   Pulses: Normal pulses.  ?   Heart sounds: Normal heart sounds.  ?Pulmonary:  ?   Effort: Pulmonary effort is normal.  ?   Breath sounds: Normal breath sounds.  ?Abdominal:  ?   General: Abdomen is flat. Bowel sounds are normal. There is no distension.  ?   Tenderness: There is no guarding or rebound.  ?   Comments: There is a small firm tender mass on the lower abdomen midway between the umbilicus and the pubis. This is more pronounced when he does a Valsalva maneuver   ?Neurological:  ?   Mental Status: He is alert.  ? ? ? ? ? ?   ?Assessment & Plan:  ?He had a post-Covid bronchitis that is responding nicely to a course of Levaquin. He will finish this up and recheck only as needed. He also has what may be a fat hernia on the abdomen. We will refer him to Surgery to  evaluate this.  ?[name], MD ? ? ?

## 2021-07-04 ENCOUNTER — Other Ambulatory Visit: Payer: Self-pay | Admitting: Family Medicine

## 2021-07-17 DIAGNOSIS — G4733 Obstructive sleep apnea (adult) (pediatric): Secondary | ICD-10-CM | POA: Diagnosis not present

## 2021-07-25 ENCOUNTER — Encounter: Payer: Self-pay | Admitting: Family Medicine

## 2021-08-02 DIAGNOSIS — R102 Pelvic and perineal pain: Secondary | ICD-10-CM | POA: Diagnosis not present

## 2021-08-02 DIAGNOSIS — K429 Umbilical hernia without obstruction or gangrene: Secondary | ICD-10-CM | POA: Diagnosis not present

## 2021-08-02 DIAGNOSIS — K5900 Constipation, unspecified: Secondary | ICD-10-CM | POA: Diagnosis not present

## 2021-08-09 ENCOUNTER — Other Ambulatory Visit: Payer: Self-pay | Admitting: General Surgery

## 2021-08-09 DIAGNOSIS — R102 Pelvic and perineal pain: Secondary | ICD-10-CM

## 2021-08-28 ENCOUNTER — Other Ambulatory Visit: Payer: Self-pay | Admitting: Family Medicine

## 2021-08-28 NOTE — Telephone Encounter (Signed)
Dr. Claris Che patient  Last refill-02/02/21 Last OV- 06/19/21 Last Testosterone labs- 01/20/21  No future OV scheduled.

## 2021-09-01 ENCOUNTER — Ambulatory Visit
Admission: RE | Admit: 2021-09-01 | Discharge: 2021-09-01 | Disposition: A | Discharge status: Home / Self Care | Payer: Medicare HMO | Source: Ambulatory Visit | Attending: General Surgery | Admitting: General Surgery

## 2021-09-01 DIAGNOSIS — M25552 Pain in left hip: Secondary | ICD-10-CM | POA: Diagnosis not present

## 2021-09-01 DIAGNOSIS — Z981 Arthrodesis status: Secondary | ICD-10-CM | POA: Diagnosis not present

## 2021-09-01 DIAGNOSIS — R102 Pelvic and perineal pain: Secondary | ICD-10-CM

## 2021-09-01 DIAGNOSIS — R1900 Intra-abdominal and pelvic swelling, mass and lump, unspecified site: Secondary | ICD-10-CM | POA: Diagnosis not present

## 2021-09-01 DIAGNOSIS — Z8546 Personal history of malignant neoplasm of prostate: Secondary | ICD-10-CM | POA: Diagnosis not present

## 2021-09-01 DIAGNOSIS — K5730 Diverticulosis of large intestine without perforation or abscess without bleeding: Secondary | ICD-10-CM | POA: Diagnosis not present

## 2021-09-01 MED ORDER — IOPAMIDOL (ISOVUE-300) INJECTION 61%
100.0000 mL | Freq: Once | INTRAVENOUS | Status: AC | PRN
  Administered 2021-09-01: 100 mL via INTRAVENOUS

## 2021-09-02 DIAGNOSIS — M25552 Pain in left hip: Secondary | ICD-10-CM | POA: Diagnosis not present

## 2021-09-25 ENCOUNTER — Other Ambulatory Visit: Payer: Self-pay | Admitting: Family Medicine

## 2021-09-25 DIAGNOSIS — M48061 Spinal stenosis, lumbar region without neurogenic claudication: Secondary | ICD-10-CM

## 2021-09-29 ENCOUNTER — Other Ambulatory Visit: Payer: Self-pay | Admitting: Family Medicine

## 2021-09-29 DIAGNOSIS — M25552 Pain in left hip: Secondary | ICD-10-CM | POA: Diagnosis not present

## 2021-09-29 DIAGNOSIS — M7061 Trochanteric bursitis, right hip: Secondary | ICD-10-CM | POA: Diagnosis not present

## 2021-10-03 ENCOUNTER — Encounter: Payer: Self-pay | Admitting: Family Medicine

## 2021-10-03 ENCOUNTER — Ambulatory Visit (INDEPENDENT_AMBULATORY_CARE_PROVIDER_SITE_OTHER): Payer: Medicare HMO | Admitting: Family Medicine

## 2021-10-03 VITALS — BP 118/68 | HR 81 | Temp 98.7°F | Wt 237.0 lb

## 2021-10-03 DIAGNOSIS — I10 Essential (primary) hypertension: Secondary | ICD-10-CM

## 2021-10-03 DIAGNOSIS — E291 Testicular hypofunction: Secondary | ICD-10-CM | POA: Diagnosis not present

## 2021-10-03 DIAGNOSIS — E785 Hyperlipidemia, unspecified: Secondary | ICD-10-CM | POA: Diagnosis not present

## 2021-10-03 DIAGNOSIS — R739 Hyperglycemia, unspecified: Secondary | ICD-10-CM | POA: Diagnosis not present

## 2021-10-03 LAB — HEMOGLOBIN A1C: Hgb A1c MFr Bld: 5.2 % (ref 4.6–6.5)

## 2021-10-03 LAB — BASIC METABOLIC PANEL
BUN: 15 mg/dL (ref 6–23)
CO2: 29 mEq/L (ref 19–32)
Calcium: 9.6 mg/dL (ref 8.4–10.5)
Chloride: 101 mEq/L (ref 96–112)
Creatinine, Ser: 0.96 mg/dL (ref 0.40–1.50)
GFR: 86.53 mL/min (ref >60.00)
Glucose, Bld: 94 mg/dL (ref 70–99)
Potassium: 4.6 mEq/L (ref 3.5–5.1)
Sodium: 139 mEq/L (ref 135–145)

## 2021-10-03 LAB — HEPATIC FUNCTION PANEL
ALT: 43 U/L (ref 0–53)
AST: 30 U/L (ref 0–37)
Albumin: 4.6 g/dL (ref 3.5–5.2)
Alkaline Phosphatase: 83 U/L (ref 39–117)
Bilirubin, Direct: 0.1 mg/dL (ref 0.0–0.3)
Total Bilirubin: 0.7 mg/dL (ref 0.2–1.2)
Total Protein: 7.2 g/dL (ref 6.0–8.3)

## 2021-10-03 LAB — CBC WITH DIFFERENTIAL/PLATELET
Basophils Absolute: 0.1 10*3/uL (ref 0.0–0.1)
Basophils Relative: 1 % (ref 0.0–3.0)
Eosinophils Absolute: 0 10*3/uL (ref 0.0–0.7)
Eosinophils Relative: 0.7 % (ref 0.0–5.0)
HCT: 42.9 % (ref 39.0–52.0)
Hemoglobin: 13.7 g/dL (ref 13.0–17.0)
Lymphocytes Relative: 14.3 % (ref 12.0–46.0)
Lymphs Abs: 0.8 10*3/uL (ref 0.7–4.0)
MCHC: 31.9 g/dL (ref 30.0–36.0)
MCV: 76.5 fl — ABNORMAL LOW (ref 78.0–100.0)
Monocytes Absolute: 0.8 10*3/uL (ref 0.1–1.0)
Monocytes Relative: 12.7 % — ABNORMAL HIGH (ref 3.0–12.0)
Neutro Abs: 4.2 10*3/uL (ref 1.4–7.7)
Neutrophils Relative %: 71.3 % (ref 43.0–77.0)
Platelets: 303 10*3/uL (ref 150.0–400.0)
RBC: 5.61 Mil/uL (ref 4.22–5.81)
RDW: 20 % — ABNORMAL HIGH (ref 11.5–15.5)
WBC: 5.9 10*3/uL (ref 4.0–10.5)

## 2021-10-03 LAB — IBC + FERRITIN
Ferritin: 21.5 ng/mL — ABNORMAL LOW (ref 22.0–322.0)
Iron: 24 ug/dL — ABNORMAL LOW (ref 42–165)
Saturation Ratios: 4.3 % — ABNORMAL LOW (ref 20.0–50.0)
TIBC: 560 ug/dL — ABNORMAL HIGH (ref 250.0–450.0)
Transferrin: 400 mg/dL — ABNORMAL HIGH (ref 212.0–360.0)

## 2021-10-03 LAB — LIPID PANEL
Cholesterol: 216 mg/dL — ABNORMAL HIGH (ref 0–200)
HDL: 31.7 mg/dL — ABNORMAL LOW (ref >39.00)
LDL Cholesterol: 166 mg/dL — ABNORMAL HIGH (ref 0–99)
NonHDL: 184.68
Total CHOL/HDL Ratio: 7
Triglycerides: 95 mg/dL (ref 0.0–149.0)
VLDL: 19 mg/dL (ref 0.0–40.0)

## 2021-10-03 LAB — TSH: TSH: 1.44 u[IU]/mL (ref 0.35–5.50)

## 2021-10-03 LAB — PSA: PSA: 0 ng/mL — ABNORMAL LOW (ref 0.10–4.00)

## 2021-10-03 LAB — TESTOSTERONE: Testosterone: 960.66 ng/dL — ABNORMAL HIGH (ref 300.00–890.00)

## 2021-10-03 LAB — IRON: Iron: 24 ug/dL — ABNORMAL LOW (ref 42–165)

## 2021-10-03 MED ORDER — LISINOPRIL 20 MG PO TABS
10.0000 mg | ORAL_TABLET | Freq: Every day | ORAL | 1 refills | Status: DC
Start: 2021-10-03 — End: 2021-11-13

## 2021-10-03 NOTE — Progress Notes (Signed)
   Subjective:    Patient ID: RHYLAND HINDERLITER, male    DOB: 02/08/1961, 61 y.o.   MRN: [id number]  HPI Here to follow up on HTN and to check labs. He is fasting. He feels well. His BP at home has been  well controlled.    Review of Systems  Constitutional: Negative.   Respiratory: Negative.    Cardiovascular: Negative.        Objective:   Physical Exam Constitutional:      Appearance: Normal appearance.  Cardiovascular:     Rate and Rhythm: Normal rate and regular rhythm.     Pulses: Normal pulses.     Heart sounds: Normal heart sounds.  Pulmonary:     Effort: Pulmonary effort is normal.     Breath sounds: Normal breath sounds.  Neurological:     Mental Status: He is alert.           Assessment & Plan:  His HTN is well controlled. He will cut the Lisinopril in half and take 10 mg daily. Get fasting labs to check lipids, etc.  [name], MD

## 2021-10-04 LAB — ESTRADIOL: Estradiol: 21 pg/mL (ref <=39)

## 2021-11-07 ENCOUNTER — Other Ambulatory Visit: Payer: Self-pay | Admitting: Family Medicine

## 2021-11-08 ENCOUNTER — Other Ambulatory Visit: Payer: Self-pay | Admitting: Family Medicine

## 2021-11-13 ENCOUNTER — Other Ambulatory Visit: Payer: Self-pay | Admitting: Family Medicine

## 2021-11-13 ENCOUNTER — Other Ambulatory Visit: Payer: Self-pay

## 2021-11-13 ENCOUNTER — Encounter: Payer: Self-pay | Admitting: Family Medicine

## 2021-11-13 DIAGNOSIS — I10 Essential (primary) hypertension: Secondary | ICD-10-CM

## 2021-11-13 MED ORDER — LISINOPRIL 20 MG PO TABS: 10.0000 mg | ORAL_TABLET | Freq: Every day | ORAL | 0 refills | Status: DC

## 2021-11-14 NOTE — Telephone Encounter (Signed)
Last OV-10-03-2021 Last refill---06/13/2021-240 tabs, 1 refill  No future OV scheduled.

## 2021-11-17 ENCOUNTER — Encounter: Payer: Self-pay | Admitting: Family Medicine

## 2021-11-17 ENCOUNTER — Ambulatory Visit (INDEPENDENT_AMBULATORY_CARE_PROVIDER_SITE_OTHER): Payer: Medicare HMO | Admitting: Family Medicine

## 2021-11-17 VITALS — BP 112/68 | HR 88 | Temp 98.1°F | Ht 70.0 in | Wt 235.0 lb

## 2021-11-17 DIAGNOSIS — I4891 Unspecified atrial fibrillation: Secondary | ICD-10-CM

## 2021-11-17 DIAGNOSIS — M48061 Spinal stenosis, lumbar region without neurogenic claudication: Secondary | ICD-10-CM

## 2021-11-17 DIAGNOSIS — R29898 Other symptoms and signs involving the musculoskeletal system: Secondary | ICD-10-CM

## 2021-11-17 DIAGNOSIS — I10 Essential (primary) hypertension: Secondary | ICD-10-CM

## 2021-11-17 NOTE — Progress Notes (Signed)
   Subjective:    Patient ID: [name], male    DOB: 1960-08-09, 61 y.o.   MRN: [id number]  HPI Here to follow up on BP and to fill out disability papers. He needs forms fill ed yearly from his insurance carrier. Over the past few weeks we had decreased his Lisinopril to 10 mg daily, but he found that the BP went up. Now he is back on a full 20 mg tablet daily, and ths BP is well controlled. He feels well.    Review of Systems  Constitutional: Negative.   Respiratory: Negative.    Cardiovascular: Negative.   Musculoskeletal:  Positive for back pain.       Objective:   Physical Exam Constitutional:      Appearance: Normal appearance.  Cardiovascular:     Rate and Rhythm: Normal rate and regular rhythm.     Pulses: Normal pulses.     Heart sounds: Normal heart sounds.  Pulmonary:     Effort: Pulmonary effort is normal.     Breath sounds: Normal breath sounds.  Neurological:     Mental Status: He is alert.           Assessment & Plan:  His HTN is now stable so we will maintain the current medications. We filled out the disability forms about his back pain. [name], MD

## 2021-11-20 DIAGNOSIS — M438X4 Other specified deforming dorsopathies, thoracic region: Secondary | ICD-10-CM | POA: Diagnosis not present

## 2021-11-20 DIAGNOSIS — M546 Pain in thoracic spine: Secondary | ICD-10-CM | POA: Diagnosis not present

## 2021-11-20 DIAGNOSIS — M5134 Other intervertebral disc degeneration, thoracic region: Secondary | ICD-10-CM | POA: Diagnosis not present

## 2021-11-20 DIAGNOSIS — Z96643 Presence of artificial hip joint, bilateral: Secondary | ICD-10-CM | POA: Diagnosis not present

## 2021-11-20 DIAGNOSIS — Z981 Arthrodesis status: Secondary | ICD-10-CM | POA: Diagnosis not present

## 2021-12-07 ENCOUNTER — Encounter: Payer: Self-pay | Admitting: Family Medicine

## 2021-12-08 ENCOUNTER — Other Ambulatory Visit: Payer: Self-pay

## 2021-12-08 DIAGNOSIS — I10 Essential (primary) hypertension: Secondary | ICD-10-CM

## 2021-12-08 MED ORDER — LISINOPRIL 20 MG PO TABS: 20.0000 mg | ORAL_TABLET | Freq: Every day | ORAL | 0 refills | Status: DC

## 2021-12-08 MED ORDER — DICLOFENAC SODIUM 75 MG PO TBEC: 75.0000 mg | DELAYED_RELEASE_TABLET | Freq: Two times a day (BID) | ORAL | 1 refills | Status: DC

## 2021-12-08 NOTE — Telephone Encounter (Signed)
Send refills for both these meds for one year to [facility]

## 2021-12-11 ENCOUNTER — Other Ambulatory Visit: Payer: Self-pay

## 2021-12-11 DIAGNOSIS — I10 Essential (primary) hypertension: Secondary | ICD-10-CM

## 2021-12-11 MED ORDER — LISINOPRIL 20 MG PO TABS: 20.0000 mg | ORAL_TABLET | Freq: Every day | ORAL | 1 refills | Status: DC

## 2021-12-12 ENCOUNTER — Encounter: Payer: Self-pay | Admitting: Family Medicine

## 2021-12-14 ENCOUNTER — Encounter: Payer: Self-pay | Admitting: Family Medicine

## 2021-12-14 ENCOUNTER — Telehealth (INDEPENDENT_AMBULATORY_CARE_PROVIDER_SITE_OTHER): Payer: Medicare HMO | Admitting: Family Medicine

## 2021-12-14 DIAGNOSIS — J029 Acute pharyngitis, unspecified: Secondary | ICD-10-CM

## 2021-12-14 DIAGNOSIS — J020 Streptococcal pharyngitis: Secondary | ICD-10-CM | POA: Diagnosis not present

## 2021-12-14 DIAGNOSIS — R0981 Nasal congestion: Secondary | ICD-10-CM | POA: Diagnosis not present

## 2021-12-14 DIAGNOSIS — U071 COVID-19: Secondary | ICD-10-CM | POA: Diagnosis not present

## 2021-12-14 LAB — POC COVID19 BINAXNOW: SARS Coronavirus 2 Ag: POSITIVE — AB

## 2021-12-14 LAB — POCT INFLUENZA A/B
Influenza A, POC: NEGATIVE
Influenza B, POC: NEGATIVE

## 2021-12-14 LAB — POCT RAPID STREP A (OFFICE): Rapid Strep A Screen: POSITIVE — AB

## 2021-12-14 MED ORDER — HYDROCODONE BIT-HOMATROP MBR 5-1.5 MG/5ML PO SOLN: 5.0000 mL | ORAL | 0 refills | Status: DC | PRN

## 2021-12-14 MED ORDER — CEFUROXIME AXETIL 500 MG PO TABS: 500.0000 mg | ORAL_TABLET | Freq: Two times a day (BID) | ORAL | 0 refills | Status: AC

## 2021-12-14 NOTE — Progress Notes (Signed)
Subjective:    Patient ID: [name], male    DOB: April 15, 1960, 61 y.o.   MRN: [id number]  HPI Virtual Visit via Video Note  I connected with the patient on 12/14/21 at 10:45 AM EDT by a video enabled telemedicine application and verified that I am speaking with the correct person using two identifiers.  Location patient: home Location provider:work or home office Persons participating in the virtual visit: patient, provider  I discussed the limitations of evaluation and management by telemedicine and the availability of in person appointments. The patient expressed understanding and agreed to proceed.   HPI: Here for 5 days of a dry cough, a ST, and some fatigue. No fever or SOB. Drinking fluids .   ROS: See pertinent positives and negatives per HPI.  Past Medical History:  Diagnosis Date   Arthritis    Cancer [facility])    prostate, sees Dr. Risa Grill    Colon polyps    Degenerative disc disease, cervical    Degenerative disc disease, lumbar    Depression    GERD (gastroesophageal reflux disease)    Low back pain    Osteopenia     Past Surgical History:  Procedure Laterality Date   CERVICAL FUSION  2015   C3 through C7, per Dr. Harl Bowie    COLONOSCOPY  03/26/2018    per Dr. Alessandra Bevels, adenomatous polyps, repeat in 3 yrs    CYSTECTOMY     benign, right hip   GYNECOMASTIA EXCISION     bilateral, reduction   HIP SURGERY Right    12/2020   JOINT REPLACEMENT  12/20/2011   hx. LTHA, now RTHA planned   LAMINECTOMY  04/2007   lumbar   LUMBAR FUSION  2015   L4 through S1, per Dr. Harl Bowie    LUMBAR MICRODISCECTOMY  10/15/2008   L-4-5, per Dr. Jenne Campus at [facility]  12/13/2010   robotic prostatectomy per Dr. Risa Grill    TOTAL HIP ARTHROPLASTY  12/20/2011   2010 left hip   TOTAL HIP ARTHROPLASTY  12/26/2011   Procedure: TOTAL HIP ARTHROPLASTY;  Surgeon: Gearlean Alf, MD;  Location: [facility];  Service: Orthopedics;  Laterality: Right;     Family History  Problem Relation Age of Onset   Hypertension Father    Suicidality Father    Healthy Mother    Arthritis Other    Cancer Brother        Bone cancer     Current Outpatient Medications:    anastrozole (ARIMIDEX) 1 MG tablet, TAKE 1 TABLET BY MOUTH EVERY DAY, Disp: 90 tablet, Rfl: 0   Cholecalciferol (VITAMIN D) 125 MCG (5000 UT) CAPS, Take 5,000 Units by mouth daily., Disp: , Rfl:    diclofenac (VOLTAREN) 75 MG EC tablet, Take 1 tablet (75 mg total) by mouth 2 (two) times daily., Disp: 120 tablet, Rfl: 1   lisinopril (ZESTRIL) 20 MG tablet, Take 1 tablet (20 mg total) by mouth daily., Disp: 90 tablet, Rfl: 1   omeprazole (PRILOSEC) 40 MG capsule, TAKE 1 CAPSULE EVERY DAY, Disp: 90 capsule, Rfl: 3   testosterone cypionate (DEPOTESTOSTERONE CYPIONATE) 200 MG/ML injection, INJECT 1 ML INTO THE MUSCLE EVERY 7 DAYS., Disp: 10 mL, Rfl: 3   traMADol (ULTRAM) 50 MG tablet, TAKE 2 TABLETS EVERY 6 HOURS AS NEEDED FOR PAIN, Disp: 240 tablet, Rfl: 1  EXAM:  VITALS per patient if applicable:  GENERAL: alert, oriented, appears well and in no acute distress  HEENT: atraumatic,  conjunttiva clear, no obvious abnormalities on inspection of external nose and ears  NECK: normal movements of the head and neck  LUNGS: on inspection no signs of respiratory distress, breathing rate appears normal, no obvious gross SOB, gasping or wheezing  CV: no obvious cyanosis  MS: moves all visible extremities without noticeable abnormality  PSYCH/NEURO: pleasant and cooperative, no obvious depression or anxiety, speech and thought processing grossly intact  ASSESSMENT AND PLAN: He has an acute infection with both Covid-19 and strep. For the strep pharyngitis, we will treat with 10 days of Cefuroxime. For the Covid, he will simply rest and give it time. Recheck as needed.  [name], MD  Discussed the following assessment and plan:  Sore throat - Plan: POC Rapid Strep A  Nasal  congestion - Plan: POC COVID-19, POC Influenza A/B     I discussed the assessment and treatment plan with the patient. The patient was provided an opportunity to ask questions and all were answered. The patient agreed with the plan and demonstrated an understanding of the instructions.   The patient was advised to call back or seek an in-person evaluation if the symptoms worsen or if the condition fails to improve as anticipated.      Review of Systems     Objective:   Physical Exam        Assessment & Plan:

## 2021-12-20 ENCOUNTER — Telehealth (INDEPENDENT_AMBULATORY_CARE_PROVIDER_SITE_OTHER): Payer: Medicare HMO | Admitting: Family Medicine

## 2021-12-20 ENCOUNTER — Encounter: Payer: Self-pay | Admitting: Family Medicine

## 2021-12-20 DIAGNOSIS — J020 Streptococcal pharyngitis: Secondary | ICD-10-CM

## 2021-12-20 DIAGNOSIS — U071 COVID-19: Secondary | ICD-10-CM | POA: Diagnosis not present

## 2021-12-20 NOTE — Progress Notes (Signed)
Subjective:    Patient ID: [name], male    DOB: 1960/12/14, 61 y.o.   MRN: [id number]  HPI Virtual Visit via Video Note  I connected with the patient on 12/20/21 at  2:00 PM EDT by a video enabled telemedicine application and verified that I am speaking with the correct person using two identifiers.  Location patient: home Location provider:work or home office Persons participating in the virtual visit: patient, provider  I discussed the limitations of evaluation and management by telemedicine and the availability of in person appointments. The patient expressed understanding and agreed to proceed.   HPI: Here to discuss his symptoms. On 12-14-21 he saw [place] and he tested positive for both strep and Covid. We started him on 10 days of Cefuroxime, and he rested an drank fluids. He started feeling better so he worked out twice this week at his gym. Now he feels a little worse again and he is fatigued.    ROS: See pertinent positives and negatives per HPI.  Past Medical History:  Diagnosis Date   Arthritis    Cancer [facility])    prostate, sees Dr. Risa Grill    Colon polyps    Degenerative disc disease, cervical    Degenerative disc disease, lumbar    Depression    GERD (gastroesophageal reflux disease)    Low back pain    Osteopenia     Past Surgical History:  Procedure Laterality Date   CERVICAL FUSION  2015   C3 through C7, per Dr. Harl Bowie    COLONOSCOPY  03/26/2018    per Dr. Alessandra Bevels, adenomatous polyps, repeat in 3 yrs    CYSTECTOMY     benign, right hip   GYNECOMASTIA EXCISION     bilateral, reduction   HIP SURGERY Right    12/2020   JOINT REPLACEMENT  12/20/2011   hx. LTHA, now RTHA planned   LAMINECTOMY  04/2007   lumbar   LUMBAR FUSION  2015   L4 through S1, per Dr. Harl Bowie    LUMBAR MICRODISCECTOMY  10/15/2008   L-4-5, per Dr. Jenne Campus at [place]  12/13/2010   robotic prostatectomy per Dr. Risa Grill    TOTAL HIP  ARTHROPLASTY  12/20/2011   2010 left hip   TOTAL HIP ARTHROPLASTY  12/26/2011   Procedure: TOTAL HIP ARTHROPLASTY;  Surgeon: Gearlean Alf, MD;  Location: [facility];  Service: Orthopedics;  Laterality: Right;    Family History  Problem Relation Age of Onset   Hypertension Father    Suicidality Father    Healthy Mother    Arthritis Other    Cancer Brother        Bone cancer     Current Outpatient Medications:    anastrozole (ARIMIDEX) 1 MG tablet, TAKE 1 TABLET BY MOUTH EVERY DAY, Disp: 90 tablet, Rfl: 0   cefUROXime (CEFTIN) 500 MG tablet, Take 1 tablet (500 mg total) by mouth 2 (two) times daily with a meal for 10 days., Disp: 20 tablet, Rfl: 0   Cholecalciferol (VITAMIN D) 125 MCG (5000 UT) CAPS, Take 5,000 Units by mouth daily., Disp: , Rfl:    diclofenac (VOLTAREN) 75 MG EC tablet, Take 1 tablet (75 mg total) by mouth 2 (two) times daily., Disp: 120 tablet, Rfl: 1   HYDROcodone bit-homatropine (HYCODAN) 5-1.5 MG/5ML syrup, Take 5 mLs by mouth every 4 (four) hours as needed for cough., Disp: 240 mL, Rfl: 0   lisinopril (ZESTRIL) 20 MG tablet, Take 1  tablet (20 mg total) by mouth daily., Disp: 90 tablet, Rfl: 1   omeprazole (PRILOSEC) 40 MG capsule, TAKE 1 CAPSULE EVERY DAY, Disp: 90 capsule, Rfl: 3   testosterone cypionate (DEPOTESTOSTERONE CYPIONATE) 200 MG/ML injection, INJECT 1 ML INTO THE MUSCLE EVERY 7 DAYS., Disp: 10 mL, Rfl: 3   traMADol (ULTRAM) 50 MG tablet, TAKE 2 TABLETS EVERY 6 HOURS AS NEEDED FOR PAIN, Disp: 240 tablet, Rfl: 1  EXAM:  VITALS per patient if applicable:  GENERAL: alert, oriented, appears well and in no acute distress  HEENT: atraumatic, conjunttiva clear, no obvious abnormalities on inspection of external nose and ears  NECK: normal movements of the head and neck  LUNGS: on inspection no signs of respiratory distress, breathing rate appears normal, no obvious gross SOB, gasping or wheezing  CV: no obvious cyanosis  MS: moves all visible  extremities without noticeable abnormality  PSYCH/NEURO: pleasant and cooperative, no obvious depression or anxiety, speech and thought processing grossly intact  ASSESSMENT AND PLAN: He is recovering from infections with strep and Covid. He is halfway through a course of Cefuroxime, so he will finish this. I think his current symptoms are from the Covid infection. He probably starting working out too soon, and this set his recovery back a little. He will stay away from the gym until he feels better. Recheck as needed.  [name], MD  Discussed the following assessment and plan:  No diagnosis found.     I discussed the assessment and treatment plan with the patient. The patient was provided an opportunity to ask questions and all were answered. The patient agreed with the plan and demonstrated an understanding of the instructions.   The patient was advised to call back or seek an in-person evaluation if the symptoms worsen or if the condition fails to improve as anticipated.      Review of Systems     Objective:   Physical Exam        Assessment & Plan:

## 2021-12-27 ENCOUNTER — Encounter: Payer: Self-pay | Admitting: Family Medicine

## 2021-12-28 MED ORDER — METHYLPREDNISOLONE 4 MG PO TBPK: ORAL_TABLET | ORAL | 0 refills | Status: DC

## 2021-12-28 NOTE — Telephone Encounter (Signed)
I sent in a prednisone pack to [facility]

## 2022-01-17 ENCOUNTER — Ambulatory Visit (INDEPENDENT_AMBULATORY_CARE_PROVIDER_SITE_OTHER): Payer: Medicare HMO

## 2022-01-17 ENCOUNTER — Ambulatory Visit (INDEPENDENT_AMBULATORY_CARE_PROVIDER_SITE_OTHER): Payer: Medicare HMO | Admitting: Family Medicine

## 2022-01-17 ENCOUNTER — Encounter: Payer: Self-pay | Admitting: Family Medicine

## 2022-01-17 VITALS — BP 134/78 | HR 102 | Temp 98.2°F | Wt 242.0 lb

## 2022-01-17 DIAGNOSIS — R052 Subacute cough: Secondary | ICD-10-CM

## 2022-01-17 DIAGNOSIS — R053 Chronic cough: Secondary | ICD-10-CM

## 2022-01-17 DIAGNOSIS — U099 Post covid-19 condition, unspecified: Secondary | ICD-10-CM | POA: Diagnosis not present

## 2022-01-17 MED ORDER — FLUTICASONE-SALMETEROL 115-21 MCG/ACT IN AERO: 2.0000 | INHALATION_SPRAY | Freq: Two times a day (BID) | RESPIRATORY_TRACT | 2 refills | Status: DC

## 2022-01-17 NOTE — Progress Notes (Signed)
   Subjective:    Patient ID: [name], male    DOB: 04-07-1960, 61 y.o.   MRN: [id number]  HPI Here to follow up on recent infections with Covid and strep. We saw him virtually on 12-14-21 and he was given a 10 day course of Cefuroxime. His strep symptoms resolved promptly, but he has a a dry cough and fatigue ever since. Presumably these are holdovers from the Covid infection. His cough is dry. No fever or chest pain.    Review of Systems  Constitutional:  Positive for fatigue.  HENT: Negative.    Eyes: Negative.   Respiratory:  Positive for cough and shortness of breath. Negative for wheezing.   Cardiovascular: Negative.        Objective:   Physical Exam Constitutional:      Appearance: Normal appearance. He is not ill-appearing.  Cardiovascular:     Rate and Rhythm: Normal rate and regular rhythm.     Pulses: Normal pulses.     Heart sounds: Normal heart sounds.  Pulmonary:     Effort: Pulmonary effort is normal.     Breath sounds: Normal breath sounds.  Neurological:     Mental Status: He is alert.           Assessment & Plan:  He has some fatigue and a chronic cough from his recent Covid infection. We will get a CXR today. He will try Advair HFA 2 puffs BID. Follow up as needed. Hopefullt these symptoms will resolve in the near future.  [name], MD

## 2022-01-19 ENCOUNTER — Telehealth: Payer: Self-pay

## 2022-01-19 ENCOUNTER — Other Ambulatory Visit: Payer: Self-pay

## 2022-01-19 MED ORDER — AZITHROMYCIN 250 MG PO TABS: ORAL_TABLET | ORAL | 0 refills | Status: DC

## 2022-01-19 NOTE — Telephone Encounter (Signed)
Call in a Zpack  ?

## 2022-01-19 NOTE — Telephone Encounter (Signed)
Rx sent and pt notified.

## 2022-01-19 NOTE — Telephone Encounter (Signed)
Spoke with patient, he stated that last night he was awake coughing the majority of the night, currently experiencing head congestion and a sore throat.     Patient stated that the pharmacy did not fill the prescription for the Advair inhaler, and at this time he does not want to refill the prescription since his Chest XR was clear.    Patient is requesting an antibiotic to help with the symptoms.

## 2022-01-23 NOTE — Telephone Encounter (Signed)
Access Nurse Call 01/23/22 at 1108 ---caller states he has had a cough for 6 weeks. had covid and strep. took antibiotic for strep. still has the cough. was seen last wed for chest xray and it was fine. was given z-pack for cough and it has not helped.  01/23/2022 11:13:34 AM See PCP within 2 Weeks [place], RN, Amanda  Referrals REFERRED TO PCP OFFICE  01/23/22 1426 - Pt states persistent cough, sore throat. Zpack completed from prescribed & cough lingering. Pt would like VV with any available provider. Appt scheduled with Dr Elease Hashimoto for 01/24/22 at 1045

## 2022-01-24 ENCOUNTER — Telehealth (INDEPENDENT_AMBULATORY_CARE_PROVIDER_SITE_OTHER): Payer: Medicare HMO | Admitting: Family Medicine

## 2022-01-24 ENCOUNTER — Encounter: Payer: Self-pay | Admitting: Family Medicine

## 2022-01-24 VITALS — Ht 70.0 in | Wt 242.0 lb

## 2022-01-24 DIAGNOSIS — R053 Chronic cough: Secondary | ICD-10-CM | POA: Diagnosis not present

## 2022-01-24 MED ORDER — AMOXICILLIN-POT CLAVULANATE 875-125 MG PO TABS: 1.0000 | ORAL_TABLET | Freq: Two times a day (BID) | ORAL | 0 refills | Status: DC

## 2022-01-24 NOTE — Progress Notes (Signed)
Patient ID: [name], male   DOB: [birth year], 61 y.o.   MRN: [id number]   Virtual Visit via Video Note  I connected with [name] on 01/24/22 at 10:45 AM EST by a video enabled telemedicine application and verified that I am speaking with the correct person using two identifiers.  Location patient: home Location provider:work or home office Persons participating in the virtual visit: patient, provider  I discussed the limitations of evaluation and management by telemedicine and the availability of in person appointments. The patient expressed understanding and agreed to proceed.   HPI: [name] has been battling some cough now for about 7 weeks.  Started off with strep back in October which was treated with antibiotics and also had positive COVID test at that time.  He subsequently was treated with course of prednisone and then last week had follow-up for persistent cough and had chest x-ray which was read as normal and was treated with Zithromax.  He does have some chronic sinus congestion.  Cough productive of colored sputum.  No documented fever.  Does feel short of breath at times.  He has longstanding history of GERD takes Prilosec regularly for that.  He is on lisinopril 20 mg daily for hypertension.   ROS: See pertinent positives and negatives per HPI.  Past Medical History:  Diagnosis Date   Arthritis    Cancer [facility])    prostate, sees Dr. Risa Grill    Colon polyps    Degenerative disc disease, cervical    Degenerative disc disease, lumbar    Depression    GERD (gastroesophageal reflux disease)    Low back pain    Osteopenia     Past Surgical History:  Procedure Laterality Date   CERVICAL FUSION  2015   C3 through C7, per Dr. Harl Bowie    COLONOSCOPY  03/26/2018    per Dr. Alessandra Bevels, adenomatous polyps, repeat in 3 yrs    CYSTECTOMY     benign, right hip   GYNECOMASTIA EXCISION     bilateral, reduction   HIP SURGERY Right    12/2020   JOINT REPLACEMENT  12/20/2011    hx. LTHA, now RTHA planned   LAMINECTOMY  04/2007   lumbar   LUMBAR FUSION  2015   L4 through S1, per Dr. Harl Bowie    LUMBAR MICRODISCECTOMY  10/15/2008   L-4-5, per Dr. Jenne Campus at [place]  12/13/2010   robotic prostatectomy per Dr. Risa Grill    TOTAL HIP ARTHROPLASTY  12/20/2011   2010 left hip   TOTAL HIP ARTHROPLASTY  12/26/2011   Procedure: TOTAL HIP ARTHROPLASTY;  Surgeon: Gearlean Alf, MD;  Location: [facility];  Service: Orthopedics;  Laterality: Right;    Family History  Problem Relation Age of Onset   Hypertension Father    Suicidality Father    Healthy Mother    Arthritis Other    Cancer Brother        Bone cancer    SOCIAL HX: Non-smoker   Current Outpatient Medications:    amoxicillin-clavulanate (AUGMENTIN) 875-125 MG tablet, Take 1 tablet by mouth 2 (two) times daily., Disp: 20 tablet, Rfl: 0   anastrozole (ARIMIDEX) 1 MG tablet, TAKE 1 TABLET BY MOUTH EVERY DAY, Disp: 90 tablet, Rfl: 0   Cholecalciferol (VITAMIN D) 125 MCG (5000 UT) CAPS, Take 5,000 Units by mouth daily., Disp: , Rfl:    diclofenac (VOLTAREN) 75 MG EC tablet, Take 1 tablet (75 mg total) by mouth 2 (two)  times daily., Disp: 120 tablet, Rfl: 1   HYDROcodone bit-homatropine (HYCODAN) 5-1.5 MG/5ML syrup, Take 5 mLs by mouth every 4 (four) hours as needed for cough., Disp: 240 mL, Rfl: 0   lisinopril (ZESTRIL) 20 MG tablet, Take 1 tablet (20 mg total) by mouth daily., Disp: 90 tablet, Rfl: 1   omeprazole (PRILOSEC) 40 MG capsule, TAKE 1 CAPSULE EVERY DAY, Disp: 90 capsule, Rfl: 3   testosterone cypionate (DEPOTESTOSTERONE CYPIONATE) 200 MG/ML injection, INJECT 1 ML INTO THE MUSCLE EVERY 7 DAYS., Disp: 10 mL, Rfl: 3   traMADol (ULTRAM) 50 MG tablet, TAKE 2 TABLETS EVERY 6 HOURS AS NEEDED FOR PAIN, Disp: 240 tablet, Rfl: 1  EXAM:  VITALS per patient if applicable:  GENERAL: alert, oriented, appears well and in no acute distress  HEENT: atraumatic, conjunttiva  clear, no obvious abnormalities on inspection of external nose and ears  NECK: normal movements of the head and neck  LUNGS: on inspection no signs of respiratory distress, breathing rate appears normal, no obvious gross SOB, gasping or wheezing  CV: no obvious cyanosis  MS: moves all visible extremities without noticeable abnormality  PSYCH/NEURO: pleasant and cooperative, no obvious depression or anxiety, speech and thought processing grossly intact  ASSESSMENT AND PLAN:  Discussed the following assessment and plan:  Persistent cough now for over 7 weeks.  Recent chest x-ray unremarkable.  He is on ACE inhibitor with lisinopril but has been on this for quite some time.  He is describing some possible chronic sinusitis symptoms  -Discussed covering with Augmentin 875 mg twice daily for 10 days -Consider short-term use of probiotic -Consider humidifier in bedroom at night -If cough and other symptoms not improving with the above consider discussing with primary possible change from lisinopril to angiotensin receptor blocker to see if cough improves with that.     I discussed the assessment and treatment plan with the patient. The patient was provided an opportunity to ask questions and all were answered. The patient agreed with the plan and demonstrated an understanding of the instructions.   The patient was advised to call back or seek an in-person evaluation if the symptoms worsen or if the condition fails to improve as anticipated.     Carolann Littler, MD

## 2022-02-06 DIAGNOSIS — M7061 Trochanteric bursitis, right hip: Secondary | ICD-10-CM | POA: Diagnosis not present

## 2022-02-12 ENCOUNTER — Ambulatory Visit: Payer: Medicare HMO

## 2022-02-14 DIAGNOSIS — M25551 Pain in right hip: Secondary | ICD-10-CM | POA: Diagnosis not present

## 2022-02-16 ENCOUNTER — Ambulatory Visit: Payer: Medicare HMO

## 2022-02-19 ENCOUNTER — Other Ambulatory Visit: Payer: Self-pay

## 2022-02-19 ENCOUNTER — Ambulatory Visit (INDEPENDENT_AMBULATORY_CARE_PROVIDER_SITE_OTHER): Payer: Medicare HMO | Admitting: Family Medicine

## 2022-02-19 ENCOUNTER — Telehealth: Payer: Self-pay | Admitting: Family Medicine

## 2022-02-19 ENCOUNTER — Encounter: Payer: Self-pay | Admitting: Family Medicine

## 2022-02-19 VITALS — BP 128/78 | HR 93 | Temp 98.6°F | Wt 238.0 lb

## 2022-02-19 DIAGNOSIS — N401 Enlarged prostate with lower urinary tract symptoms: Secondary | ICD-10-CM

## 2022-02-19 DIAGNOSIS — E291 Testicular hypofunction: Secondary | ICD-10-CM | POA: Diagnosis not present

## 2022-02-19 DIAGNOSIS — T464X5A Adverse effect of angiotensin-converting-enzyme inhibitors, initial encounter: Secondary | ICD-10-CM

## 2022-02-19 DIAGNOSIS — R058 Other specified cough: Secondary | ICD-10-CM

## 2022-02-19 DIAGNOSIS — Z23 Encounter for immunization: Secondary | ICD-10-CM | POA: Diagnosis not present

## 2022-02-19 DIAGNOSIS — I10 Essential (primary) hypertension: Secondary | ICD-10-CM | POA: Diagnosis not present

## 2022-02-19 DIAGNOSIS — E785 Hyperlipidemia, unspecified: Secondary | ICD-10-CM

## 2022-02-19 DIAGNOSIS — N138 Other obstructive and reflux uropathy: Secondary | ICD-10-CM

## 2022-02-19 DIAGNOSIS — Z0000 Encounter for general adult medical examination without abnormal findings: Secondary | ICD-10-CM | POA: Diagnosis not present

## 2022-02-19 LAB — BASIC METABOLIC PANEL
BUN: 14 mg/dL (ref 6–23)
CO2: 30 mEq/L (ref 19–32)
Calcium: 9.5 mg/dL (ref 8.4–10.5)
Chloride: 100 mEq/L (ref 96–112)
Creatinine, Ser: 0.95 mg/dL (ref 0.40–1.50)
GFR: 87.39 mL/min (ref >60.00)
Glucose, Bld: 82 mg/dL (ref 70–99)
Potassium: 4.5 mEq/L (ref 3.5–5.1)
Sodium: 138 mEq/L (ref 135–145)

## 2022-02-19 LAB — HEPATIC FUNCTION PANEL
ALT: 40 U/L (ref 0–53)
AST: 33 U/L (ref 0–37)
Albumin: 4.6 g/dL (ref 3.5–5.2)
Alkaline Phosphatase: 88 U/L (ref 39–117)
Bilirubin, Direct: 0.1 mg/dL (ref 0.0–0.3)
Total Bilirubin: 0.7 mg/dL (ref 0.2–1.2)
Total Protein: 7.3 g/dL (ref 6.0–8.3)

## 2022-02-19 LAB — CBC WITH DIFFERENTIAL/PLATELET
Basophils Absolute: 0.1 10*3/uL (ref 0.0–0.1)
Basophils Relative: 1.2 % (ref 0.0–3.0)
Eosinophils Absolute: 0.2 10*3/uL (ref 0.0–0.7)
Eosinophils Relative: 3 % (ref 0.0–5.0)
HCT: 42.3 % (ref 39.0–52.0)
Hemoglobin: 13.5 g/dL (ref 13.0–17.0)
Lymphocytes Relative: 17.3 % (ref 12.0–46.0)
Lymphs Abs: 1.1 10*3/uL (ref 0.7–4.0)
MCHC: 31.8 g/dL (ref 30.0–36.0)
MCV: 67.2 fl — ABNORMAL LOW (ref 78.0–100.0)
Monocytes Absolute: 0.9 10*3/uL (ref 0.1–1.0)
Monocytes Relative: 14.3 % — ABNORMAL HIGH (ref 3.0–12.0)
Neutro Abs: 4 10*3/uL (ref 1.4–7.7)
Neutrophils Relative %: 64.2 % (ref 43.0–77.0)
Platelets: 310 10*3/uL (ref 150.0–400.0)
RBC: 6.3 Mil/uL — ABNORMAL HIGH (ref 4.22–5.81)
RDW: 20.3 % — ABNORMAL HIGH (ref 11.5–15.5)
WBC: 6.2 10*3/uL (ref 4.0–10.5)

## 2022-02-19 LAB — PSA: PSA: 0 ng/mL — ABNORMAL LOW (ref 0.10–4.00)

## 2022-02-19 LAB — VITAMIN B12: Vitamin B-12: 957 pg/mL — ABNORMAL HIGH (ref 211–911)

## 2022-02-19 LAB — IBC + FERRITIN
Ferritin: 8.6 ng/mL — ABNORMAL LOW (ref 22.0–322.0)
Iron: 29 ug/dL — ABNORMAL LOW (ref 42–165)
Saturation Ratios: 5.6 % — ABNORMAL LOW (ref 20.0–50.0)
TIBC: 522.2 ug/dL — ABNORMAL HIGH (ref 250.0–450.0)
Transferrin: 373 mg/dL — ABNORMAL HIGH (ref 212.0–360.0)

## 2022-02-19 LAB — HEMOGLOBIN A1C: Hgb A1c MFr Bld: 5.6 % (ref 4.6–6.5)

## 2022-02-19 LAB — LIPID PANEL
Cholesterol: 199 mg/dL (ref 0–200)
HDL: 35.6 mg/dL — ABNORMAL LOW (ref >39.00)
LDL Cholesterol: 145 mg/dL — ABNORMAL HIGH (ref 0–99)
NonHDL: 163.58
Total CHOL/HDL Ratio: 6
Triglycerides: 95 mg/dL (ref 0.0–149.0)
VLDL: 19 mg/dL (ref 0.0–40.0)

## 2022-02-19 LAB — TESTOSTERONE: Testosterone: 1228.11 ng/dL — ABNORMAL HIGH (ref 300.00–890.00)

## 2022-02-19 LAB — TSH: TSH: 2.53 u[IU]/mL (ref 0.35–5.50)

## 2022-02-19 MED ORDER — LOSARTAN POTASSIUM 50 MG PO TABS: 50.0000 mg | ORAL_TABLET | Freq: Every day | ORAL | 3 refills | Status: DC

## 2022-02-19 MED ORDER — TELMISARTAN 20 MG PO TABS: 20.0000 mg | ORAL_TABLET | Freq: Every day | ORAL | 5 refills | Status: DC

## 2022-02-19 NOTE — Telephone Encounter (Signed)
Message sent to patient via my chart message

## 2022-02-19 NOTE — Telephone Encounter (Signed)
Pt was just seen this morning, and wanted to make sure MD knew he wanted his prescriptions sent to:  [id number] [place] Phone: [phone] Fax: [phone]

## 2022-02-19 NOTE — Telephone Encounter (Signed)
Spoke with pt state that he wants to change Losartan to Telmisartan if Dr Sarajane Jews agrees, states that the losartan can increase potassium levels and pt likes to eat a lot of bananas and avocados. Please advise

## 2022-02-19 NOTE — Telephone Encounter (Signed)
Yes I saw his message and changed it to [facility]

## 2022-02-19 NOTE — Telephone Encounter (Signed)
That's fine. Cancel the Losartan and call in Temisartan 20 mg daily, #30 with 5 rf. He should report back in 3-4 weeks with how the BP is doing

## 2022-02-19 NOTE — Addendum Note (Signed)
Addended by: Wyvonne Lenz on: 02/19/2022 10:20 AM   Modules accepted: Orders

## 2022-02-19 NOTE — Progress Notes (Signed)
   Subjective:    Patient ID: ABDULHAMID [name], male    DOB: 11-01-1960, 61 y.o.   MRN: [id number]  HPI Here for several issues. First he has had a dry tickling cough for about 10 weeks. He has taken several rounds of antibiotics, and now he does not feel sick. However the cough lingers. He has heard that this can be a side effect of Lisinopril. His BP at home is stable around 120's over 80's. Also he wants to check labs today since his is about to go on a diet with the goal of losing 25 lbs. He also wants to check hormone levels.    Review of Systems  Constitutional: Negative.   HENT: Negative.    Eyes: Negative.   Respiratory:  Positive for cough. Negative for shortness of breath and wheezing.   Cardiovascular: Negative.        Objective:   Physical Exam Constitutional:      Appearance: Normal appearance.  Cardiovascular:     Rate and Rhythm: Normal rate and regular rhythm.     Pulses: Normal pulses.     Heart sounds: Normal heart sounds.  Pulmonary:     Effort: Pulmonary effort is normal.     Breath sounds: Normal breath sounds.  Neurological:     Mental Status: He is alert.           Assessment & Plan:  His HTN is well controlled, but I agree the cough is likely to be a side effect of the Lisinopril. We will stop this and start him on Losartan 50 mg daily. He will report back in 3-4 weeks. We will get fasting labs as above.  William Penna, MD

## 2022-02-20 LAB — ESTRADIOL: Estradiol: 51 pg/mL — ABNORMAL HIGH (ref <=39)

## 2022-02-21 DIAGNOSIS — M25551 Pain in right hip: Secondary | ICD-10-CM | POA: Diagnosis not present

## 2022-02-27 ENCOUNTER — Encounter: Payer: Self-pay | Admitting: Family Medicine

## 2022-02-28 ENCOUNTER — Other Ambulatory Visit: Payer: Self-pay | Admitting: Family Medicine

## 2022-03-01 ENCOUNTER — Telehealth: Payer: Self-pay | Admitting: Family Medicine

## 2022-03-01 NOTE — Telephone Encounter (Signed)
Pt is calling and would like a refill on diclofenac (VOLTAREN) 75 MG EC tablet  and traMADol (ULTRAM) 50 MG tablet  [facility] - [place], [place] Phone: [phone]  Fax: [phone]

## 2022-03-01 NOTE — Telephone Encounter (Signed)
Pt LOV was on 02/19/22 Last refill done on 11/14/21 Please advise

## 2022-03-02 MED ORDER — DICLOFENAC SODIUM 75 MG PO TBEC: 75.0000 mg | DELAYED_RELEASE_TABLET | Freq: Two times a day (BID) | ORAL | 3 refills | Status: DC

## 2022-03-02 MED ORDER — TRAMADOL HCL 50 MG PO TABS: ORAL_TABLET | ORAL | 1 refills | Status: DC

## 2022-03-02 NOTE — Telephone Encounter (Signed)
Done

## 2022-03-07 ENCOUNTER — Ambulatory Visit (INDEPENDENT_AMBULATORY_CARE_PROVIDER_SITE_OTHER): Payer: Medicare HMO

## 2022-03-07 VITALS — Ht 70.0 in | Wt 238.0 lb

## 2022-03-07 DIAGNOSIS — Z0000 Encounter for general adult medical examination without abnormal findings: Secondary | ICD-10-CM | POA: Diagnosis not present

## 2022-03-07 NOTE — Progress Notes (Signed)
Subjective:   [name] is a 62 y.o. male who presents for Medicare Annual/Subsequent preventive examination.  Review of Systems    Virtual Visit via Telephone Note  I connected with  [name] on 03/07/22 at  8:45 AM EST by telephone and verified that I am speaking with the correct person using two identifiers.  Location: Patient: Home Provider: Office Persons participating in the virtual visit: patient/Nurse Health Advisor   I discussed the limitations, risks, security and privacy concerns of performing an evaluation and management service by telephone and the availability of in person appointments. The patient expressed understanding and agreed to proceed.  Interactive audio and video telecommunications were attempted between this nurse and patient, however failed, due to patient having technical difficulties OR patient did not have access to video capability.  We continued and completed visit with audio only.  Some vital signs may be absent or patient reported.   [name], LPN  Cardiac Risk Factors include: advanced age (>33mn, >>31women);male gender;hypertension     Objective:    Today's Vitals   03/07/22 0908  Weight: 238 lb (108 kg)  Height: '5\' 10"'$  (1.778 m)   Body mass index is 34.15 kg/m.     03/07/2022    9:18 AM 02/07/2021    3:50 PM 10/14/2019    8:24 PM 12/17/2014   12:08 PM 12/28/2011   11:17 AM 12/26/2011    3:27 PM 12/20/2011    2:00 PM  Advanced Directives  Does Patient Have a Medical Advance Directive? No No No No  Patient does not have advance directive;Patient would not like information Patient does not have advance directive  Would patient like information on creating a medical advance directive? No - Patient declined  No - Patient declined No - patient declined information     Pre-existing out of facility DNR order (yellow form or pink MOST form)     No No No    Current Medications (verified) Outpatient Encounter Medications  as of 03/07/2022  Medication Sig   anastrozole (ARIMIDEX) 1 MG tablet TAKE 1 TABLET BY MOUTH EVERY DAY   Cholecalciferol (VITAMIN D) 125 MCG (5000 UT) CAPS Take 5,000 Units by mouth daily.   diclofenac (VOLTAREN) 75 MG EC tablet Take 1 tablet (75 mg total) by mouth 2 (two) times daily.   omeprazole (PRILOSEC) 40 MG capsule TAKE 1 CAPSULE EVERY DAY   telmisartan (MICARDIS) 20 MG tablet Take 1 tablet (20 mg total) by mouth daily.   testosterone cypionate (DEPOTESTOSTERONE CYPIONATE) 200 MG/ML injection INJECT 1 ML INTO THE MUSCLE EVERY 7 DAYS.   traMADol (ULTRAM) 50 MG tablet TAKE 2 TABLETS EVERY 6 HOURS AS NEEDED FOR PAIN   No facility-administered encounter medications on file as of 03/07/2022.    Allergies (verified) Flexeril [cyclobenzaprine], Oxycodone-acetaminophen, and Oxycodone-acetaminophen   History: Past Medical History:  Diagnosis Date   Arthritis    Cancer ([place]    prostate, sees Dr. GRisa Grill   Colon polyps    Degenerative disc disease, cervical    Degenerative disc disease, lumbar    Depression    GERD (gastroesophageal reflux disease)    Low back pain    Osteopenia    Past Surgical History:  Procedure Laterality Date   CERVICAL FUSION  2015   C3 through C7, per Dr. BHarl Bowie   COLONOSCOPY  03/26/2018    per Dr. BAlessandra Bevels adenomatous polyps, repeat in 3 yrs    CYSTECTOMY     benign,  right hip   GYNECOMASTIA EXCISION     bilateral, reduction   HIP SURGERY Right    12/2020   JOINT REPLACEMENT  12/20/2011   hx. LTHA, now RTHA planned   LAMINECTOMY  04/2007   lumbar   LUMBAR FUSION  2015   L4 through S1, per Dr. Harl Bowie    LUMBAR MICRODISCECTOMY  10/15/2008   L-4-5, per Dr. Jenne Campus at [facility]  12/13/2010   robotic prostatectomy per Dr. Risa Grill    TOTAL HIP ARTHROPLASTY  12/20/2011   2010 left hip   TOTAL HIP ARTHROPLASTY  12/26/2011   Procedure: TOTAL HIP ARTHROPLASTY;  Surgeon: Gearlean Alf, MD;  Location: [facility];   Service: Orthopedics;  Laterality: Right;   Family History  Problem Relation Age of Onset   Hypertension Father    Suicidality Father    Healthy Mother    Arthritis Other    Cancer Brother        Bone cancer   Social History   Socioeconomic History   Marital status: Divorced    Spouse name: Not on file   Number of children: Not on file   Years of education: Not on file   Highest education level: Not on file  Occupational History   Not on file  Tobacco Use   Smoking status: Never   Smokeless tobacco: Never  Vaping Use   Vaping Use: Never used  Substance and Sexual Activity   Alcohol use: No    Alcohol/week: 0.0 standard drinks of alcohol    Comment: rare   Drug use: No   Sexual activity: Yes  Other Topics Concern   Not on file  Social History Narrative   Lives alone in a one story home.  No children.     On disability since September 2016.  He was previously working in Engineer, technical sales.    Education: high school.   Social Determinants of Health   Financial Resource Strain: Low Risk  (03/07/2022)   Overall Financial Resource Strain (CARDIA)    Difficulty of Paying Living Expenses: Not hard at all  Recent Concern: Financial Resource Strain - Medium Risk (01/15/2022)   Overall Financial Resource Strain (CARDIA)    Difficulty of Paying Living Expenses: Somewhat hard  Food Insecurity: No Food Insecurity (03/07/2022)   Hunger Vital Sign    Worried About Running Out of Food in the Last Year: Never true    Ran Out of Food in the Last Year: Never true  Transportation Needs: No Transportation Needs (03/07/2022)   PRAPARE - Hydrologist (Medical): No    Lack of Transportation (Non-Medical): No  Physical Activity: Sufficiently Active (03/07/2022)   Exercise Vital Sign    Days of Exercise per Week: 4 days    Minutes of Exercise per Session: 60 min  Recent Concern: Physical Activity - Insufficiently Active (01/15/2022)   Exercise Vital Sign    Days of Exercise per  Week: 3 days    Minutes of Exercise per Session: 30 min  Stress: No Stress Concern Present (03/07/2022)   [place]    Feeling of Stress : Not at all  Social Connections: Socially Isolated (03/07/2022)   Social Connection and Isolation Panel [NHANES]    Frequency of Communication with Friends and Family: More than three times a week    Frequency of Social Gatherings with Friends and Family: More than three times a week  Attends Religious Services: Never    Active Member of Clubs or Organizations: No    Attends Archivist Meetings: Never    Marital Status: Divorced    Tobacco Counseling Counseling given: Not Answered   Clinical Intake:  Pre-visit preparation completed: Yes  Pain : No/denies pain     BMI - recorded: 34.15 Nutritional Status: BMI > 30  Obese Nutritional Risks: None Diabetes: No  How often do you need to have someone help you when you read instructions, pamphlets, or other written materials from your doctor or pharmacy?: 1 - Never  Diabetic?  No  Interpreter Needed?: No  Information entered by :: Rolene Arbour LPN   Activities of Daily Living    03/07/2022    9:14 AM 03/06/2022    7:05 PM  In your present state of health, do you have any difficulty performing the following activities:  Hearing? 0 0  Vision? 0 1  Difficulty concentrating or making decisions? 0 0  Walking or climbing stairs? 1 1  Comment Uses a cane. Followed by Orthopedic   Dressing or bathing? 0 0  Doing errands, shopping? 0 0  Preparing Food and eating ? N N  Using the Toilet? N N  In the past six months, have you accidently leaked urine? N Y  Do you have problems with loss of bowel control? N N  Managing your Medications? N N  Managing your Finances? N N  Housekeeping or managing your Housekeeping? Aggie Moats    Patient Care Team: Laurey Morale, MD as PCP - General Nahser, Wonda Cheng, MD as PCP - Cardiology  (Cardiology)  Indicate any recent Medical Services you may have received from other than [facility] providers in the past year (date may be approximate).     Assessment:   This is a routine wellness examination for Mae.  Hearing/Vision screen Hearing Screening - Comments:: Denies hearing difficulties   Vision Screening - Comments:: Wears rx glasses - up to date with routine eye exams with    Dietary issues and exercise activities discussed: Current Exercise Habits: Home exercise routine, Type of exercise: walking, Time (Minutes): 60, Frequency (Times/Week): 4, Weekly Exercise (Minutes/Week): 240, Intensity: Moderate, Exercise limited by: None identified   Goals Addressed               This Visit's Progress     No Current goals (pt-stated)         Depression Screen    03/07/2022    9:14 AM 01/17/2022    8:50 AM 11/17/2021    8:48 AM 10/03/2021    8:29 AM 06/19/2021    9:27 AM 05/12/2021    9:25 AM 02/28/2021    2:56 PM  PHQ 2/9 Scores  PHQ - 2 Score 0 0 0 0 1 0 2  PHQ- 9 Score 0 3 4 0 '3 3 6    '$ Fall Risk    03/07/2022    9:18 AM 03/06/2022    7:05 PM 01/17/2022    8:53 AM 11/17/2021    8:48 AM 10/03/2021    8:29 AM  Fall Risk   Falls in the past year? 0 0 0 0 0  Number falls in past yr: 0  0 0 0  Injury with Fall? 0  0 0 0  Risk for fall due to : No Fall Risks  No Fall Risks No Fall Risks No Fall Risks  Follow up Falls prevention discussed  Falls evaluation completed Falls evaluation completed Falls  evaluation completed    FALL RISK PREVENTION PERTAINING TO THE HOME:  Any stairs in or around the home? Yes  If so, are there any without handrails? No  Home free of loose throw rugs in walkways, pet beds, electrical cords, etc? Yes  Adequate lighting in your home to reduce risk of falls? Yes    ASSISTIVE DEVICES UTILIZED TO PREVENT FALLS:  Life alert? No  Use of a cane, walker or w/c? Yes  Grab bars in the bathroom? Yes  Shower chair or bench in shower? Yes  Elevated  toilet seat or a handicapped toilet? Yes   TIMED UP AND GO:  Was the test performed? No . Audio Visit    Cognitive Function:        03/07/2022    9:19 AM 02/07/2021    3:54 PM  6CIT Screen  What Year? 0 points 0 points  What month? 0 points 0 points  What time? 0 points 0 points  Count back from 20 0 points 0 points  Months in reverse 0 points 0 points  Repeat phrase 0 points 0 points  Total Score 0 points 0 points    Immunizations Immunization History  Administered Date(s) Administered   Influenza,inj,Quad PF,6+ Mos 11/28/2016, 01/22/2018, 12/22/2018, 01/20/2021, 02/19/2022   Influenza-Unspecified 12/22/2018, 01/23/2019   PFIZER(Purple Top)SARS-COV-2 Vaccination 10/09/2019, 11/03/2019   Tdap 11/28/2016    TDAP status: Up to date  Flu Vaccine status: Up to date    Covid-19 vaccine status: Completed vaccines  Qualifies for Shingles Vaccine? Yes   Zostavax completed No   Shingrix Completed?: No.    Education has been provided regarding the importance of this vaccine. Patient has been advised to call insurance company to determine out of pocket expense if they have not yet received this vaccine. Advised may also receive vaccine at local pharmacy or Health Dept. Verbalized acceptance and understanding.  Screening Tests Health Maintenance  Topic Date Due   COVID-19 Vaccine (3 - Pfizer risk series) 03/23/2022 (Originally 12/01/2019)   Zoster Vaccines- Shingrix (1 of 2) 06/06/2022 (Originally 04/10/1979)   Medicare Annual Wellness (AWV)  03/08/2023   DTaP/Tdap/Td (2 - Td or Tdap) 11/29/2026   COLONOSCOPY (Pts 45-34yr Insurance coverage will need to be confirmed)  05/05/2028   INFLUENZA VACCINE  Completed   Hepatitis C Screening  Completed   HIV Screening  Completed   HPV VACCINES  Aged Out    Health Maintenance  There are no preventive care reminders to display for this patient.   Colorectal cancer screening: Type of screening: Colonoscopy. Completed 05/06/18.  Repeat every 10 years  Lung Cancer Screening: (Low Dose CT Chest recommended if Age 60-80years, 30 pack-year currently smoking OR have quit w/in 15years.) does not qualify.     Additional Screening:  Hepatitis C Screening: does qualify; Completed 07/31/17  Vision Screening: Recommended annual ophthalmology exams for early detection of glaucoma and other disorders of the eye. Is the patient up to date with their annual eye exam?  Yes  Who is the provider or what is the name of the office in which the patient attends annual eye exams? Dr HDesma McgregorIf pt is not established with a provider, would they like to be referred to a provider to establish care? No .   Dental Screening: Recommended annual dental exams for proper oral hygiene  Community Resource Referral / Chronic Care Management:  CRR required this visit?  No   CCM required this visit?  No  Plan:     I have personally reviewed and noted the following in the patient's chart:   Medical and social history Use of alcohol, tobacco or illicit drugs  Current medications and supplements including opioid prescriptions. Patient is currently taking opioid prescriptions. Information provided to patient regarding non-opioid alternatives. Patient advised to discuss non-opioid treatment plan with their provider. Functional ability and status Nutritional status Physical activity Advanced directives List of other physicians Hospitalizations, surgeries, and ER visits in previous 12 months Vitals Screenings to include cognitive, depression, and falls Referrals and appointments  In addition, I have reviewed and discussed with patient certain preventive protocols, quality metrics, and best practice recommendations. A written personalized care plan for preventive services as well as general preventive health recommendations were provided to patient.     [name], LPN   06/03/7528   Nurse Notes: None

## 2022-03-07 NOTE — Patient Instructions (Addendum)
[name] , Thank you for taking time to come for your Medicare Wellness Visit. I appreciate your ongoing commitment to your health goals. Please review the following plan we discussed and let me know if I can assist you in the future.   These are the goals we discussed:  Goals       No Current goals (pt-stated)      Patient Stated      02/07/2021, continue going to the gym and remain as healthy as possible        This is a list of the screening recommended for you and due dates:  Health Maintenance  Topic Date Due   COVID-19 Vaccine (3 - Pfizer risk series) 03/23/2022*   Zoster (Shingles) Vaccine (1 of 2) 06/06/2022*   Medicare Annual Wellness Visit  03/08/2023   DTaP/Tdap/Td vaccine (2 - Td or Tdap) 11/29/2026   Colon Cancer Screening  05/05/2028   Flu Shot  Completed   Hepatitis C Screening: USPSTF Recommendation to screen - Ages 18-79 yo.  Completed   HIV Screening  Completed   HPV Vaccine  Aged Out  *Topic was postponed. The date shown is not the original due date.    Advanced directives: Advance directive discussed with you today. Even though you declined this today, please call our office should you change your mind, and we can give you the proper paperwork for you to fill out.   Conditions/risks identified: None  Next appointment: Follow up in one year for your annual wellness visit    Preventive Care 40-64 Years, Male Preventive care refers to lifestyle choices and visits with your health care provider that can promote health and wellness. What does preventive care include? A yearly physical exam. This is also called an annual well check. Dental exams once or twice a year. Routine eye exams. Ask your health care provider how often you should have your eyes checked. Personal lifestyle choices, including: Daily care of your teeth and gums. Regular physical activity. Eating a healthy diet. Avoiding tobacco and drug use. Limiting alcohol use. Practicing safe  sex. Taking low-dose aspirin every day starting at age 44. What happens during an annual well check? The services and screenings done by your health care provider during your annual well check will depend on your age, overall health, lifestyle risk factors, and family history of disease. Counseling  Your health care provider may ask you questions about your: Alcohol use. Tobacco use. Drug use. Emotional well-being. Home and relationship well-being. Sexual activity. Eating habits. Work and work Statistician. Screening  You may have the following tests or measurements: Height, weight, and BMI. Blood pressure. Lipid and cholesterol levels. These may be checked every 5 years, or more frequently if you are over 20 years old. Skin check. Lung cancer screening. You may have this screening every year starting at age 63 if you have a 30-pack-year history of smoking and currently smoke or have quit within the past 15 years. Fecal occult blood test (FOBT) of the stool. You may have this test every year starting at age 33. Flexible sigmoidoscopy or colonoscopy. You may have a sigmoidoscopy every 5 years or a colonoscopy every 10 years starting at age 29. Prostate cancer screening. Recommendations will vary depending on your family history and other risks. Hepatitis C blood test. Hepatitis B blood test. Sexually transmitted disease (STD) testing. Diabetes screening. This is done by checking your blood sugar (glucose) after you have not eaten for a while (fasting). You may have  this done every 1-3 years. Discuss your test results, treatment options, and if necessary, the need for more tests with your health care provider. Vaccines  Your health care provider may recommend certain vaccines, such as: Influenza vaccine. This is recommended every year. Tetanus, diphtheria, and acellular pertussis (Tdap, Td) vaccine. You may need a Td booster every 10 years. Zoster vaccine. You may need this after age  28. Pneumococcal 13-valent conjugate (PCV13) vaccine. You may need this if you have certain conditions and have not been vaccinated. Pneumococcal polysaccharide (PPSV23) vaccine. You may need one or two doses if you smoke cigarettes or if you have certain conditions. Talk to your health care provider about which screenings and vaccines you need and how often you need them. This information is not intended to replace advice given to you by your health care provider. Make sure you discuss any questions you have with your health care provider. Document Released: 03/18/2015 Document Revised: 11/09/2015 Document Reviewed: 12/21/2014 Elsevier Interactive Patient Education  2017 [employer] Prevention in the Home Falls can cause injuries. They can happen to people of all ages. There are many things you can do to make your home safe and to help prevent falls. What can I do on the outside of my home? Regularly fix the edges of walkways and driveways and fix any cracks. Remove anything that might make you trip as you walk through a door, such as a raised step or threshold. Trim any bushes or trees on the path to your home. Use bright outdoor lighting. Clear any walking paths of anything that might make someone trip, such as rocks or tools. Regularly check to see if handrails are loose or broken. Make sure that both sides of any steps have handrails. Any raised decks and porches should have guardrails on the edges. Have any leaves, snow, or ice cleared regularly. Use sand or salt on walking paths during winter. Clean up any spills in your garage right away. This includes oil or grease spills. What can I do in the bathroom? Use night lights. Install grab bars by the toilet and in the tub and shower. Do not use towel bars as grab bars. Use non-skid mats or decals in the tub or shower. If you need to sit down in the shower, use a plastic, non-slip stool. Keep the floor dry. Clean up any water  that spills on the floor as soon as it happens. Remove soap buildup in the tub or shower regularly. Attach bath mats securely with double-sided non-slip rug tape. Do not have throw rugs and other things on the floor that can make you trip. What can I do in the bedroom? Use night lights. Make sure that you have a light by your bed that is easy to reach. Do not use any sheets or blankets that are too big for your bed. They should not hang down onto the floor. Have a firm chair that has side arms. You can use this for support while you get dressed. Do not have throw rugs and other things on the floor that can make you trip. What can I do in the kitchen? Clean up any spills right away. Avoid walking on wet floors. Keep items that you use a lot in easy-to-reach places. If you need to reach something above you, use a strong step stool that has a grab bar. Keep electrical cords out of the way. Do not use floor polish or wax that makes floors slippery. If you  must use wax, use non-skid floor wax. Do not have throw rugs and other things on the floor that can make you trip. What can I do with my stairs? Do not leave any items on the stairs. Make sure that there are handrails on both sides of the stairs and use them. Fix handrails that are broken or loose. Make sure that handrails are as long as the stairways. Check any carpeting to make sure that it is firmly attached to the stairs. Fix any carpet that is loose or worn. Avoid having throw rugs at the top or bottom of the stairs. If you do have throw rugs, attach them to the floor with carpet tape. Make sure that you have a light switch at the top of the stairs and the bottom of the stairs. If you do not have them, ask someone to add them for you. What else can I do to help prevent falls? Wear shoes that: Do not have high heels. Have rubber bottoms. Are comfortable and fit you well. Are closed at the toe. Do not wear sandals. If you use a  stepladder: Make sure that it is fully opened. Do not climb a closed stepladder. Make sure that both sides of the stepladder are locked into place. Ask someone to hold it for you, if possible. Clearly mark and make sure that you can see: Any grab bars or handrails. First and last steps. Where the edge of each step is. Use tools that help you move around (mobility aids) if they are needed. These include: Canes. Walkers. Scooters. Crutches. Turn on the lights when you go into a dark area. Replace any light bulbs as soon as they burn out. Set up your furniture so you have a clear path. Avoid moving your furniture around. If any of your floors are uneven, fix them. If there are any pets around you, be aware of where they are. Review your medicines with your doctor. Some medicines can make you feel dizzy. This can increase your chance of falling. Ask your doctor what other things that you can do to help prevent falls. This information is not intended to replace advice given to you by your health care provider. Make sure you discuss any questions you have with your health care provider. Document Released: 12/16/2008 Document Revised: 07/28/2015 Document Reviewed: 03/26/2014 Elsevier Interactive Patient Education  2017 [employer].

## 2022-03-23 ENCOUNTER — Other Ambulatory Visit: Payer: Self-pay | Admitting: Family Medicine

## 2022-03-23 DIAGNOSIS — M25551 Pain in right hip: Secondary | ICD-10-CM | POA: Diagnosis not present

## 2022-03-23 DIAGNOSIS — M7061 Trochanteric bursitis, right hip: Secondary | ICD-10-CM | POA: Diagnosis not present

## 2022-03-23 DIAGNOSIS — Z96641 Presence of right artificial hip joint: Secondary | ICD-10-CM | POA: Diagnosis not present

## 2022-04-06 DIAGNOSIS — M25551 Pain in right hip: Secondary | ICD-10-CM | POA: Diagnosis not present

## 2022-04-27 DIAGNOSIS — Z96641 Presence of right artificial hip joint: Secondary | ICD-10-CM | POA: Diagnosis not present

## 2022-04-27 DIAGNOSIS — M25551 Pain in right hip: Secondary | ICD-10-CM | POA: Diagnosis not present

## 2022-05-14 ENCOUNTER — Other Ambulatory Visit: Payer: Self-pay

## 2022-05-14 ENCOUNTER — Other Ambulatory Visit: Payer: Self-pay | Admitting: Family Medicine

## 2022-05-14 MED ORDER — ANASTROZOLE 1 MG PO TABS: 1.0000 mg | ORAL_TABLET | Freq: Every day | ORAL | 0 refills | Status: DC

## 2022-05-28 ENCOUNTER — Other Ambulatory Visit: Payer: Self-pay | Admitting: Family Medicine

## 2022-07-04 ENCOUNTER — Other Ambulatory Visit: Payer: Self-pay | Admitting: Family Medicine

## 2022-07-05 NOTE — Telephone Encounter (Signed)
Pt LOV  and last Testosterone lab was done on 02/19/22 Last refill was done on 11/08/2021 Please advise

## 2022-07-26 ENCOUNTER — Ambulatory Visit (INDEPENDENT_AMBULATORY_CARE_PROVIDER_SITE_OTHER): Payer: Medicare HMO | Admitting: Family Medicine

## 2022-07-26 ENCOUNTER — Encounter: Payer: Self-pay | Admitting: Family Medicine

## 2022-07-26 VITALS — BP 144/82 | HR 88 | Temp 98.2°F | Wt 235.4 lb

## 2022-07-26 DIAGNOSIS — S70211A Abrasion, right hip, initial encounter: Secondary | ICD-10-CM

## 2022-07-26 DIAGNOSIS — H6992 Unspecified Eustachian tube disorder, left ear: Secondary | ICD-10-CM

## 2022-07-26 DIAGNOSIS — Z96643 Presence of artificial hip joint, bilateral: Secondary | ICD-10-CM

## 2022-07-26 DIAGNOSIS — S70211D Abrasion, right hip, subsequent encounter: Secondary | ICD-10-CM | POA: Diagnosis not present

## 2022-07-26 NOTE — Progress Notes (Signed)
   Established Patient Office Visit   Subjective  Patient ID: [name], male    DOB: 1960-08-06  Age: 62 y.o. MRN: [id number]  Chief Complaint  Patient presents with   Tinnitus    Started Tuesday night, states it was humin in house and turned air on low and was using CPAP/.  Wants to get a spot on hip looked at, it is bleeding from hip incision.     Patient is a 62 year old male followed by Dr. Clent Ridges and seen for acute concern.  Patient endorses using his CPAP for the first time in a while on Tuesday.  Over the next few days pt noticed a clogged sensation in left ear.  Pt/-for symptoms.  Denies fever, chills, headaches, facial pain/pressure, sore throat, rhinorrhea.  Patient notes history of bilateral THR with right hip 12 years ago and left hip 10 years ago.  Patient noticed an area along incision on right hip that will not heal.  Patient thinks he may have scratched the area initially.  Using alcohol on the area after showering.  Denies pain, drainage.  Unable to see the area due to history of spinal fusion.      ROS Negative unless stated above    Objective:     BP (!) 144/82 (BP Location: Left Arm, Patient Position: Sitting, Cuff Size: Large)   Pulse 88   Temp 98.2 F (36.8 C) (Oral)   Wt 235 lb 6.4 oz (106.8 kg)   SpO2 95%   BMI 33.78 kg/m    Physical Exam Constitutional:      General: He is not in acute distress.    Appearance: Normal appearance.  HENT:     Head: Normocephalic and atraumatic.     Ears:     Comments: L TM full.    Nose: Nose normal.     Mouth/Throat:     Mouth: Mucous membranes are moist.  Cardiovascular:     Rate and Rhythm: Normal rate and regular rhythm.     Heart sounds: No murmur heard.    No gallop.  Pulmonary:     Effort: Pulmonary effort is normal. No respiratory distress.     Breath sounds: No wheezing, rhonchi or rales.  Skin:    General: Skin is warm and dry.          Comments: Well-healed surgical incisions on right hip.   Posterior incision with 7 mm healing abrasion.  No erythema, drainage, induration, or TTP.  Neurological:     Mental Status: He is alert and oriented to person, place, and time.      No results found for any visits on 07/26/22.    Assessment & Plan:  Eustachian tube dysfunction, left -New issue -OTC antihistamine or nasal spray such as Flonase. -Given sample of Zyrtec in clinic -Given handout  Abrasion of right hip, initial encounter -New issue -Healing -Patient advised to discontinue use of alcohol as likely drying out area and making it difficult to heal. -Discussed applying a gentle moisturizer to skin such as Vaseline sparingly. -If area has failed to heal in the next 1-2 weeks follow-up in clinic.  History of total replacement of both hip joints  Return if symptoms worsen or fail to improve.   Deeann Saint, MD

## 2022-08-01 ENCOUNTER — Other Ambulatory Visit: Payer: Self-pay | Admitting: Family Medicine

## 2022-08-09 ENCOUNTER — Other Ambulatory Visit: Payer: Self-pay | Admitting: Family Medicine

## 2022-08-09 DIAGNOSIS — I10 Essential (primary) hypertension: Secondary | ICD-10-CM

## 2022-08-24 ENCOUNTER — Encounter: Payer: Self-pay | Admitting: Family Medicine

## 2022-08-24 ENCOUNTER — Ambulatory Visit (INDEPENDENT_AMBULATORY_CARE_PROVIDER_SITE_OTHER): Payer: Medicare HMO | Admitting: Family Medicine

## 2022-08-24 VITALS — BP 128/82 | HR 80 | Temp 98.5°F | Wt 232.0 lb

## 2022-08-24 DIAGNOSIS — D649 Anemia, unspecified: Secondary | ICD-10-CM

## 2022-08-24 DIAGNOSIS — E291 Testicular hypofunction: Secondary | ICD-10-CM | POA: Diagnosis not present

## 2022-08-24 DIAGNOSIS — C61 Malignant neoplasm of prostate: Secondary | ICD-10-CM

## 2022-08-24 DIAGNOSIS — E785 Hyperlipidemia, unspecified: Secondary | ICD-10-CM

## 2022-08-24 DIAGNOSIS — I10 Essential (primary) hypertension: Secondary | ICD-10-CM | POA: Diagnosis not present

## 2022-08-24 LAB — IBC + FERRITIN
Ferritin: 11.7 ng/mL — ABNORMAL LOW (ref 22.0–322.0)
Iron: 33 ug/dL — ABNORMAL LOW (ref 42–165)
Saturation Ratios: 6.5 % — ABNORMAL LOW (ref 20.0–50.0)
TIBC: 509.6 ug/dL — ABNORMAL HIGH (ref 250.0–450.0)
Transferrin: 364 mg/dL — ABNORMAL HIGH (ref 212.0–360.0)

## 2022-08-24 LAB — LIPID PANEL
Cholesterol: 184 mg/dL (ref 0–200)
HDL: 25 mg/dL — ABNORMAL LOW (ref >39.00)
LDL Cholesterol: 138 mg/dL — ABNORMAL HIGH (ref 0–99)
NonHDL: 158.83
Total CHOL/HDL Ratio: 7
Triglycerides: 106 mg/dL (ref 0.0–149.0)
VLDL: 21.2 mg/dL (ref 0.0–40.0)

## 2022-08-24 LAB — TESTOSTERONE: Testosterone: 690.58 ng/dL (ref 300.00–890.00)

## 2022-08-24 LAB — PSA: PSA: 0.01 ng/mL — ABNORMAL LOW (ref 0.10–4.00)

## 2022-08-24 NOTE — Progress Notes (Signed)
   Subjective:    Patient ID: [name], male    DOB: 1960/07/28, 62 y.o.   MRN: [id number]  HPI Here to follow up on HTN, lipids, and cholesterol. He feels great. He is eating a strict diet and working out.    Review of Systems  Constitutional: Negative.   Respiratory: Negative.    Cardiovascular: Negative.        Objective:   Physical Exam Constitutional:      Appearance: Normal appearance.  Cardiovascular:     Rate and Rhythm: Normal rate and regular rhythm.     Pulses: Normal pulses.     Heart sounds: Normal heart sounds.  Pulmonary:     Effort: Pulmonary effort is normal.     Breath sounds: Normal breath sounds.  Neurological:     Mental Status: He is alert.           Assessment & Plan:  His HTN is stable. We will get fasting labs to check lipids, hormone levels, etc.  Gershon Crane, MD

## 2022-08-25 LAB — ESTRADIOL: Estradiol: 21 pg/mL (ref <=39)

## 2022-08-28 ENCOUNTER — Other Ambulatory Visit: Payer: Self-pay | Admitting: *Deleted

## 2022-08-28 MED ORDER — IRON (FERROUS SULFATE) 325 (65 FE) MG PO TABS: 325.0000 mg | ORAL_TABLET | Freq: Two times a day (BID) | ORAL | 11 refills | Status: DC

## 2022-09-07 DIAGNOSIS — R790 Abnormal level of blood mineral: Secondary | ICD-10-CM | POA: Diagnosis not present

## 2022-09-07 DIAGNOSIS — K625 Hemorrhage of anus and rectum: Secondary | ICD-10-CM | POA: Diagnosis not present

## 2022-09-11 ENCOUNTER — Other Ambulatory Visit: Payer: Self-pay | Admitting: Family Medicine

## 2022-09-12 ENCOUNTER — Other Ambulatory Visit: Payer: Self-pay | Admitting: Gastroenterology

## 2022-09-12 DIAGNOSIS — R7401 Elevation of levels of liver transaminase levels: Secondary | ICD-10-CM

## 2022-09-20 ENCOUNTER — Ambulatory Visit
Admission: RE | Admit: 2022-09-20 | Discharge: 2022-09-20 | Disposition: A | Discharge status: Home / Self Care | Payer: Medicare HMO | Source: Ambulatory Visit | Attending: Gastroenterology | Admitting: Gastroenterology

## 2022-09-20 DIAGNOSIS — K8020 Calculus of gallbladder without cholecystitis without obstruction: Secondary | ICD-10-CM | POA: Diagnosis not present

## 2022-09-20 DIAGNOSIS — R7401 Elevation of levels of liver transaminase levels: Secondary | ICD-10-CM

## 2022-09-20 DIAGNOSIS — R7989 Other specified abnormal findings of blood chemistry: Secondary | ICD-10-CM | POA: Diagnosis not present

## 2022-09-24 DIAGNOSIS — R945 Abnormal results of liver function studies: Secondary | ICD-10-CM | POA: Diagnosis not present

## 2022-09-24 DIAGNOSIS — D649 Anemia, unspecified: Secondary | ICD-10-CM | POA: Diagnosis not present

## 2022-09-24 DIAGNOSIS — K625 Hemorrhage of anus and rectum: Secondary | ICD-10-CM | POA: Diagnosis not present

## 2022-09-24 DIAGNOSIS — R7989 Other specified abnormal findings of blood chemistry: Secondary | ICD-10-CM | POA: Diagnosis not present

## 2022-09-24 DIAGNOSIS — R195 Other fecal abnormalities: Secondary | ICD-10-CM | POA: Diagnosis not present

## 2022-09-24 DIAGNOSIS — Z791 Long term (current) use of non-steroidal anti-inflammatories (NSAID): Secondary | ICD-10-CM | POA: Diagnosis not present

## 2022-09-24 DIAGNOSIS — K219 Gastro-esophageal reflux disease without esophagitis: Secondary | ICD-10-CM | POA: Diagnosis not present

## 2022-10-01 ENCOUNTER — Telehealth: Payer: Self-pay | Admitting: Family Medicine

## 2022-10-01 NOTE — Telephone Encounter (Signed)
Pt is calling and has dental appt on 10-04-2022 and has had a hip replacement and need 4 abx amoxicillin sent to  [id number] - [place], [place] - [address] Phone: [phone]  Fax: [phone]

## 2022-10-04 MED ORDER — AMOXICILLIN 500 MG PO TABS
ORAL_TABLET | ORAL | 5 refills | Status: DC
Start: 2022-10-04 — End: 2022-10-25

## 2022-10-04 NOTE — Telephone Encounter (Signed)
Done

## 2022-10-04 NOTE — Telephone Encounter (Signed)
Left message on machine for patient informing him that a prescription has been sent in.

## 2022-10-11 DIAGNOSIS — Z96641 Presence of right artificial hip joint: Secondary | ICD-10-CM | POA: Diagnosis not present

## 2022-10-11 DIAGNOSIS — M25551 Pain in right hip: Secondary | ICD-10-CM | POA: Diagnosis not present

## 2022-10-18 DIAGNOSIS — K625 Hemorrhage of anus and rectum: Secondary | ICD-10-CM | POA: Diagnosis not present

## 2022-10-18 DIAGNOSIS — Q438 Other specified congenital malformations of intestine: Secondary | ICD-10-CM | POA: Diagnosis not present

## 2022-10-18 DIAGNOSIS — K2930 Chronic superficial gastritis without bleeding: Secondary | ICD-10-CM | POA: Diagnosis not present

## 2022-10-18 DIAGNOSIS — D509 Iron deficiency anemia, unspecified: Secondary | ICD-10-CM | POA: Diagnosis not present

## 2022-10-18 DIAGNOSIS — K2950 Unspecified chronic gastritis without bleeding: Secondary | ICD-10-CM | POA: Diagnosis not present

## 2022-10-18 DIAGNOSIS — B9681 Helicobacter pylori [H. pylori] as the cause of diseases classified elsewhere: Secondary | ICD-10-CM | POA: Diagnosis not present

## 2022-10-18 DIAGNOSIS — B3781 Candidal esophagitis: Secondary | ICD-10-CM | POA: Diagnosis not present

## 2022-10-18 DIAGNOSIS — R12 Heartburn: Secondary | ICD-10-CM | POA: Diagnosis not present

## 2022-10-18 DIAGNOSIS — K229 Disease of esophagus, unspecified: Secondary | ICD-10-CM | POA: Diagnosis not present

## 2022-10-18 HISTORY — PX: ESOPHAGOGASTRODUODENOSCOPY: SHX1529

## 2022-10-18 HISTORY — PX: COLONOSCOPY: SHX174

## 2022-10-18 LAB — HM COLONOSCOPY

## 2022-10-22 ENCOUNTER — Emergency Department (HOSPITAL_BASED_OUTPATIENT_CLINIC_OR_DEPARTMENT_OTHER): Payer: Medicare HMO

## 2022-10-22 ENCOUNTER — Other Ambulatory Visit (HOSPITAL_BASED_OUTPATIENT_CLINIC_OR_DEPARTMENT_OTHER): Payer: Self-pay

## 2022-10-22 ENCOUNTER — Other Ambulatory Visit: Payer: Self-pay

## 2022-10-22 ENCOUNTER — Encounter (HOSPITAL_BASED_OUTPATIENT_CLINIC_OR_DEPARTMENT_OTHER): Payer: Self-pay | Admitting: Emergency Medicine

## 2022-10-22 ENCOUNTER — Inpatient Hospital Stay (HOSPITAL_BASED_OUTPATIENT_CLINIC_OR_DEPARTMENT_OTHER)
Admission: EM | Admit: 2022-10-22 | Discharge: 2022-10-25 | DRG: 392 | Disposition: A | Discharge status: Home / Self Care | Payer: Medicare HMO | Attending: Internal Medicine | Admitting: Internal Medicine

## 2022-10-22 DIAGNOSIS — Z79899 Other long term (current) drug therapy: Secondary | ICD-10-CM

## 2022-10-22 DIAGNOSIS — K5792 Diverticulitis of intestine, part unspecified, without perforation or abscess without bleeding: Secondary | ICD-10-CM | POA: Diagnosis not present

## 2022-10-22 DIAGNOSIS — Z888 Allergy status to other drugs, medicaments and biological substances status: Secondary | ICD-10-CM

## 2022-10-22 DIAGNOSIS — E669 Obesity, unspecified: Secondary | ICD-10-CM | POA: Diagnosis present

## 2022-10-22 DIAGNOSIS — I10 Essential (primary) hypertension: Secondary | ICD-10-CM | POA: Diagnosis not present

## 2022-10-22 DIAGNOSIS — K5720 Diverticulitis of large intestine with perforation and abscess without bleeding: Secondary | ICD-10-CM | POA: Diagnosis not present

## 2022-10-22 DIAGNOSIS — Z808 Family history of malignant neoplasm of other organs or systems: Secondary | ICD-10-CM

## 2022-10-22 DIAGNOSIS — Z6832 Body mass index (BMI) 32.0-32.9, adult: Secondary | ICD-10-CM

## 2022-10-22 DIAGNOSIS — Z79811 Long term (current) use of aromatase inhibitors: Secondary | ICD-10-CM

## 2022-10-22 DIAGNOSIS — Z96643 Presence of artificial hip joint, bilateral: Secondary | ICD-10-CM | POA: Diagnosis present

## 2022-10-22 DIAGNOSIS — K219 Gastro-esophageal reflux disease without esophagitis: Secondary | ICD-10-CM | POA: Diagnosis present

## 2022-10-22 DIAGNOSIS — K5780 Diverticulitis of intestine, part unspecified, with perforation and abscess without bleeding: Secondary | ICD-10-CM | POA: Diagnosis not present

## 2022-10-22 DIAGNOSIS — K2930 Chronic superficial gastritis without bleeding: Secondary | ICD-10-CM | POA: Diagnosis not present

## 2022-10-22 DIAGNOSIS — M545 Low back pain, unspecified: Secondary | ICD-10-CM | POA: Diagnosis present

## 2022-10-22 DIAGNOSIS — Z8261 Family history of arthritis: Secondary | ICD-10-CM

## 2022-10-22 DIAGNOSIS — R140 Abdominal distension (gaseous): Secondary | ICD-10-CM | POA: Diagnosis not present

## 2022-10-22 DIAGNOSIS — R109 Unspecified abdominal pain: Secondary | ICD-10-CM | POA: Diagnosis not present

## 2022-10-22 DIAGNOSIS — Z5986 Financial insecurity: Secondary | ICD-10-CM

## 2022-10-22 DIAGNOSIS — D509 Iron deficiency anemia, unspecified: Secondary | ICD-10-CM | POA: Diagnosis not present

## 2022-10-22 DIAGNOSIS — Z885 Allergy status to narcotic agent status: Secondary | ICD-10-CM

## 2022-10-22 DIAGNOSIS — B9681 Helicobacter pylori [H. pylori] as the cause of diseases classified elsewhere: Secondary | ICD-10-CM | POA: Diagnosis not present

## 2022-10-22 DIAGNOSIS — R1030 Lower abdominal pain, unspecified: Secondary | ICD-10-CM | POA: Diagnosis not present

## 2022-10-22 DIAGNOSIS — Z8249 Family history of ischemic heart disease and other diseases of the circulatory system: Secondary | ICD-10-CM | POA: Diagnosis not present

## 2022-10-22 DIAGNOSIS — Z981 Arthrodesis status: Secondary | ICD-10-CM

## 2022-10-22 DIAGNOSIS — Z8546 Personal history of malignant neoplasm of prostate: Secondary | ICD-10-CM | POA: Diagnosis not present

## 2022-10-22 DIAGNOSIS — B3781 Candidal esophagitis: Secondary | ICD-10-CM | POA: Diagnosis not present

## 2022-10-22 DIAGNOSIS — K625 Hemorrhage of anus and rectum: Secondary | ICD-10-CM | POA: Diagnosis not present

## 2022-10-22 DIAGNOSIS — K5732 Diverticulitis of large intestine without perforation or abscess without bleeding: Secondary | ICD-10-CM | POA: Diagnosis not present

## 2022-10-22 LAB — COMPREHENSIVE METABOLIC PANEL WITH GFR
ALT: 60 U/L — ABNORMAL HIGH (ref 0–44)
AST: 37 U/L (ref 15–41)
Albumin: 4.2 g/dL (ref 3.5–5.0)
Alkaline Phosphatase: 68 U/L (ref 38–126)
Anion gap: 12 (ref 5–15)
BUN: 23 mg/dL — ABNORMAL HIGH (ref 6–20)
CO2: 26 mmol/L (ref 22–32)
Calcium: 9.9 mg/dL (ref 8.9–10.3)
Chloride: 98 mmol/L (ref 98–111)
Creatinine, Ser: 0.72 mg/dL (ref 0.61–1.24)
GFR, Estimated: >60 mL/min
Glucose, Bld: 105 mg/dL — ABNORMAL HIGH (ref 70–99)
Potassium: 3.9 mmol/L (ref 3.5–5.1)
Sodium: 136 mmol/L (ref 135–145)
Total Bilirubin: 0.6 mg/dL (ref 0.3–1.2)
Total Protein: 7.7 g/dL (ref 6.5–8.1)

## 2022-10-22 LAB — URINALYSIS, ROUTINE W REFLEX MICROSCOPIC
Bacteria, UA: NONE SEEN
Bilirubin Urine: NEGATIVE
Glucose, UA: NEGATIVE mg/dL
Ketones, ur: NEGATIVE mg/dL
Leukocytes,Ua: NEGATIVE
Nitrite: NEGATIVE
Protein, ur: 30 mg/dL — AB
Specific Gravity, Urine: 1.021 (ref 1.005–1.030)
pH: 6.5 (ref 5.0–8.0)

## 2022-10-22 LAB — CBC
HCT: 40.8 % (ref 39.0–52.0)
Hemoglobin: 12.8 g/dL — ABNORMAL LOW (ref 13.0–17.0)
MCH: 20.9 pg — ABNORMAL LOW (ref 26.0–34.0)
MCHC: 31.4 g/dL (ref 30.0–36.0)
MCV: 66.7 fL — ABNORMAL LOW (ref 80.0–100.0)
Platelets: 319 K/uL (ref 150–400)
RBC: 6.12 MIL/uL — ABNORMAL HIGH (ref 4.22–5.81)
RDW: 22.6 % — ABNORMAL HIGH (ref 11.5–15.5)
WBC: 10.9 K/uL — ABNORMAL HIGH (ref 4.0–10.5)
nRBC: 0 % (ref 0.0–0.2)

## 2022-10-22 LAB — LIPASE, BLOOD: Lipase: 16 U/L (ref 11–51)

## 2022-10-22 MED ORDER — ACETAMINOPHEN 650 MG RE SUPP: 650.0000 mg | Freq: Four times a day (QID) | RECTAL | Status: DC | PRN

## 2022-10-22 MED ORDER — SENNOSIDES-DOCUSATE SODIUM 8.6-50 MG PO TABS
1.0000 | ORAL_TABLET | Freq: Every evening | ORAL | Status: DC | PRN
  Administered 2022-10-23: 1 via ORAL
  Filled 2022-10-22: qty 1

## 2022-10-22 MED ORDER — ONDANSETRON HCL 4 MG PO TABS: 4.0000 mg | ORAL_TABLET | Freq: Four times a day (QID) | ORAL | Status: DC | PRN

## 2022-10-22 MED ORDER — ALBUTEROL SULFATE (2.5 MG/3ML) 0.083% IN NEBU: 2.5000 mg | INHALATION_SOLUTION | RESPIRATORY_TRACT | Status: DC | PRN

## 2022-10-22 MED ORDER — SODIUM CHLORIDE 0.9 % IV SOLN: INTRAVENOUS | Status: DC

## 2022-10-22 MED ORDER — TRAMADOL HCL 50 MG PO TABS
50.0000 mg | ORAL_TABLET | Freq: Four times a day (QID) | ORAL | Status: AC | PRN
  Administered 2022-10-22 – 2022-10-23 (×2): 50 mg via ORAL
  Filled 2022-10-22 (×3): qty 1

## 2022-10-22 MED ORDER — IRBESARTAN 75 MG PO TABS
75.0000 mg | ORAL_TABLET | Freq: Every day | ORAL | Status: DC
  Administered 2022-10-23 – 2022-10-24 (×2): 75 mg via ORAL
  Filled 2022-10-22 (×3): qty 1

## 2022-10-22 MED ORDER — ACETAMINOPHEN 325 MG PO TABS
650.0000 mg | ORAL_TABLET | Freq: Four times a day (QID) | ORAL | Status: DC | PRN
  Administered 2022-10-22 – 2022-10-25 (×6): 650 mg via ORAL
  Filled 2022-10-22 (×6): qty 2

## 2022-10-22 MED ORDER — PIPERACILLIN-TAZOBACTAM 3.375 G IVPB
3.3750 g | Freq: Once | INTRAVENOUS | Status: AC
  Administered 2022-10-22: 3.375 g via INTRAVENOUS
  Filled 2022-10-22: qty 50

## 2022-10-22 MED ORDER — SODIUM CHLORIDE 0.9 % IV SOLN: INTRAVENOUS | Status: DC | PRN

## 2022-10-22 MED ORDER — PIPERACILLIN-TAZOBACTAM 3.375 G IVPB
3.3750 g | Freq: Three times a day (TID) | INTRAVENOUS | Status: DC
  Administered 2022-10-22 – 2022-10-25 (×8): 3.375 g via INTRAVENOUS
  Filled 2022-10-22 (×8): qty 50

## 2022-10-22 MED ORDER — FERROUS SULFATE 325 (65 FE) MG PO TABS
325.0000 mg | ORAL_TABLET | Freq: Two times a day (BID) | ORAL | Status: DC
  Administered 2022-10-22: 325 mg via ORAL
  Filled 2022-10-22 (×5): qty 1

## 2022-10-22 MED ORDER — IOHEXOL 300 MG/ML  SOLN
100.0000 mL | Freq: Once | INTRAMUSCULAR | Status: AC | PRN
  Administered 2022-10-22: 85 mL via INTRAVENOUS

## 2022-10-22 MED ORDER — ENOXAPARIN SODIUM 40 MG/0.4ML IJ SOSY
40.0000 mg | PREFILLED_SYRINGE | INTRAMUSCULAR | Status: DC
  Administered 2022-10-23 – 2022-10-24 (×2): 40 mg via SUBCUTANEOUS
  Filled 2022-10-22 (×2): qty 0.4

## 2022-10-22 MED ORDER — MORPHINE SULFATE (PF) 2 MG/ML IV SOLN: 1.0000 mg | INTRAVENOUS | Status: DC | PRN

## 2022-10-22 MED ORDER — ONDANSETRON HCL 4 MG/2ML IJ SOLN: 4.0000 mg | Freq: Four times a day (QID) | INTRAMUSCULAR | Status: DC | PRN

## 2022-10-22 NOTE — ED Provider Notes (Signed)
[place] EMERGENCY DEPARTMENT AT [facility] Provider Note   CSN: [id number] Arrival date & time: 10/22/22  7846     History  Chief Complaint  Patient presents with   Abdominal Pain    [name] is a 62 y.o. male.  Patient with history of prostate surgery, cholelithiasis presents to the emergency department for evaluation of abdominal pain, cramping and bloating.  Patient had an upper endoscopy and colonoscopy performed at [facility] 4 days ago.  Patient reports the night before, he started having abdominal cramping with the bowel prep.  He states that he took the first half of the bowel prep within 2 hours.  He had associated stooling.  He had his colonoscopy and upper endoscopy performed the next day without complications.  Patient has report with him, shows suspected esophageal candidiasis.  He reports no polyps or other problems in the large intestine, however they did take biopsies.  Patient has had increasing, persistent intermittent cramps, improved with belching and with urination.  Pain was worse this morning, spoke with GI, encouraged to come to the emergency department.  No fevers.  No chest pain or shortness of breath.  He denies dysuria, increased frequency or urgency, hesitancy.  No blood noted in the stool.       Home Medications Prior to Admission medications   Medication Sig Start Date End Date Taking? Authorizing Provider  amoxicillin (AMOXIL) 500 MG tablet Take 4 tablets the morning of any dental procedures 10/04/22   Nelwyn Salisbury, MD  anastrozole (ARIMIDEX) 1 MG tablet TAKE 1 TABLET BY MOUTH EVERY DAY 09/11/22   Nelwyn Salisbury, MD  Cholecalciferol (VITAMIN D) 125 MCG (5000 UT) CAPS Take 5,000 Units by mouth daily.    [provider]  diclofenac (VOLTAREN) 75 MG EC tablet Take 1 tablet (75 mg total) by mouth 2 (two) times daily. 03/02/22   Nelwyn Salisbury, MD  Iron, Ferrous Sulfate, 325 (65 Fe) MG TABS Take 325 mg by mouth 2 (two) times daily.  08/28/22   Nelwyn Salisbury, MD  omeprazole (PRILOSEC) 40 MG capsule TAKE 1 CAPSULE EVERY DAY 05/28/22   Nelwyn Salisbury, MD  telmisartan (MICARDIS) 20 MG tablet TAKE 1 TABLET BY MOUTH EVERY DAY 08/09/22   Nelwyn Salisbury, MD  testosterone cypionate (DEPOTESTOSTERONE CYPIONATE) 200 MG/ML injection INJECT 1 ML INTO THE MUSCLE EVERY 7 DAYS. 07/05/22   Nelwyn Salisbury, MD  traMADol (ULTRAM) 50 MG tablet TAKE 2 TABLETS EVERY 6 HOURS AS NEEDED FOR PAIN 08/07/22   Nelwyn Salisbury, MD      Allergies    Flexeril [cyclobenzaprine], Oxycodone-acetaminophen, and Oxycodone-acetaminophen    Review of Systems   Review of Systems  Physical Exam Updated Vital Signs BP (!) 154/92 (BP Location: Left Arm)   Pulse (!) 110   Temp 98.5 F (36.9 C) (Oral)   Resp 20   Ht 5\' 10"  (1.778 m)   Wt 102.1 kg   SpO2 99%   BMI 32.28 kg/m   Physical Exam Vitals and nursing note reviewed.  Constitutional:      General: He is not in acute distress.    Appearance: He is well-developed.  HENT:     Head: Normocephalic and atraumatic.  Eyes:     General:        Right eye: No discharge.        Left eye: No discharge.     Conjunctiva/sclera: Conjunctivae normal.  Cardiovascular:     Rate and  Rhythm: Normal rate and regular rhythm.     Heart sounds: Normal heart sounds.  Pulmonary:     Effort: Pulmonary effort is normal.     Breath sounds: Normal breath sounds.  Abdominal:     General: There is distension.     Palpations: Abdomen is soft.     Tenderness: There is generalized abdominal tenderness and tenderness in the right lower quadrant, suprapubic area and left lower quadrant.  Musculoskeletal:     Cervical back: Normal range of motion and neck supple.  Skin:    General: Skin is warm and dry.  Neurological:     Mental Status: He is alert.     ED Results / Procedures / Treatments   Labs (all labs ordered are listed, but only abnormal results are displayed) Labs Reviewed  COMPREHENSIVE METABOLIC PANEL -  Abnormal; Notable for the following components:      Result Value   Glucose, Bld 105 (*)    BUN 23 (*)    ALT 60 (*)    All other components within normal limits  CBC - Abnormal; Notable for the following components:   WBC 10.9 (*)    RBC 6.12 (*)    Hemoglobin 12.8 (*)    MCV 66.7 (*)    MCH 20.9 (*)    RDW 22.6 (*)    All other components within normal limits  URINALYSIS, ROUTINE W REFLEX MICROSCOPIC - Abnormal; Notable for the following components:   Hgb urine dipstick SMALL (*)    Protein, ur 30 (*)    All other components within normal limits  LIPASE, BLOOD    EKG None  Radiology CT ABDOMEN PELVIS W CONTRAST  Result Date: 10/22/2022 CLINICAL DATA:  Lower abdominal pain and diarrhea for several days. Recent colonoscopy. EXAM: CT ABDOMEN AND PELVIS WITH CONTRAST TECHNIQUE: Multidetector CT imaging of the abdomen and pelvis was performed using the standard protocol following bolus administration of intravenous contrast. RADIATION DOSE REDUCTION: This exam was performed according to the departmental dose-optimization program which includes automated exposure control, adjustment of the mA and/or kV according to patient size and/or use of iterative reconstruction technique. CONTRAST:  85mL OMNIPAQUE IOHEXOL 300 MG/ML  SOLN COMPARISON:  09/01/2021 FINDINGS: Lower Chest: No acute findings. Hepatobiliary: No suspicious hepatic masses identified. Gallbladder is unremarkable. No evidence of biliary ductal dilatation. Pancreas:  No mass or inflammatory changes. Spleen: Within normal limits in size and appearance. Adrenals/Urinary Tract: No suspicious masses identified. No evidence of ureteral calculi or hydronephrosis. Stomach/Bowel: Mild-to-moderate diverticulitis of sigmoid colon is demonstrated. Two small rim enhancing fluid collections seen along the posterior and inferior wall of the sigmoid colon measuring 2.9 x 2.4 cm and 3.5 x 2.4 cm, consistent with diverticular abscesses. No evidence of  bowel obstruction. Vascular/Lymphatic: No pathologically enlarged lymph nodes. No acute vascular findings. Reproductive:  Prior prostatectomy noted. Other:  None. Musculoskeletal: No suspicious bone lesions identified. Lower thoracic and lumbosacral spine fusion hardware and bilateral hip prostheses again noted. IMPRESSION: Mild-to-moderate sigmoid diverticulitis, with 2 small adjacent diverticular abscesses. Electronically Signed   By: Danae Orleans M.D.   On: 10/22/2022 12:44    Procedures Procedures    Medications Ordered in ED Medications  piperacillin-tazobactam (ZOSYN) IVPB 3.375 g (3.375 g Intravenous New Bag/Given 10/22/22 1331)  0.9 %  sodium chloride infusion ( Intravenous New Bag/Given 10/22/22 1329)  iohexol (OMNIPAQUE) 300 MG/ML solution 100 mL (85 mLs Intravenous Contrast Given 10/22/22 1058)    ED Course/ Medical Decision Making/ A&P  Patient seen and examined. History obtained directly from patient.  Reviewed previous ultrasound report.  Unable to see endoscopy or colonoscopy results, however patient brought printed results with him and I reviewed results of upper endoscopy.  Labs/EKG: Ordered CBC, CMP, lipase, UA.  Imaging: Ordered CT abdomen pelvis.  Medications/Fluids: None ordered  Most recent vital signs reviewed and are as follows: BP (!) 154/92 (BP Location: Left Arm)   Pulse (!) 110   Temp 98.5 F (36.9 C) (Oral)   Resp 20   Ht 5\' 10"  (1.778 m)   Wt 102.1 kg   SpO2 99%   BMI 32.28 kg/m   Initial impression: Generalized abdominal pain  2:06 PM Reassessment performed. Patient appears stable.  Labs personally reviewed and interpreted including: CBC with minimally elevated white blood cell count of 10.9, hemoglobin 12.8 slightly low, microcytic; CMP normal electrolytes, normal kidney function, ALT slightly elevated at 60 otherwise unremarkable transaminases; lipase normal; UA no sign of infection.   Imaging personally visualized and interpreted including:  CT abdomen and pelvis, agree diverticulitis of the sigmoid colon with associated abscess  Reviewed pertinent lab work and imaging with patient at bedside. Questions answered.   Most current vital signs reviewed and are as follows: BP (!) 141/87 (BP Location: Right Arm)   Pulse 96   Temp 98.4 F (36.9 C) (Oral)   Resp 18 Comment: Simultaneous filing. User may not have seen previous data.  Ht 5\' 10"  (1.778 m)   Wt 102.1 kg   SpO2 97%   BMI 32.28 kg/m   Plan: Admit to hospital.   I have spoken with Dr. Erenest Blank of [facility] hospitalist who accepts patient for admission.  I have sent a secure chat to Dr. Dulce Sellar given recent colonoscopy.                                Medical Decision Making Amount and/or Complexity of Data Reviewed Labs: ordered. Radiology: ordered.  Risk Prescription drug management. Decision regarding hospitalization.   For this patient's complaint of abdominal pain, the following conditions were considered on the differential diagnosis: gastritis/PUD, enteritis/duodenitis, appendicitis, cholelithiasis/cholecystitis, cholangitis, pancreatitis, ruptured viscus, colitis, diverticulitis, small/large bowel obstruction, proctitis, cystitis, pyelonephritis, ureteral colic, aortic dissection, aortic aneurysm. Atypical chest etiologies were also considered including ACS, PE, and pneumonia.  Patient will need admission for complicated diverticulitis.        Final Clinical Impression(s) / ED Diagnoses Final diagnoses:  Abscess of sigmoid colon due to diverticulitis    Rx / DC Orders ED Discharge Orders     None         Renne Crigler, PA-C 10/22/22 1412    Tegeler, Canary Brim, MD 10/22/22 1504

## 2022-10-22 NOTE — ED Triage Notes (Signed)
Pt caox4, ambulatory c/o lower abd pain described as sharp radiating to the center of the back that has been intermittent since having a colonoscopy done Thursday. Pt reports diarrhea since colonoscopy prep, denies N/V.

## 2022-10-22 NOTE — Consult Note (Addendum)
[name] 09-25-1960  [id number].    Requesting MD: Dr. Teena Dunk Chief Complaint/Reason for Consult: diverticulitis with abscess  HPI:  This is a 62 yo male with a history of prostate cancer, GERD, chronic back pain on disability, HTN, and arthritis who presents to the [facility] ED yesterday for abdominal pain.  He actually underwent an EGD and colonoscopy with [facility] 5 days ago.  He had some cramping with the bowel prep.  By report, he had some esophageal candidiasis, but a normal colonoscopy with no polyps but some biopsies were taken.  After this procedure, he continued to have crampy abdominal pain that worsened yesterday morning.  He called the GI office who recommended evaluation in the ED.  He has not had any issues with dysuria, CP, SOB, fevers, chills, etc.  Nothing seems to have made his pain any better.  He denies any blood in his stools during his prep.    His evaluation in the ED was notable for a CT Scan showing diverticulitis with 2 smaller abscesses and a WBC of 10.9.  The rest of his labs were relatively unremarkable.  He was admitted by the medical service.  We have been asked to see for further evaluation and recommendations.  ROS: ROS: Please see HPI  Family History  Problem Relation Age of Onset   Hypertension Father    Suicidality Father    Healthy Mother    Arthritis Other    Cancer Brother        Bone cancer    Past Medical History:  Diagnosis Date   Arthritis    Cancer (HCC)    prostate, sees Dr. Isabel Caprice    Colon polyps    Degenerative disc disease, cervical    Degenerative disc disease, lumbar    Depression    GERD (gastroesophageal reflux disease)    Low back pain    Osteopenia     Past Surgical History:  Procedure Laterality Date   CERVICAL FUSION  2015   C3 through C7, per Dr. Wyline Mood    COLONOSCOPY  03/26/2018    per Dr. Levora Angel, adenomatous polyps, repeat in 3 yrs    COLONOSCOPY  04/01/2018   per Dr. Kathi Der,  adenomatous polyps   CYSTECTOMY     benign, right hip   GYNECOMASTIA EXCISION     bilateral, reduction   HIP SURGERY Right    12/2020   JOINT REPLACEMENT  12/20/2011   hx. LTHA, now RTHA planned   LAMINECTOMY  04/2007   lumbar   LUMBAR FUSION  2015   L4 through S1, per Dr. Wyline Mood    LUMBAR MICRODISCECTOMY  10/15/2008   L-4-5, per Dr. Donette Larry at [facility]   PROSTATE SURGERY  12/13/2010   robotic prostatectomy per Dr. Isabel Caprice    TOTAL HIP ARTHROPLASTY  12/20/2011   2010 left hip   TOTAL HIP ARTHROPLASTY  12/26/2011   Procedure: TOTAL HIP ARTHROPLASTY;  Surgeon: Loanne Drilling, MD;  Location: [facility] ORS;  Service: Orthopedics;  Laterality: Right;    Social History:  reports that he has never smoked. He has never used smokeless tobacco. He reports that he does not drink alcohol and does not use drugs.  Allergies:  Allergies  Allergen Reactions   Flexeril [Cyclobenzaprine]     Weird feeling   Oxycodone-Acetaminophen Nausea Only    dizziness   Oxycodone-Acetaminophen Nausea Only and Other (See Comments)    dizziness Weird feeling;  Hydrocodone on home med  list 7/24 dizziness    (Not in a hospital admission)    Physical Exam: Blood pressure (!) 141/87, pulse 96, temperature 98.4 F (36.9 C), temperature source Oral, resp. rate 18, height 5\' 10"  (1.778 m), weight 102.1 kg, SpO2 97%. General: pleasant, WD, WN white male who is laying in bed in NAD HEENT: head is normocephalic, atraumatic.  Sclera are noninjected.  PERRL.  Ears and nose without any masses or lesions.  Mouth is pink and moist Heart: regular, rate, and rhythm.  Normal s1,s2. No obvious murmurs, gallops, or rubs noted.  Palpable radial and pedal pulses bilaterally Lungs: CTAB, no wheezes, rhonchi, or rales noted.  Respiratory effort nonlabored Abd: distended, tympanitic, few BS, no masses or organomegaly.  Small reducible umbilical hernia MS: all 4 extremities are symmetrical with no cyanosis,  clubbing, or edema. Psych: A&Ox3 with an appropriate affect.   Results for orders placed or performed during the hospital encounter of 10/22/22 (from the past 48 hour(s))  Lipase, blood     Status: None   Collection Time: 10/22/22  9:52 AM  Result Value Ref Range   Lipase 16 11 - 51 U/L    Comment: Performed at [facility], [address] [place], [place] [address]  Comprehensive metabolic panel     Status: Abnormal   Collection Time: 10/22/22  9:52 AM  Result Value Ref Range   Sodium 136 135 - 145 mmol/L   Potassium 3.9 3.5 - 5.1 mmol/L   Chloride 98 98 - 111 mmol/L   CO2 26 22 - 32 mmol/L   Glucose, Bld 105 (H) 70 - 99 mg/dL    Comment: Glucose reference range applies only to samples taken after fasting for at least 8 hours.   BUN 23 (H) 6 - 20 mg/dL   Creatinine, Ser 6.21 0.61 - 1.24 mg/dL   Calcium 9.9 8.9 - 30.8 mg/dL   Total Protein 7.7 6.5 - 8.1 g/dL   Albumin 4.2 3.5 - 5.0 g/dL   AST 37 15 - 41 U/L   ALT 60 (H) 0 - 44 U/L   Alkaline Phosphatase 68 38 - 126 U/L   Total Bilirubin 0.6 0.3 - 1.2 mg/dL   GFR, Estimated >65 >78 mL/min    Comment: (NOTE) Calculated using the CKD-EPI Creatinine Equation (2021)    Anion gap 12 5 - 15    Comment: Performed at [facility], [address] [place], [place] [address]  CBC     Status: Abnormal   Collection Time: 10/22/22  9:52 AM  Result Value Ref Range   WBC 10.9 (H) 4.0 - 10.5 K/uL   RBC 6.12 (H) 4.22 - 5.81 MIL/uL   Hemoglobin 12.8 (L) 13.0 - 17.0 g/dL   HCT 95.2 84.1 - 32.4 %   MCV 66.7 (L) 80.0 - 100.0 fL   MCH 20.9 (L) 26.0 - 34.0 pg   MCHC 31.4 30.0 - 36.0 g/dL   RDW 40.1 (H) 02.7 - 25.3 %   Platelets 319 150 - 400 K/uL   nRBC 0.0 0.0 - 0.2 %    Comment: Performed at [facility], [address] [place], [place] [address]  Urinalysis, Routine w reflex microscopic -Urine, Clean Catch     Status: Abnormal   Collection Time: 10/22/22  9:52 AM  Result Value  Ref Range   Color, Urine YELLOW YELLOW   APPearance CLEAR CLEAR   Specific Gravity, Urine 1.021 1.005 - 1.030   pH 6.5 5.0 - 8.0  Glucose, UA NEGATIVE NEGATIVE mg/dL   Hgb urine dipstick SMALL (A) NEGATIVE   Bilirubin Urine NEGATIVE NEGATIVE   Ketones, ur NEGATIVE NEGATIVE mg/dL   Protein, ur 30 (A) NEGATIVE mg/dL   Nitrite NEGATIVE NEGATIVE   Leukocytes,Ua NEGATIVE NEGATIVE   RBC / HPF 0-5 0 - 5 RBC/hpf   WBC, UA 0-5 0 - 5 WBC/hpf   Bacteria, UA NONE SEEN NONE SEEN   Squamous Epithelial / HPF 0-5 0 - 5 /HPF    Comment: Performed at [facility], [address], [place], [place] [address]   CT ABDOMEN PELVIS W CONTRAST  Result Date: 10/22/2022 CLINICAL DATA:  Lower abdominal pain and diarrhea for several days. Recent colonoscopy. EXAM: CT ABDOMEN AND PELVIS WITH CONTRAST TECHNIQUE: Multidetector CT imaging of the abdomen and pelvis was performed using the standard protocol following bolus administration of intravenous contrast. RADIATION DOSE REDUCTION: This exam was performed according to the departmental dose-optimization program which includes automated exposure control, adjustment of the mA and/or kV according to patient size and/or use of iterative reconstruction technique. CONTRAST:  85mL OMNIPAQUE IOHEXOL 300 MG/ML  SOLN COMPARISON:  09/01/2021 FINDINGS: Lower Chest: No acute findings. Hepatobiliary: No suspicious hepatic masses identified. Gallbladder is unremarkable. No evidence of biliary ductal dilatation. Pancreas:  No mass or inflammatory changes. Spleen: Within normal limits in size and appearance. Adrenals/Urinary Tract: No suspicious masses identified. No evidence of ureteral calculi or hydronephrosis. Stomach/Bowel: Mild-to-moderate diverticulitis of sigmoid colon is demonstrated. Two small rim enhancing fluid collections seen along the posterior and inferior wall of the sigmoid colon measuring 2.9 x 2.4 cm and 3.5 x 2.4 cm, consistent with diverticular  abscesses. No evidence of bowel obstruction. Vascular/Lymphatic: No pathologically enlarged lymph nodes. No acute vascular findings. Reproductive:  Prior prostatectomy noted. Other:  None. Musculoskeletal: No suspicious bone lesions identified. Lower thoracic and lumbosacral spine fusion hardware and bilateral hip prostheses again noted. IMPRESSION: Mild-to-moderate sigmoid diverticulitis, with 2 small adjacent diverticular abscesses. Electronically Signed   By: Danae Orleans M.D.   On: 10/22/2022 12:44      Assessment/Plan Diverticulitis with abscess The patient has been seen, examined, labs, vitals, chart, and imaging personally reviewed.  He appears to have sigmoid diverticulitis with 2 small abscesses inferior and posterior to the sigmoid colon.  I have reached out to IR for review who feels this is actually just one contiguous abscess, but is not amendable to drainage at this time.  Plan at this time would be to proceed with bowel rest as well as IV abx therapy for now as he is hemodynamically stable and does not require emergent intervention.  If he were to worsen he may require repeat imaging, +/- drain placement, or operative intervention if all other attempts at conservative management fail.  The patient is aware and agrees with the current plan.  He is more distended today and his WBC has actually increased to 15K despite zosyn.  I have ordered a plain film to assess his distention a bit more.  If this is worsening, he may need to go back to NPO.     FEN - CLD/IVFs VTE - Lovenox ID - zosyn  GERD HTN H/O prostate cancer Low back pain, on disability arthritis  I reviewed ED provider notes, hospitalist notes, last 24 h vitals and pain scores, last 48 h intake and output, last 24 h labs and trends, and last 24 h imaging results.  Letha Cape, [facility] Surgery 10/22/2022, 3:10 PM Please see Amion  for pager number during day hours 7:00am-4:30pm or 7:00am -11:30am on  weekends

## 2022-10-22 NOTE — H&P (Signed)
History and Physical  [name] VWU:[id number] DOB: 19-Oct-1960 DOA: 10/22/2022  PCP: Nelwyn Salisbury, MD   Chief Complaint: Abdominal pain  HPI: [name] is a 62 y.o. male with medical history significant for treated prostate cancer, GERD, depression being admitted to the hospital with diverticulitis.  He recently had an outpatient colonoscopy for rectal bleeding which was felt to likely be hemorrhoidal.  As he was preparing for his colonoscopy, he had some low pelvic pain.  Colonoscopy went well with no polyps seen and some random biopsies taken per the patient, but afterwards he continued to have gradually worsening abdominal pain.  Denies nausea, fevers, vomiting, chest pain, or any other concerns.  Presented to the ER this morning.  There he was found to be borderline tachycardic and hypertensive, lab work revealed WBC 10.9, hemoglobin 12.8, unremarkable CMP.  CT scan was done which showed moderate diverticulitis with small abscesses.  He was given IV Zosyn, kept n.p.o. and admitted to the hospitalist service at [facility].  Review of Systems: Please see HPI for pertinent positives and negatives. A complete 10 system review of systems are otherwise negative.  Past Medical History:  Diagnosis Date   Arthritis    Cancer [facility])    prostate, sees Dr. Isabel Caprice    Colon polyps    Degenerative disc disease, cervical    Degenerative disc disease, lumbar    Depression    GERD (gastroesophageal reflux disease)    Low back pain    Osteopenia    Past Surgical History:  Procedure Laterality Date   CERVICAL FUSION  2015   C3 through C7, per Dr. Wyline Mood    COLONOSCOPY  03/26/2018    per Dr. Levora Angel, adenomatous polyps, repeat in 3 yrs    COLONOSCOPY  04/01/2018   per Dr. Kathi Der, adenomatous polyps   CYSTECTOMY     benign, right hip   GYNECOMASTIA EXCISION     bilateral, reduction   HIP SURGERY Right    12/2020   JOINT REPLACEMENT  12/20/2011   hx. LTHA, now RTHA  planned   LAMINECTOMY  04/2007   lumbar   LUMBAR FUSION  2015   L4 through S1, per Dr. Wyline Mood    LUMBAR MICRODISCECTOMY  10/15/2008   L-4-5, per Dr. Donette Larry at [facility]   PROSTATE SURGERY  12/13/2010   robotic prostatectomy per Dr. Isabel Caprice    TOTAL HIP ARTHROPLASTY  12/20/2011   2010 left hip   TOTAL HIP ARTHROPLASTY  12/26/2011   Procedure: TOTAL HIP ARTHROPLASTY;  Surgeon: Loanne Drilling, MD;  Location: [facility] ORS;  Service: Orthopedics;  Laterality: Right;    Social History:  reports that he has never smoked. He has never used smokeless tobacco. He reports that he does not drink alcohol and does not use drugs.   Allergies  Allergen Reactions   Flexeril [Cyclobenzaprine]     Weird feeling   Oxycodone-Acetaminophen Nausea Only    dizziness   Oxycodone-Acetaminophen Nausea Only and Other (See Comments)    dizziness Weird feeling;  Hydrocodone on home med list 7/24 dizziness    Family History  Problem Relation Age of Onset   Hypertension Father    Suicidality Father    Healthy Mother    Arthritis Other    Cancer Brother        Bone cancer     Prior to Admission medications   Medication Sig Start Date End Date Taking? Authorizing Provider  amoxicillin (AMOXIL) 500 MG tablet  Take 4 tablets the morning of any dental procedures 10/04/22  Yes Nelwyn Salisbury, MD  anastrozole (ARIMIDEX) 1 MG tablet TAKE 1 TABLET BY MOUTH EVERY DAY Patient taking differently: Take 1 mg by mouth once a week. 09/11/22  Yes Nelwyn Salisbury, MD  Cholecalciferol (VITAMIN D) 125 MCG (5000 UT) CAPS Take 5,000 Units by mouth daily.   Yes [provider]  diclofenac (VOLTAREN) 75 MG EC tablet Take 1 tablet (75 mg total) by mouth 2 (two) times daily. 03/02/22  Yes Nelwyn Salisbury, MD  Iron, Ferrous Sulfate, 325 (65 Fe) MG TABS Take 325 mg by mouth 2 (two) times daily. 08/28/22  Yes Nelwyn Salisbury, MD  telmisartan (MICARDIS) 20 MG tablet TAKE 1 TABLET BY MOUTH EVERY DAY 08/09/22  Yes Nelwyn Salisbury, MD  testosterone cypionate (DEPOTESTOSTERONE CYPIONATE) 200 MG/ML injection INJECT 1 ML INTO THE MUSCLE EVERY 7 DAYS. Patient taking differently: Inject 200 mg into the muscle every 7 (seven) days. 07/05/22  Yes Nelwyn Salisbury, MD  traMADol (ULTRAM) 50 MG tablet TAKE 2 TABLETS EVERY 6 HOURS AS NEEDED FOR PAIN Patient taking differently: Take 100 mg by mouth every 6 (six) hours as needed for moderate pain. 08/07/22  Yes Nelwyn Salisbury, MD  omeprazole (PRILOSEC) 40 MG capsule TAKE 1 CAPSULE EVERY DAY Patient not taking: Reported on 10/22/2022 05/28/22   Nelwyn Salisbury, MD    Physical Exam: BP (!) 152/90 (BP Location: Right Arm)   Pulse 91   Temp 98.4 F (36.9 C) (Oral)   Resp 18 Comment: Simultaneous filing. User may not have seen previous data.  Ht 5\' 10"  (1.778 m)   Wt 102.1 kg   SpO2 98%   BMI 32.28 kg/m   General:  Alert, oriented, calm, in no acute distress, sitting up on the edge of the bed and looks very comfortable Eyes: EOMI, clear conjuctivae, white sclerea Neck: supple, no masses, trachea mildline  Cardiovascular: RRR, no murmurs or rubs, no peripheral edema  Respiratory: clear to auscultation bilaterally, no wheezes, no crackles  Abdomen: soft, nontender, obese, nondistended, normal bowel tones heard  Skin: dry, no rashes  Musculoskeletal: no joint effusions, normal range of motion  Psychiatric: appropriate affect, normal speech  Neurologic: extraocular muscles intact, clear speech, moving all extremities with intact sensorium          Labs on Admission:  Basic Metabolic Panel: Recent Labs  Lab 10/22/22 0952  NA 136  K 3.9  CL 98  CO2 26  GLUCOSE 105*  BUN 23*  CREATININE 0.72  CALCIUM 9.9   Liver Function Tests: Recent Labs  Lab 10/22/22 0952  AST 37  ALT 60*  ALKPHOS 68  BILITOT 0.6  PROT 7.7  ALBUMIN 4.2   Recent Labs  Lab 10/22/22 0952  LIPASE 16   No results for input(s): "AMMONIA" in the last 168 hours. CBC: Recent Labs  Lab  10/22/22 0952  WBC 10.9*  HGB 12.8*  HCT 40.8  MCV 66.7*  PLT 319   Cardiac Enzymes: No results for input(s): "CKTOTAL", "CKMB", "CKMBINDEX", "TROPONINI" in the last 168 hours.  BNP (last 3 results) No results for input(s): "BNP" in the last 8760 hours.  ProBNP (last 3 results) No results for input(s): "PROBNP" in the last 8760 hours.  CBG: No results for input(s): "GLUCAP" in the last 168 hours.  Radiological Exams on Admission: CT ABDOMEN PELVIS W CONTRAST  Result Date: 10/22/2022 CLINICAL DATA:  Lower abdominal pain and diarrhea for  several days. Recent colonoscopy. EXAM: CT ABDOMEN AND PELVIS WITH CONTRAST TECHNIQUE: Multidetector CT imaging of the abdomen and pelvis was performed using the standard protocol following bolus administration of intravenous contrast. RADIATION DOSE REDUCTION: This exam was performed according to the departmental dose-optimization program which includes automated exposure control, adjustment of the mA and/or kV according to patient size and/or use of iterative reconstruction technique. CONTRAST:  85mL OMNIPAQUE IOHEXOL 300 MG/ML  SOLN COMPARISON:  09/01/2021 FINDINGS: Lower Chest: No acute findings. Hepatobiliary: No suspicious hepatic masses identified. Gallbladder is unremarkable. No evidence of biliary ductal dilatation. Pancreas:  No mass or inflammatory changes. Spleen: Within normal limits in size and appearance. Adrenals/Urinary Tract: No suspicious masses identified. No evidence of ureteral calculi or hydronephrosis. Stomach/Bowel: Mild-to-moderate diverticulitis of sigmoid colon is demonstrated. Two small rim enhancing fluid collections seen along the posterior and inferior wall of the sigmoid colon measuring 2.9 x 2.4 cm and 3.5 x 2.4 cm, consistent with diverticular abscesses. No evidence of bowel obstruction. Vascular/Lymphatic: No pathologically enlarged lymph nodes. No acute vascular findings. Reproductive:  Prior prostatectomy noted. Other:   None. Musculoskeletal: No suspicious bone lesions identified. Lower thoracic and lumbosacral spine fusion hardware and bilateral hip prostheses again noted. IMPRESSION: Mild-to-moderate sigmoid diverticulitis, with 2 small adjacent diverticular abscesses. Electronically Signed   By: Danae Orleans M.D.   On: 10/22/2022 12:44    Assessment/Plan [name] is a 62 y.o. male with medical history significant for treated prostate cancer, GERD, depression being admitted to the hospital with diverticulitis and small associated abscess.  Acute diverticulitis-without sepsis, or significant pain -Inpatient admission -Clear liquid diet -Empiric IV Zosyn -Pain and nausea medication as needed -Discussed with general surgery who will consult in the morning  Hypertension-continue his home ARB, hospital formulary equivalent Avapro  Chronic iron deficiency anemia-continue oral iron supplementation  Obesity-BMI 32.28, complicating all aspects of care  DVT prophylaxis: Lovenox     Code Status: Full Code  Consults called: None  Admission status: The appropriate patient status for this patient is INPATIENT. Inpatient status is judged to be reasonable and necessary in order to provide the required intensity of service to ensure the patient's safety. The patient's presenting symptoms, physical exam findings, and initial radiographic and laboratory data in the context of their chronic comorbidities is felt to place them at high risk for further clinical deterioration. Furthermore, it is not anticipated that the patient will be medically stable for discharge from the hospital within 2 midnights of admission.    I certify that at the point of admission it is my clinical judgment that the patient will require inpatient hospital care spanning beyond 2 midnights from the point of admission due to high intensity of service, high risk for further deterioration and high frequency of surveillance required  Time spent:  53 minutes  Daris Aristizabal Sharlette Dense MD [facility] Pager [phone]  If 7PM-7AM, please contact night-coverage [email or web address] Password [facility]  10/22/2022, 5:09 PM

## 2022-10-22 NOTE — ED Notes (Signed)
[name] with cl called for transport

## 2022-10-22 NOTE — ED Notes (Signed)
Patient transported to CT 

## 2022-10-22 NOTE — Progress Notes (Signed)
PHARMACY NOTE -  Zosyn  Pharmacy has been assisting with dosing of zosyn for intra-abdominal infection. Dosage remains stable at 3.375 g IV q8 hr and further renal adjustments per institutional Pharmacy antibiotic protocol  Pharmacy will sign off, following peripherally for culture results, dose adjustments, and length of therapy. Please reconsult if a change in clinical status warrants re-evaluation of dosage.  Bernadene Person, PharmD, BCPS [phone] 10/22/2022, 4:25 PM

## 2022-10-23 ENCOUNTER — Inpatient Hospital Stay (HOSPITAL_COMMUNITY): Payer: Medicare HMO

## 2022-10-23 DIAGNOSIS — K5720 Diverticulitis of large intestine with perforation and abscess without bleeding: Secondary | ICD-10-CM | POA: Diagnosis not present

## 2022-10-23 DIAGNOSIS — I10 Essential (primary) hypertension: Secondary | ICD-10-CM

## 2022-10-23 DIAGNOSIS — K5792 Diverticulitis of intestine, part unspecified, without perforation or abscess without bleeding: Secondary | ICD-10-CM | POA: Diagnosis not present

## 2022-10-23 LAB — CBC
HCT: 40.7 % (ref 39.0–52.0)
Hemoglobin: 12.5 g/dL — ABNORMAL LOW (ref 13.0–17.0)
MCH: 20.9 pg — ABNORMAL LOW (ref 26.0–34.0)
MCHC: 30.7 g/dL (ref 30.0–36.0)
MCV: 68.1 fL — ABNORMAL LOW (ref 80.0–100.0)
Platelets: 322 10*3/uL (ref 150–400)
RBC: 5.98 MIL/uL — ABNORMAL HIGH (ref 4.22–5.81)
RDW: 22.4 % — ABNORMAL HIGH (ref 11.5–15.5)
WBC: 12.3 10*3/uL — ABNORMAL HIGH (ref 4.0–10.5)
nRBC: 0 % (ref 0.0–0.2)

## 2022-10-23 LAB — BASIC METABOLIC PANEL
Anion gap: 12 (ref 5–15)
BUN: 12 mg/dL (ref 6–20)
CO2: 26 mmol/L (ref 22–32)
Calcium: 9 mg/dL (ref 8.9–10.3)
Chloride: 97 mmol/L — ABNORMAL LOW (ref 98–111)
Creatinine, Ser: 0.92 mg/dL (ref 0.61–1.24)
GFR, Estimated: >60 mL/min (ref >60)
Glucose, Bld: 98 mg/dL (ref 70–99)
Potassium: 4.1 mmol/L (ref 3.5–5.1)
Sodium: 135 mmol/L (ref 135–145)

## 2022-10-23 LAB — HIV ANTIBODY (ROUTINE TESTING W REFLEX): HIV Screen 4th Generation wRfx: NONREACTIVE

## 2022-10-23 MED ORDER — POLYETHYLENE GLYCOL 3350 17 G PO PACK
17.0000 g | PACK | Freq: Two times a day (BID) | ORAL | Status: DC
  Administered 2022-10-23 – 2022-10-25 (×4): 17 g via ORAL
  Filled 2022-10-23 (×5): qty 1

## 2022-10-23 MED ORDER — MORPHINE SULFATE (PF) 2 MG/ML IV SOLN: 1.0000 mg | INTRAVENOUS | Status: DC | PRN

## 2022-10-23 MED ORDER — TRAMADOL HCL 50 MG PO TABS
50.0000 mg | ORAL_TABLET | Freq: Four times a day (QID) | ORAL | Status: DC | PRN
  Administered 2022-10-23 – 2022-10-25 (×6): 50 mg via ORAL
  Filled 2022-10-23 (×6): qty 1

## 2022-10-23 NOTE — Progress Notes (Signed)
  Progress Note   Patient: [name] ZOX:[id number] DOB: [birth year] DOA: 10/22/2022     1 DOS: the patient was seen and examined on 10/23/2022   Brief hospital course: 62 year old man PMH including prostate cancer, presented with abdominal pain.  Recent colonoscopy for rectal bleeding.  Imaging revealed diverticulitis with small abscesses.  Admitted for IV antibiotics, GI and surgical consultations.  Consultants General surgery GI  Procedures  Assessment and Plan: Acute diverticulitis with abscess Not amenable to drainage.  Continue empiric antibiotics.  Nonsurgical management.  Appreciate general surgery and GI consultations.  Essential hypertension- Stable.   Chronic iron deficiency anemia- Stable.   Obesity-BMI 32.28, complicating all aspects of care     Subjective:  Feels better  Physical Exam: Vitals:   10/22/22 2101 10/23/22 0105 10/23/22 0617 10/23/22 1337  BP:  133/77 (!) 145/82 (!) 154/83  Pulse:  98 89 94  Resp: 18 18 18    Temp:  98.6 F (37 C) 98.5 F (36.9 C) 98.6 F (37 C)  TempSrc:  Oral Oral Oral  SpO2:  98% 99% 98%  Weight:      Height:       Physical Exam Vitals reviewed.  Constitutional:      General: He is not in acute distress.    Appearance: He is not ill-appearing or toxic-appearing.  Cardiovascular:     Rate and Rhythm: Normal rate and regular rhythm.     Heart sounds: No murmur heard. Pulmonary:     Effort: Pulmonary effort is normal. No respiratory distress.     Breath sounds: No wheezing, rhonchi or rales.  Abdominal:     Tenderness: There is no abdominal tenderness.  Neurological:     Mental Status: He is alert.  Psychiatric:        Mood and Affect: Mood normal.        Behavior: Behavior normal.   Data Reviewed: BMP noted CBC noted. WBC up to 12.5  Family Communication: none  Disposition: Status is: Inpatient Remains inpatient appropriate because: diverticulitis with abdominal pain     Time spent: 20  minutes  Author: Brendia Sacks, MD 10/23/2022 4:45 PM  For on call review [email or web address].

## 2022-10-23 NOTE — Consult Note (Signed)
[name] Consultation Note  Referring Provider: [facility] Primary Care Physician:  Nelwyn Salisbury, MD Primary Gastroenterologist:  Dr. Levora Angel  Reason for Consultation:  diverticulitis, abscess  HPI: [name] is a 62 y.o. male admitted abdominal distention and lower abdominal discomfort for the past several days.  Started during bowel prep for colonoscopy, worsened about one day post colonoscopy.  No fevers.  Some nausea; no vomiting.  No blood in stool.   Past Medical History:  Diagnosis Date   Arthritis    Cancer [facility])    prostate, sees Dr. Isabel Caprice    Colon polyps    Degenerative disc disease, cervical    Degenerative disc disease, lumbar    Depression    GERD (gastroesophageal reflux disease)    Low back pain    Osteopenia     Past Surgical History:  Procedure Laterality Date   CERVICAL FUSION  2015   C3 through C7, per Dr. Wyline Mood    COLONOSCOPY  03/26/2018    per Dr. Levora Angel, adenomatous polyps, repeat in 3 yrs    COLONOSCOPY  04/01/2018   per Dr. Kathi Der, adenomatous polyps   CYSTECTOMY     benign, right hip   GYNECOMASTIA EXCISION     bilateral, reduction   HIP SURGERY Right    12/2020   JOINT REPLACEMENT  12/20/2011   hx. LTHA, now RTHA planned   LAMINECTOMY  04/2007   lumbar   LUMBAR FUSION  2015   L4 through S1, per Dr. Wyline Mood    LUMBAR MICRODISCECTOMY  10/15/2008   L-4-5, per Dr. Donette Larry at [facility]   PROSTATE SURGERY  12/13/2010   robotic prostatectomy per Dr. Isabel Caprice    TOTAL HIP ARTHROPLASTY  12/20/2011   2010 left hip   TOTAL HIP ARTHROPLASTY  12/26/2011   Procedure: TOTAL HIP ARTHROPLASTY;  Surgeon: Loanne Drilling, MD;  Location: [facility];  Service: Orthopedics;  Laterality: Right;    Prior to Admission medications   Medication Sig Start Date End Date Taking? Authorizing Provider  amoxicillin (AMOXIL) 500 MG tablet Take 4 tablets the morning of any dental procedures 10/04/22  Yes Nelwyn Salisbury, MD  anastrozole (ARIMIDEX) 1 MG tablet TAKE 1 TABLET BY MOUTH EVERY DAY Patient taking differently: Take 1 mg by mouth once a week. 09/11/22  Yes Nelwyn Salisbury, MD  Cholecalciferol (VITAMIN D) 125 MCG (5000 UT) CAPS Take 5,000 Units by mouth daily.   Yes [provider]  diclofenac (VOLTAREN) 75 MG EC tablet Take 1 tablet (75 mg total) by mouth 2 (two) times daily. 03/02/22  Yes Nelwyn Salisbury, MD  Iron, Ferrous Sulfate, 325 (65 Fe) MG TABS Take 325 mg by mouth 2 (two) times daily. 08/28/22  Yes Nelwyn Salisbury, MD  telmisartan (MICARDIS) 20 MG tablet TAKE 1 TABLET BY MOUTH EVERY DAY 08/09/22  Yes Nelwyn Salisbury, MD  testosterone cypionate (DEPOTESTOSTERONE CYPIONATE) 200 MG/ML injection INJECT 1 ML INTO THE MUSCLE EVERY 7 DAYS. Patient taking differently: Inject 200 mg into the muscle every 7 (seven) days. 07/05/22  Yes Nelwyn Salisbury, MD  traMADol (ULTRAM) 50 MG tablet TAKE 2 TABLETS EVERY 6 HOURS AS NEEDED FOR PAIN Patient taking differently: Take 100 mg by mouth every 6 (six) hours as needed for moderate pain. 08/07/22  Yes Nelwyn Salisbury, MD  omeprazole (PRILOSEC) 40 MG capsule TAKE 1 CAPSULE EVERY DAY Patient not taking: Reported on 10/22/2022 05/28/22   Nelwyn Salisbury, MD  Current Facility-Administered Medications  Medication Dose Route Frequency Provider Last Rate Last Admin   0.9 %  sodium chloride infusion   Intravenous PRN Tegeler, Canary Brim, MD 10 mL/hr at 10/22/22 1329 New Bag at 10/22/22 1329   0.9 %  sodium chloride infusion   Intravenous Continuous Kirby Crigler, Mir M, MD 75 mL/hr at 10/22/22 1949 Restarted at 10/22/22 1949   acetaminophen (TYLENOL) tablet 650 mg  650 mg Oral Q6H PRN Maryln Gottron, MD   650 mg at 10/23/22 6578   Or   acetaminophen (TYLENOL) suppository 650 mg  650 mg Rectal Q6H PRN Kirby Crigler, Mir M, MD       albuterol (PROVENTIL) (2.5 MG/3ML) 0.083% nebulizer solution 2.5 mg  2.5 mg Nebulization Q2H PRN Kirby Crigler, Mir M, MD       enoxaparin  (LOVENOX) injection 40 mg  40 mg Subcutaneous Q24H Kirby Crigler, Mir M, MD       ferrous sulfate tablet 325 mg  325 mg Oral BID Kirby Crigler, Mir M, MD   325 mg at 10/22/22 2148   irbesartan (AVAPRO) tablet 75 mg  75 mg Oral Daily Kirby Crigler, Mir M, MD       morphine (PF) 2 MG/ML injection 1 mg  1 mg Intravenous Q3H PRN Kirby Crigler, Mir M, MD       ondansetron [facility]) tablet 4 mg  4 mg Oral Q6H PRN Kirby Crigler, Mir M, MD       Or   ondansetron [facility]) injection 4 mg  4 mg Intravenous Q6H PRN Kirby Crigler, Mir M, MD       piperacillin-tazobactam (ZOSYN) IVPB 3.375 g  3.375 g Intravenous Q8H Kirby Crigler, Mir M, MD 12.5 mL/hr at 10/23/22 0649 3.375 g at 10/23/22 0649   polyethylene glycol (MIRALAX / GLYCOLAX) packet 17 g  17 g Oral BID Standley Brooking, MD   17 g at 10/23/22 1018   senna-docusate (Senokot-S) tablet 1 tablet  1 tablet Oral QHS PRN Maryln Gottron, MD        Allergies as of 10/22/2022 - Review Complete 10/22/2022  Allergen Reaction Noted   Flexeril [cyclobenzaprine]  12/15/2014   Oxycodone-acetaminophen Nausea Only 12/17/2014   Oxycodone-acetaminophen Nausea Only and Other (See Comments) 07/09/2012    Family History  Problem Relation Age of Onset   Hypertension Father    Suicidality Father    Healthy Mother    Arthritis Other    Cancer Brother        Bone cancer    Social History   Socioeconomic History   Marital status: Divorced    Spouse name: Not on file   Number of children: Not on file   Years of education: Not on file   Highest education level: Not on file  Occupational History   Not on file  Tobacco Use   Smoking status: Never   Smokeless tobacco: Never  Vaping Use   Vaping status: Never Used  Substance and Sexual Activity   Alcohol use: No    Alcohol/week: 0.0 standard drinks of alcohol    Comment: rare   Drug use: No   Sexual activity: Yes  Other Topics Concern   Not on file  Social History Narrative   Lives alone in a one story home.  No  children.     On disability since September 2016.  He was previously working in Consulting civil engineer.    Education: high school.   Social Determinants of Health   Financial Resource Strain: Low Risk  (03/07/2022)   Overall Financial  Resource Strain (CARDIA)    Difficulty of Paying Living Expenses: Not hard at all  Recent Concern: Financial Resource Strain - Medium Risk (01/15/2022)   Overall Financial Resource Strain (CARDIA)    Difficulty of Paying Living Expenses: Somewhat hard  Food Insecurity: Patient Declined (10/22/2022)   Hunger Vital Sign    Worried About Running Out of Food in the Last Year: Patient declined    Ran Out of Food in the Last Year: Patient declined  Transportation Needs: Patient Declined (10/22/2022)   PRAPARE - Administrator, Civil Service (Medical): Patient declined    Lack of Transportation (Non-Medical): Patient declined  Physical Activity: Sufficiently Active (03/07/2022)   Exercise Vital Sign    Days of Exercise per Week: 4 days    Minutes of Exercise per Session: 60 min  Recent Concern: Physical Activity - Insufficiently Active (01/15/2022)   Exercise Vital Sign    Days of Exercise per Week: 3 days    Minutes of Exercise per Session: 30 min  Stress: No Stress Concern Present (03/07/2022)   [employer] of Occupational Health - Occupational Stress Questionnaire    Feeling of Stress : Not at all  Social Connections: Socially Isolated (03/07/2022)   Social Connection and Isolation Panel [NHANES]    Frequency of Communication with Friends and Family: More than three times a week    Frequency of Social Gatherings with Friends and Family: More than three times a week    Attends Religious Services: Never    Database administrator or Organizations: No    Attends Banker Meetings: Never    Marital Status: Divorced  Catering manager Violence: Patient Declined (10/22/2022)   Humiliation, Afraid, Rape, and Kick questionnaire    Fear of Current or Ex-Partner:  Patient declined    Emotionally Abused: Patient declined    Physically Abused: Patient declined    Sexually Abused: Patient declined    Review of Systems: As per HPI, all others negative  Physical Exam: Vital signs in last 24 hours: Temp:  [98.4 F (36.9 C)-98.6 F (37 C)] 98.5 F (36.9 C) (08/20 0617) Pulse Rate:  [89-101] 89 (08/20 0617) Resp:  [18] 18 (08/20 0617) BP: (128-153)/(75-90) 145/82 (08/20 0617) SpO2:  [97 %-99 %] 99 % (08/20 0617) Last BM Date : 10/21/22 General:   Alert,  Well-developed, well-nourished, pleasant and cooperative in NAD Head:  Normocephalic and atraumatic. Eyes:  Sclera clear, no icterus.   Conjunctiva pink. Ears:  Normal auditory acuity. Nose:  No deformity, discharge,  or lesions. Mouth:  No deformity or lesions.  Oropharynx pink & moist. Neck:  Supple; no masses or thyromegaly. Lungs:  No respiratory distress Abdomen:  Soft, mild diffuse distention, mild lower abdominal tenderness; no peritonitis No masses, hepatosplenomegaly or hernias noted. Without guarding, and without rebound.     Msk:  Symmetrical without gross deformities. Normal posture. Pulses:  Normal pulses noted. Extremities:  Without clubbing or edema. Neurologic:  Alert and  oriented x4;  grossly normal neurologically. Skin:  Intact without significant lesions or rashes. Psych:  Alert and cooperative. Normal mood and affect.   Lab Results: Recent Labs    10/22/22 0952 10/23/22 0443  WBC 10.9* 12.3*  HGB 12.8* 12.5*  HCT 40.8 40.7  PLT 319 322   BMET Recent Labs    10/22/22 0952 10/23/22 0443  NA 136 135  K 3.9 4.1  CL 98 97*  CO2 26 26  GLUCOSE 105* 98  BUN 23* 12  CREATININE 0.72 0.92  CALCIUM 9.9 9.0   LFT Recent Labs    10/22/22 0952  PROT 7.7  ALBUMIN 4.2  AST 37  ALT 60*  ALKPHOS 68  BILITOT 0.6   PT/INR No results for input(s): "LABPROT", "INR" in the last 72 hours.  Studies/Results: CT ABDOMEN PELVIS W CONTRAST  Result Date:  10/22/2022 CLINICAL DATA:  Lower abdominal pain and diarrhea for several days. Recent colonoscopy. EXAM: CT ABDOMEN AND PELVIS WITH CONTRAST TECHNIQUE: Multidetector CT imaging of the abdomen and pelvis was performed using the standard protocol following bolus administration of intravenous contrast. RADIATION DOSE REDUCTION: This exam was performed according to the departmental dose-optimization program which includes automated exposure control, adjustment of the mA and/or kV according to patient size and/or use of iterative reconstruction technique. CONTRAST:  85mL OMNIPAQUE IOHEXOL 300 MG/ML  SOLN COMPARISON:  09/01/2021 FINDINGS: Lower Chest: No acute findings. Hepatobiliary: No suspicious hepatic masses identified. Gallbladder is unremarkable. No evidence of biliary ductal dilatation. Pancreas:  No mass or inflammatory changes. Spleen: Within normal limits in size and appearance. Adrenals/Urinary Tract: No suspicious masses identified. No evidence of ureteral calculi or hydronephrosis. Stomach/Bowel: Mild-to-moderate diverticulitis of sigmoid colon is demonstrated. Two small rim enhancing fluid collections seen along the posterior and inferior wall of the sigmoid colon measuring 2.9 x 2.4 cm and 3.5 x 2.4 cm, consistent with diverticular abscesses. No evidence of bowel obstruction. Vascular/Lymphatic: No pathologically enlarged lymph nodes. No acute vascular findings. Reproductive:  Prior prostatectomy noted. Other:  None. Musculoskeletal: No suspicious bone lesions identified. Lower thoracic and lumbosacral spine fusion hardware and bilateral hip prostheses again noted. IMPRESSION: Mild-to-moderate sigmoid diverticulitis, with 2 small adjacent diverticular abscesses. Electronically Signed   By: Danae Orleans M.D.   On: 10/22/2022 12:44    Impression:   Diverticulitis. Diverticular abscess  Plan:   IV antibiotics.  No present indication for surgery or IR intervention at the present. Abd xray per surgery  for evaluation of abdominal distention (per patient ongoing for past week or two without bowel obstruction or ileus seen on CT yesterday). Clear liquids. [facility] will follow.   LOS: 1 day   Doristine Shehan M  10/23/2022, 12:14 PM  Cell [phone] If no answer or after 5 PM call [phone]

## 2022-10-23 NOTE — Progress Notes (Signed)
   10/23/22 1039  TOC Brief Assessment  Insurance and Status Reviewed  Patient has primary care physician Yes  Home environment has been reviewed Resides in single family home  Prior level of function: Independent at baseline  Prior/Current Home Services No current home services  Social Determinants of Health Reivew SDOH reviewed no interventions necessary  Readmission risk has been reviewed Yes  Transition of care needs no transition of care needs at this time

## 2022-10-23 NOTE — Hospital Course (Addendum)
62 year old man PMH including prostate cancer, presented with abdominal pain.  Recent colonoscopy for rectal bleeding.  Imaging revealed diverticulitis with small abscesses.  Admitted for IV antibiotics, GI and surgical consultations.  Consultants General surgery GI  Procedures

## 2022-10-23 NOTE — Progress Notes (Signed)
Patient states that he has had some level of opioid dependency in the past, so he wants to avoid stronger narcotics like PO oxy, morphine or dilaudid IV PRN.   Patient was requesting tramadol overnight, received order for 50mg  PRN 3x. Patient usually takes tramadol 100mg  and would like a PRN for muscle spasms, if possible.   Passed along message in report to Dayshift Nurse, Tera Mater.

## 2022-10-24 DIAGNOSIS — K5792 Diverticulitis of intestine, part unspecified, without perforation or abscess without bleeding: Secondary | ICD-10-CM | POA: Diagnosis not present

## 2022-10-24 LAB — CBC
HCT: 41.5 % (ref 39.0–52.0)
Hemoglobin: 12.6 g/dL — ABNORMAL LOW (ref 13.0–17.0)
MCH: 20.6 pg — ABNORMAL LOW (ref 26.0–34.0)
MCHC: 30.4 g/dL (ref 30.0–36.0)
MCV: 67.7 fL — ABNORMAL LOW (ref 80.0–100.0)
Platelets: 332 10*3/uL (ref 150–400)
RBC: 6.13 MIL/uL — ABNORMAL HIGH (ref 4.22–5.81)
RDW: 22.6 % — ABNORMAL HIGH (ref 11.5–15.5)
WBC: 9.9 10*3/uL (ref 4.0–10.5)
nRBC: 0 % (ref 0.0–0.2)

## 2022-10-24 NOTE — Progress Notes (Signed)
Patient stated he has not been eating anything. Just drinking water and apple juice.

## 2022-10-24 NOTE — Progress Notes (Signed)
PROGRESS NOTE    [name]  ZOX:[id number] DOB: [birth year] DOA: 10/22/2022 PCP: Nelwyn Salisbury, MD   Brief Narrative: 62 year old with past medical history significant for prostate cancer, presented with abdominal pain.  Recent colonoscopy for iron deficiency anemia, imaging revealed diverticulitis with a small abscess.  Admitted for IV antibiotics   Assessment & Plan:   Principal Problem:   Diverticulitis  1-Acute diverticulitis with abscess: -Abscess not amenable to drainage. -White blood cell trending down, abdominal pain improved -Plan to continue with IV antibiotics today might be able to be discharged tomorrow if improved  2-essential hypertension: Continue with Avapro  Chronic iron deficiency anemia: Continue with iron supplement Obesity BMI 32: Continue with lifestyle modification History of prostate cancer: Follow-up as an outpatient  Estimated body mass index is 32.28 kg/m as calculated from the following:   Height as of this encounter: 5\' 10"  (1.778 m).   Weight as of this encounter: 102.1 kg.   DVT prophylaxis: Lovenox Code Status: Full code Family Communication: Care discussed with patient Disposition Plan:  Status is: Inpatient Remains inpatient appropriate because: Management of acute diverticulitis    Consultants:  GI Surgery  Procedures:    Antimicrobials:    Subjective: He reports improvement of abdominal pain, having bowel movement. Feels less distended.   Objective: Vitals:   10/23/22 1337 10/23/22 2031 10/24/22 0523 10/24/22 1155  BP: (!) 154/83 (!) 145/81 132/80 (!) 149/100  Pulse: 94 84 76 76  Resp:  18 18 18   Temp: 98.6 F (37 C) 98.5 F (36.9 C) 98 F (36.7 C)   TempSrc: Oral Oral Oral   SpO2: 98% 99% 96% 100%  Weight:      Height:        Intake/Output Summary (Last 24 hours) at 10/24/2022 1524 Last data filed at 10/24/2022 0852 Gross per 24 hour  Intake 1645.33 ml  Output 200 ml  Net 1445.33 ml   Filed Weights    10/22/22 0950  Weight: 102.1 kg    Examination:  General exam: Appears calm and comfortable  Respiratory system: Clear to auscultation. Respiratory effort normal. Cardiovascular system: S1 & S2 heard, RRR. No JVD, murmurs, rubs, gallops or clicks. No pedal edema. Gastrointestinal system: Abdomen is nondistended, soft and nontender. less distended.  Central nervous system: Alert and oriented. No focal neurological deficits. Extremities: Symmetric 5 x 5 power.    Data Reviewed: I have personally reviewed following labs and imaging studies  CBC: Recent Labs  Lab 10/22/22 0952 10/23/22 0443 10/24/22 0641  WBC 10.9* 12.3* 9.9  HGB 12.8* 12.5* 12.6*  HCT 40.8 40.7 41.5  MCV 66.7* 68.1* 67.7*  PLT 319 322 332   Basic Metabolic Panel: Recent Labs  Lab 10/22/22 0952 10/23/22 0443  NA 136 135  K 3.9 4.1  CL 98 97*  CO2 26 26  GLUCOSE 105* 98  BUN 23* 12  CREATININE 0.72 0.92  CALCIUM 9.9 9.0   GFR: Estimated Creatinine Clearance: 102.2 mL/min (by C-G formula based on SCr of 0.92 mg/dL). Liver Function Tests: Recent Labs  Lab 10/22/22 0952  AST 37  ALT 60*  ALKPHOS 68  BILITOT 0.6  PROT 7.7  ALBUMIN 4.2   Recent Labs  Lab 10/22/22 0952  LIPASE 16   No results for input(s): "AMMONIA" in the last 168 hours. Coagulation Profile: No results for input(s): "INR", "PROTIME" in the last 168 hours. Cardiac Enzymes: No results for input(s): "CKTOTAL", "CKMB", "CKMBINDEX", "TROPONINI" in the last 168 hours. BNP (last 3  results) No results for input(s): "PROBNP" in the last 8760 hours. HbA1C: No results for input(s): "HGBA1C" in the last 72 hours. CBG: No results for input(s): "GLUCAP" in the last 168 hours. Lipid Profile: No results for input(s): "CHOL", "HDL", "LDLCALC", "TRIG", "CHOLHDL", "LDLDIRECT" in the last 72 hours. Thyroid Function Tests: No results for input(s): "TSH", "T4TOTAL", "FREET4", "T3FREE", "THYROIDAB" in the last 72 hours. Anemia Panel: No  results for input(s): "VITAMINB12", "FOLATE", "FERRITIN", "TIBC", "IRON", "RETICCTPCT" in the last 72 hours. Sepsis Labs: No results for input(s): "PROCALCITON", "LATICACIDVEN" in the last 168 hours.  No results found for this or any previous visit (from the past 240 hour(s)).       Radiology Studies: DG Abd Portable 1V  Result Date: 10/23/2022 CLINICAL DATA:  Abdominal distension. EXAM: PORTABLE ABDOMEN - 1 VIEW COMPARISON:  None Available. FINDINGS: The bowel gas pattern is normal. No radio-opaque calculi or other significant radiographic abnormality are seen. IMPRESSION: No abnormal bowel dilatation. Electronically Signed   By: Lupita Raider M.D.   On: 10/23/2022 14:56        Scheduled Meds:  enoxaparin (LOVENOX) injection  40 mg Subcutaneous Q24H   ferrous sulfate  325 mg Oral BID   irbesartan  75 mg Oral Daily   polyethylene glycol  17 g Oral BID   Continuous Infusions:  sodium chloride 10 mL/hr at 10/22/22 1329   piperacillin-tazobactam (ZOSYN)  IV 3.375 g (10/24/22 1331)     LOS: 2 days    Time spent: 35 minutes    Tasmia Blumer A Krew Hortman, MD [facility]   If 7PM-7AM, please contact night-coverage [email or web address]  10/24/2022, 3:24 PM

## 2022-10-24 NOTE — Progress Notes (Signed)
Subjective: Feels much better today.  Pain has essentially resolved.  Tolerating CLD but just doesn't like them.  Started moving his bowels and distention resolved today  ROS: See above, otherwise other systems negative  Objective: Vital signs in last 24 hours: Temp:  [98 F (36.7 C)-98.6 F (37 C)] 98 F (36.7 C) (08/21 0523) Pulse Rate:  [76-94] 76 (08/21 0523) Resp:  [18] 18 (08/21 0523) BP: (132-154)/(80-83) 132/80 (08/21 0523) SpO2:  [96 %-99 %] 96 % (08/21 0523) Last BM Date : 10/24/22  Intake/Output from previous day: 08/20 0701 - 08/21 0700 In: 2125.3 [P.O.:2040; IV Piggyback:85.3] Out: -  Intake/Output this shift: Total I/O In: -  Out: 200 [Urine:200]  PE: Abd: soft, NT, ND today, small umbilical hernia stable and reducible  Lab Results:  Recent Labs    10/23/22 0443 10/24/22 0641  WBC 12.3* 9.9  HGB 12.5* 12.6*  HCT 40.7 41.5  PLT 322 332   BMET Recent Labs    10/22/22 0952 10/23/22 0443  NA 136 135  K 3.9 4.1  CL 98 97*  CO2 26 26  GLUCOSE 105* 98  BUN 23* 12  CREATININE 0.72 0.92  CALCIUM 9.9 9.0   PT/INR No results for input(s): "LABPROT", "INR" in the last 72 hours. CMP     Component Value Date/Time   NA 135 10/23/2022 0443   K 4.1 10/23/2022 0443   CL 97 (L) 10/23/2022 0443   CO2 26 10/23/2022 0443   GLUCOSE 98 10/23/2022 0443   BUN 12 10/23/2022 0443   CREATININE 0.92 10/23/2022 0443   CREATININE 1.05 09/05/2010 1711   CALCIUM 9.0 10/23/2022 0443   PROT 7.7 10/22/2022 0952   ALBUMIN 4.2 10/22/2022 0952   AST 37 10/22/2022 0952   ALT 60 (H) 10/22/2022 0952   ALKPHOS 68 10/22/2022 0952   BILITOT 0.6 10/22/2022 0952   GFRNONAA >60 10/23/2022 0443   GFRAA >60 09/09/2019 0450   Lipase     Component Value Date/Time   LIPASE 16 10/22/2022 0952       Studies/Results: DG Abd Portable 1V  Result Date: 10/23/2022 CLINICAL DATA:  Abdominal distension. EXAM: PORTABLE ABDOMEN - 1 VIEW COMPARISON:  None Available.  FINDINGS: The bowel gas pattern is normal. No radio-opaque calculi or other significant radiographic abnormality are seen. IMPRESSION: No abnormal bowel dilatation. Electronically Signed   By: Lupita Raider M.D.   On: 10/23/2022 14:56   CT ABDOMEN PELVIS W CONTRAST  Result Date: 10/22/2022 CLINICAL DATA:  Lower abdominal pain and diarrhea for several days. Recent colonoscopy. EXAM: CT ABDOMEN AND PELVIS WITH CONTRAST TECHNIQUE: Multidetector CT imaging of the abdomen and pelvis was performed using the standard protocol following bolus administration of intravenous contrast. RADIATION DOSE REDUCTION: This exam was performed according to the departmental dose-optimization program which includes automated exposure control, adjustment of the mA and/or kV according to patient size and/or use of iterative reconstruction technique. CONTRAST:  85mL OMNIPAQUE IOHEXOL 300 MG/ML  SOLN COMPARISON:  09/01/2021 FINDINGS: Lower Chest: No acute findings. Hepatobiliary: No suspicious hepatic masses identified. Gallbladder is unremarkable. No evidence of biliary ductal dilatation. Pancreas:  No mass or inflammatory changes. Spleen: Within normal limits in size and appearance. Adrenals/Urinary Tract: No suspicious masses identified. No evidence of ureteral calculi or hydronephrosis. Stomach/Bowel: Mild-to-moderate diverticulitis of sigmoid colon is demonstrated. Two small rim enhancing fluid collections seen along the posterior and inferior wall of the sigmoid colon measuring 2.9 x 2.4 cm and 3.5 x  2.4 cm, consistent with diverticular abscesses. No evidence of bowel obstruction. Vascular/Lymphatic: No pathologically enlarged lymph nodes. No acute vascular findings. Reproductive:  Prior prostatectomy noted. Other:  None. Musculoskeletal: No suspicious bone lesions identified. Lower thoracic and lumbosacral spine fusion hardware and bilateral hip prostheses again noted. IMPRESSION: Mild-to-moderate sigmoid diverticulitis, with 2  small adjacent diverticular abscesses. Electronically Signed   By: Danae Orleans M.D.   On: 10/22/2022 12:44    Anti-infectives: Anti-infectives (From admission, onward)    Start     Dose/Rate Route Frequency Ordered Stop   10/22/22 2200  piperacillin-tazobactam (ZOSYN) IVPB 3.375 g        3.375 g 12.5 mL/hr over 240 Minutes Intravenous Every 8 hours 10/22/22 1625     10/22/22 1330  piperacillin-tazobactam (ZOSYN) IVPB 3.375 g        3.375 g 12.5 mL/hr over 240 Minutes Intravenous  Once 10/22/22 1321 10/22/22 1900        Assessment/Plan Diverticulitis with abscess -doing much better today -adv to FLD this morning and if tolerates, soft diet tonight for dinner -WBC normalized today, AF -cont IV abx therapy today -if doing well with all of this, can likely dc home tomorrow with further oral abx therapy and low fiber diet.   FEN - FLD, ADAT to soft for dinner VTE - Lovenox ID - [email or web address]  I reviewed Consultant GI notes, hospitalist notes, last 24 h vitals and pain scores, last 48 h intake and output, last 24 h labs and trends, and last 24 h imaging results.   LOS: 2 days    Letha Cape , [facility] 10/24/2022, 9:43 AM Please see Amion for pager number during day hours 7:00am-4:30pm or 7:00am -11:30am on weekends

## 2022-10-24 NOTE — Progress Notes (Signed)
Subjective: Improved abdominal distention. Improved abdominal pain.  Objective: Vital signs in last 24 hours: Temp:  [98 F (36.7 C)-98.6 F (37 C)] 98 F (36.7 C) (08/21 0523) Pulse Rate:  [76-94] 76 (08/21 1155) Resp:  [18] 18 (08/21 1155) BP: (132-154)/(80-100) 149/100 (08/21 1155) SpO2:  [96 %-100 %] 100 % (08/21 1155) Weight change:  Last BM Date : 10/24/22  PE: GEN:  NAD ABD:  Soft, non-tender  Lab Results: CBC    Component Value Date/Time   WBC 9.9 10/24/2022 0641   RBC 6.13 (H) 10/24/2022 0641   HGB 12.6 (L) 10/24/2022 0641   HCT 41.5 10/24/2022 0641   PLT 332 10/24/2022 0641   MCV 67.7 (L) 10/24/2022 0641   MCH 20.6 (L) 10/24/2022 0641   MCHC 30.4 10/24/2022 0641   RDW 22.6 (H) 10/24/2022 0641   LYMPHSABS 1.1 02/19/2022 0908   MONOABS 0.9 02/19/2022 0908   EOSABS 0.2 02/19/2022 0908   BASOSABS 0.1 02/19/2022 0908  CMP     Component Value Date/Time   NA 135 10/23/2022 0443   K 4.1 10/23/2022 0443   CL 97 (L) 10/23/2022 0443   CO2 26 10/23/2022 0443   GLUCOSE 98 10/23/2022 0443   BUN 12 10/23/2022 0443   CREATININE 0.92 10/23/2022 0443   CREATININE 1.05 09/05/2010 1711   CALCIUM 9.0 10/23/2022 0443   PROT 7.7 10/22/2022 0952   ALBUMIN 4.2 10/22/2022 0952   AST 37 10/22/2022 0952   ALT 60 (H) 10/22/2022 0952   ALKPHOS 68 10/22/2022 0952   BILITOT 0.6 10/22/2022 0952   GFR 87.39 02/19/2022 0908   GFRNONAA >60 10/23/2022 0443   Assessment:  Diverticulitis. Diverticular abscess  Plan:   IV antibiotics another day, then Augmentin 875 mg po bid x 10 days. Diet advancement per surgery. [facility] will sign-off; we can arrange outpatient follow-up in our office; thank you for the consultation.   [name] 10/24/2022, 12:58 PM   Cell [phone] If no answer or after 5 PM call [phone]

## 2022-10-24 NOTE — Progress Notes (Signed)
Patient's BP at 1155 was 149/100(111 map). Patient's Irbesartan is not due until 1500

## 2022-10-25 DIAGNOSIS — B9681 Helicobacter pylori [H. pylori] as the cause of diseases classified elsewhere: Secondary | ICD-10-CM | POA: Diagnosis not present

## 2022-10-25 DIAGNOSIS — K625 Hemorrhage of anus and rectum: Secondary | ICD-10-CM | POA: Diagnosis not present

## 2022-10-25 DIAGNOSIS — K2930 Chronic superficial gastritis without bleeding: Secondary | ICD-10-CM | POA: Diagnosis not present

## 2022-10-25 DIAGNOSIS — B3781 Candidal esophagitis: Secondary | ICD-10-CM | POA: Diagnosis not present

## 2022-10-25 DIAGNOSIS — K5792 Diverticulitis of intestine, part unspecified, without perforation or abscess without bleeding: Secondary | ICD-10-CM | POA: Diagnosis not present

## 2022-10-25 LAB — CBC
HCT: 44.6 % (ref 39.0–52.0)
Hemoglobin: 13.6 g/dL (ref 13.0–17.0)
MCH: 20.6 pg — ABNORMAL LOW (ref 26.0–34.0)
MCHC: 30.5 g/dL (ref 30.0–36.0)
MCV: 67.7 fL — ABNORMAL LOW (ref 80.0–100.0)
Platelets: 432 10*3/uL — ABNORMAL HIGH (ref 150–400)
RBC: 6.59 MIL/uL — ABNORMAL HIGH (ref 4.22–5.81)
RDW: 23.4 % — ABNORMAL HIGH (ref 11.5–15.5)
WBC: 9.9 10*3/uL (ref 4.0–10.5)
nRBC: 0 % (ref 0.0–0.2)

## 2022-10-25 MED ORDER — OMEPRAZOLE 40 MG PO CPDR: 40.0000 mg | DELAYED_RELEASE_CAPSULE | Freq: Every day | ORAL | 3 refills | Status: DC

## 2022-10-25 MED ORDER — POLYETHYLENE GLYCOL 3350 17 G PO PACK: 17.0000 g | PACK | Freq: Two times a day (BID) | ORAL | 0 refills | Status: DC

## 2022-10-25 MED ORDER — AMOXICILLIN-POT CLAVULANATE 875-125 MG PO TABS: 1.0000 | ORAL_TABLET | Freq: Two times a day (BID) | ORAL | 0 refills | Status: AC

## 2022-10-25 MED ORDER — AMOXICILLIN-POT CLAVULANATE 875-125 MG PO TABS: 1.0000 | ORAL_TABLET | Freq: Two times a day (BID) | ORAL | Status: DC

## 2022-10-25 NOTE — Progress Notes (Signed)
Subjective: Feels well.  Tolerating soft diet with no issues.    Objective: Vital signs in last 24 hours: Temp:  [97.8 F (36.6 C)-98.6 F (37 C)] 97.8 F (36.6 C) (08/22 0628) Pulse Rate:  [76-78] 77 (08/22 0628) Resp:  [18] 18 (08/22 0628) BP: (131-149)/(80-100) 131/82 (08/22 0628) SpO2:  [94 %-100 %] 94 % (08/22 0628) Last BM Date : 10/24/22  Intake/Output from previous day: 08/21 0701 - 08/22 0700 In: 472.9 [P.O.:360; IV Piggyback:112.9] Out: 500 [Urine:500] Intake/Output this shift: Total I/O In: 360 [P.O.:360] Out: -   PE: Abd: soft, NT, ND today, small umbilical hernia stable and reducible  Lab Results:  Recent Labs    10/24/22 0641 10/25/22 0901  WBC 9.9 9.9  HGB 12.6* 13.6  HCT 41.5 44.6  PLT 332 432*   BMET Recent Labs    10/23/22 0443  NA 135  K 4.1  CL 97*  CO2 26  GLUCOSE 98  BUN 12  CREATININE 0.92  CALCIUM 9.0   PT/INR No results for input(s): "LABPROT", "INR" in the last 72 hours. CMP     Component Value Date/Time   NA 135 10/23/2022 0443   K 4.1 10/23/2022 0443   CL 97 (L) 10/23/2022 0443   CO2 26 10/23/2022 0443   GLUCOSE 98 10/23/2022 0443   BUN 12 10/23/2022 0443   CREATININE 0.92 10/23/2022 0443   CREATININE 1.05 09/05/2010 1711   CALCIUM 9.0 10/23/2022 0443   PROT 7.7 10/22/2022 0952   ALBUMIN 4.2 10/22/2022 0952   AST 37 10/22/2022 0952   ALT 60 (H) 10/22/2022 0952   ALKPHOS 68 10/22/2022 0952   BILITOT 0.6 10/22/2022 0952   GFRNONAA >60 10/23/2022 0443   GFRAA >60 09/09/2019 0450   Lipase     Component Value Date/Time   LIPASE 16 10/22/2022 0952       Studies/Results: DG Abd Portable 1V  Result Date: 10/23/2022 CLINICAL DATA:  Abdominal distension. EXAM: PORTABLE ABDOMEN - 1 VIEW COMPARISON:  None Available. FINDINGS: The bowel gas pattern is normal. No radio-opaque calculi or other significant radiographic abnormality are seen. IMPRESSION: No abnormal bowel dilatation. Electronically Signed   By:  Lupita Raider M.D.   On: 10/23/2022 14:56    Anti-infectives: Anti-infectives (From admission, onward)    Start     Dose/Rate Route Frequency Ordered Stop   10/25/22 1200  amoxicillin-clavulanate (AUGMENTIN) 875-125 MG per tablet 1 tablet        1 tablet Oral Every 12 hours 10/25/22 0941     10/25/22 0000  amoxicillin-clavulanate (AUGMENTIN) 875-125 MG tablet        1 tablet Oral Every 12 hours 10/25/22 0942 11/04/22 2359   10/22/22 2200  piperacillin-tazobactam (ZOSYN) IVPB 3.375 g  Status:  Discontinued        3.375 g 12.5 mL/hr over 240 Minutes Intravenous Every 8 hours 10/22/22 1625 10/25/22 0941   10/22/22 1330  piperacillin-tazobactam (ZOSYN) IVPB 3.375 g        3.375 g 12.5 mL/hr over 240 Minutes Intravenous  Once 10/22/22 1321 10/22/22 1900        Assessment/Plan Diverticulitis with abscess -doing well and tolerating soft diet -WBC normalized today, AF -recommend 2 weeks total of abx therapy -surgically stable for Dc home. D/w primary service.  Does not need surgical follow up currently, but we did discuss return precautions such as worsening pain, fevers, etc.  He understands  FEN - soft VTE - Lovenox ID -  zosyn  I reviewed [facility] notes, hospitalist notes, last 24 h vitals and pain scores, last 48 h intake and output, last 24 h labs and trends, and last 24 h imaging results.   LOS: 3 days    Letha Cape , [facility] 10/25/2022, 10:39 AM Please see Amion for pager number during day hours 7:00am-4:30pm or 7:00am -11:30am on weekends

## 2022-10-25 NOTE — Progress Notes (Signed)
Patient discharged home, IV removed, discharge paperwork explained and patient verbalized understanding.

## 2022-10-25 NOTE — Discharge Summary (Signed)
Physician Discharge Summary   Patient: [name] MRN: [id number] DOB: [birth year]  Admit date:     10/22/2022  Discharge date: 10/25/22  Discharge Physician: Alba Cory   PCP: Nelwyn Salisbury, MD   Recommendations at discharge:    Needs to follow up with GI for resolution of diverticulitis.  Follow up result of endoscopy biopsy.   Discharge Diagnoses: Principal Problem:   Diverticulitis  Resolved Problems:   * No resolved hospital problems. *  Hospital Course: 62 year old with past medical history significant for prostate cancer, presented with abdominal pain.  Recent colonoscopy for iron deficiency anemia, imaging revealed diverticulitis with a small abscess.  Admitted for IV antibiotics    Assessment and Plan: 1-Acute diverticulitis with abscess: -Abscess not amenable for  drainage. -White blood cell trending down, abdominal pain improved -plan to discharge on Augmenti for 10 days.    2-essential hypertension: Continue with Avapro   Chronic iron deficiency anemia: Continue with iron supplement Obesity BMI 32: Continue with lifestyle modification History of prostate cancer: Follow-up as an outpatient   Estimated body mass index is 32.28 kg/m as calculated from the following:   Height as of this encounter: 5\' 10"  (1.778 m).   Weight as of this encounter: 102.1 kg.            Consultants: GI, Surgery Procedures performed: none Disposition: Home Diet recommendation:  Discharge Diet Orders (From admission, onward)     Start     Ordered   10/25/22 0000  Diet - low sodium heart healthy        10/25/22 0942           Cardiac diet DISCHARGE MEDICATION: Allergies as of 10/25/2022       Reactions   Flexeril [cyclobenzaprine]    Weird feeling   Oxycodone-acetaminophen Nausea Only   dizziness   Oxycodone-acetaminophen Nausea Only, Other (See Comments)   dizziness Weird feeling;  Hydrocodone on home med list 7/24 dizziness         Medication List     STOP taking these medications    amoxicillin 500 MG tablet Commonly known as: AMOXIL       TAKE these medications    amoxicillin-clavulanate 875-125 MG tablet Commonly known as: AUGMENTIN Take 1 tablet by mouth every 12 (twelve) hours for 10 days.   anastrozole 1 MG tablet Commonly known as: ARIMIDEX TAKE 1 TABLET BY MOUTH EVERY DAY What changed: when to take this   diclofenac 75 MG EC tablet Commonly known as: VOLTAREN Take 1 tablet (75 mg total) by mouth 2 (two) times daily.   Iron (Ferrous Sulfate) 325 (65 Fe) MG Tabs Take 325 mg by mouth 2 (two) times daily.   omeprazole 40 MG capsule Commonly known as: PRILOSEC Take 1 capsule (40 mg total) by mouth daily.   polyethylene glycol 17 g packet Commonly known as: MIRALAX / GLYCOLAX Take 17 g by mouth 2 (two) times daily.   telmisartan 20 MG tablet Commonly known as: MICARDIS TAKE 1 TABLET BY MOUTH EVERY DAY   testosterone cypionate 200 MG/ML injection Commonly known as: DEPOTESTOSTERONE CYPIONATE INJECT 1 ML INTO THE MUSCLE EVERY 7 DAYS. What changed: See the new instructions.   traMADol 50 MG tablet Commonly known as: ULTRAM TAKE 2 TABLETS EVERY 6 HOURS AS NEEDED FOR PAIN What changed: See the new instructions.   Vitamin D 125 MCG (5000 UT) Caps Take 5,000 Units by mouth daily.        Follow-up Information  Nelwyn Salisbury, MD Follow up in 1 week(s).   Specialty: Family Medicine Contact information: [address] [place] [address] [phone]         Kathi Der, MD Follow up in 1 week(s).   Specialty: Gastroenterology Contact information: [address] [place] [address] [place] [address] [phone]                Discharge Exam: Ceasar Mons Weights   10/22/22 0950  Weight: 102.1 kg   General; NAD  Condition at discharge: stable  The results of significant diagnostics from this hospitalization (including imaging, microbiology, ancillary  and laboratory) are listed below for reference.   Imaging Studies: DG Abd Portable 1V  Result Date: 10/23/2022 CLINICAL DATA:  Abdominal distension. EXAM: PORTABLE ABDOMEN - 1 VIEW COMPARISON:  None Available. FINDINGS: The bowel gas pattern is normal. No radio-opaque calculi or other significant radiographic abnormality are seen. IMPRESSION: No abnormal bowel dilatation. Electronically Signed   By: Lupita Raider M.D.   On: 10/23/2022 14:56   CT ABDOMEN PELVIS W CONTRAST  Result Date: 10/22/2022 CLINICAL DATA:  Lower abdominal pain and diarrhea for several days. Recent colonoscopy. EXAM: CT ABDOMEN AND PELVIS WITH CONTRAST TECHNIQUE: Multidetector CT imaging of the abdomen and pelvis was performed using the standard protocol following bolus administration of intravenous contrast. RADIATION DOSE REDUCTION: This exam was performed according to the departmental dose-optimization program which includes automated exposure control, adjustment of the mA and/or kV according to patient size and/or use of iterative reconstruction technique. CONTRAST:  85mL OMNIPAQUE IOHEXOL 300 MG/ML  SOLN COMPARISON:  09/01/2021 FINDINGS: Lower Chest: No acute findings. Hepatobiliary: No suspicious hepatic masses identified. Gallbladder is unremarkable. No evidence of biliary ductal dilatation. Pancreas:  No mass or inflammatory changes. Spleen: Within normal limits in size and appearance. Adrenals/Urinary Tract: No suspicious masses identified. No evidence of ureteral calculi or hydronephrosis. Stomach/Bowel: Mild-to-moderate diverticulitis of sigmoid colon is demonstrated. Two small rim enhancing fluid collections seen along the posterior and inferior wall of the sigmoid colon measuring 2.9 x 2.4 cm and 3.5 x 2.4 cm, consistent with diverticular abscesses. No evidence of bowel obstruction. Vascular/Lymphatic: No pathologically enlarged lymph nodes. No acute vascular findings. Reproductive:  Prior prostatectomy noted. Other:   None. Musculoskeletal: No suspicious bone lesions identified. Lower thoracic and lumbosacral spine fusion hardware and bilateral hip prostheses again noted. IMPRESSION: Mild-to-moderate sigmoid diverticulitis, with 2 small adjacent diverticular abscesses. Electronically Signed   By: Danae Orleans M.D.   On: 10/22/2022 12:44    Microbiology: Results for orders placed or performed in visit on 08/26/20  C. trachomatis/N. gonorrhoeae RNA     Status: None   Collection Time: 08/26/20 10:03 AM   Specimen: Blood  Result Value Ref Range Status   C. trachomatis RNA, TMA NOT DETECTED NOT DETECTED Final   N. gonorrhoeae RNA, TMA NOT DETECTED NOT DETECTED Final    Comment: The analytical performance characteristics of this assay, when used to test SurePath(TM) specimens have been determined by [employer]. The modifications have not been cleared or approved by the FDA. This assay has been validated pursuant to the CLIA regulations and is used for clinical purposes. . For additional information, please refer to https://education.questdiagnostics.com/faq/FAQ154 (This link is being provided for information/ educational purposes only.) .     Labs: CBC: Recent Labs  Lab 10/22/22 0952 10/23/22 0443 10/24/22 0641 10/25/22 0901  WBC 10.9* 12.3* 9.9 9.9  HGB 12.8* 12.5* 12.6* 13.6  HCT 40.8 40.7 41.5 44.6  MCV 66.7* 68.1* 67.7* 67.7*  PLT 319 322 332 432*   Basic Metabolic Panel: Recent Labs  Lab 10/22/22 0952 10/23/22 0443  NA 136 135  K 3.9 4.1  CL 98 97*  CO2 26 26  GLUCOSE 105* 98  BUN 23* 12  CREATININE 0.72 0.92  CALCIUM 9.9 9.0   Liver Function Tests: Recent Labs  Lab 10/22/22 0952  AST 37  ALT 60*  ALKPHOS 68  BILITOT 0.6  PROT 7.7  ALBUMIN 4.2   CBG: No results for input(s): "GLUCAP" in the last 168 hours.  Discharge time spent: greater than 30 minutes.  Signed: Alba Cory, MD [facility] 10/25/2022

## 2022-10-26 ENCOUNTER — Telehealth: Payer: Self-pay

## 2022-10-26 NOTE — Transitions of Care (Post Inpatient/ED Visit) (Signed)
10/26/2022  Name: [name] MRN: [id number] DOB: 02-02-1961  Today's TOC FU Call Status: Today's TOC FU Call Status:: Successful TOC FU Call Completed TOC FU Call Complete Date: 10/26/22 Patient's Name and Date of Birth confirmed.  Transition Care Management Follow-up Telephone Call Date of Discharge: 10/25/22 Discharge Facility: [facility]) Type of Discharge: Inpatient Admission Primary Inpatient Discharge Diagnosis:: "abscess of sigmoid colon due to diverticulitis" How have you been since you were released from the hospital?: Better (Pt pleased to report how well he is doing. He has had a good appetite-already had breakfast,lunch and protein shake today. LBM today. he voices 'just a little bit of abd pain-taking abxs and can tell they are working.") Any questions or concerns?: No  Items Reviewed: Did you receive and understand the discharge instructions provided?: Yes Medications obtained,verified, and reconciled?: Yes (Medications Reviewed) Any new allergies since your discharge?: No Dietary orders reviewed?: Yes Type of Diet Ordered:: low salt/heart healthy-reviewed foods to avoid and eat with diverticulitis Do you have support at home?: Yes People in Home: alone  Medications Reviewed Today: Medications Reviewed Today     Reviewed by Charlyn Minerva, RN (Registered Nurse) on 10/26/22 at 1251  Med List Status: <None>   Medication Order Taking? Sig Documenting Provider Last Dose Status Informant  amoxicillin-clavulanate (AUGMENTIN) 875-125 MG tablet [id number] Yes Take 1 tablet by mouth every 12 (twelve) hours for 10 days. Regalado, Belkys A, MD Taking Active   anastrozole (ARIMIDEX) 1 MG tablet [id number] Yes TAKE 1 TABLET BY MOUTH EVERY DAY  Patient taking differently: Take 1 mg by mouth once a week.   Nelwyn Salisbury, MD Taking Active Self, Pharmacy Records  Cholecalciferol (VITAMIN D) 125 MCG (5000 UT) CAPS [id number] Yes Take 5,000 Units by mouth daily.  [provider] Taking Active Self, Pharmacy Records  diclofenac (VOLTAREN) 75 MG EC tablet [id number] Yes Take 1 tablet (75 mg total) by mouth 2 (two) times daily. Nelwyn Salisbury, MD Taking Active Self, Pharmacy Records  docusate sodium (COLACE) 50 MG capsule [id number] Yes Take 50 mg by mouth daily. [provider] Taking Active Self  Iron, Ferrous Sulfate, 325 (65 Fe) MG TABS [id number] Yes Take 325 mg by mouth 2 (two) times daily. Nelwyn Salisbury, MD Taking Active Self, Pharmacy Records  omeprazole (PRILOSEC) 40 MG capsule [id number] No Take 1 capsule (40 mg total) by mouth daily.  Patient not taking: Reported on 10/26/2022   Hartley Barefoot A, MD Not Taking Active   polyethylene glycol (MIRALAX / GLYCOLAX) 17 g packet [id number] Yes Take 17 g by mouth 2 (two) times daily. Alba Cory, MD Taking Active   telmisartan (MICARDIS) 20 MG tablet [id number] Yes TAKE 1 TABLET BY MOUTH EVERY DAY Nelwyn Salisbury, MD Taking Active Self, Pharmacy Records  testosterone cypionate (DEPOTESTOSTERONE CYPIONATE) 200 MG/ML injection [id number] Yes INJECT 1 ML INTO THE MUSCLE EVERY 7 DAYS.  Patient taking differently: Inject 200 mg into the muscle every 7 (seven) days.   Nelwyn Salisbury, MD Taking Active Self, Pharmacy Records  traMADol Janean Sark) 50 MG tablet [id number] Yes TAKE 2 TABLETS EVERY 6 HOURS AS NEEDED FOR PAIN  Patient taking differently: Take 100 mg by mouth every 6 (six) hours as needed for moderate pain.   Nelwyn Salisbury, MD Taking Active Self, Pharmacy Records            Home Care and Equipment/Supplies: Were Home Health Services Ordered?: NA Any new equipment or medical supplies ordered?: NA  Functional Questionnaire: Do you need assistance with bathing/showering or dressing?: No Do you need assistance with meal preparation?: No Do you need assistance with eating?: No Do you have difficulty maintaining continence: No Do you need assistance with getting out of bed/getting  out of a chair/moving?: No Do you have difficulty managing or taking your medications?: No  Follow up appointments reviewed: PCP Follow-up appointment confirmed?: Yes Date of PCP follow-up appointment?: 11/01/22 Follow-up Provider: Dr. Clent Ridges Specialist [facility] Follow-up appointment confirmed?: Yes Date of Specialist follow-up appointment?: 11/20/22 Follow-Up Specialty Provider:: Dr. Levora Angel Do you need transportation to your follow-up appointment?: No Do you understand care options if your condition(s) worsen?: Yes-patient verbalized understanding  SDOH Interventions Today    Flowsheet Row Most Recent Value  SDOH Interventions   Food Insecurity Interventions Intervention Not Indicated  Transportation Interventions Intervention Not Indicated      TOC Interventions Today    Flowsheet Row Most Recent Value  TOC Interventions   TOC Interventions Discussed/Reviewed TOC Interventions Discussed, Arranged PCP follow up within 7 days/Care Guide scheduled      Interventions Today    Flowsheet Row Most Recent Value  General Interventions   General Interventions Discussed/Reviewed General Interventions Discussed, Doctor Visits  Doctor Visits Discussed/Reviewed Specialist, PCP, Doctor Visits Discussed  PCP/Specialist Visits Compliance with follow-up visit  Education Interventions   Education Provided Provided Education  Provided Verbal Education On Nutrition, When to see the doctor  Nutrition Interventions   Nutrition Discussed/Reviewed Nutrition Discussed, Fluid intake, Portion sizes, Supplemental nutrition, Increasing proteins, Decreasing fats, Decreasing salt  Pharmacy Interventions   Pharmacy Dicussed/Reviewed Pharmacy Topics Discussed, Medications and their functions  Safety Interventions   Safety Discussed/Reviewed Safety Discussed       Alessandra Grout [facility] Care Management Care Management Community Coordinator Direct Phone: [phone] Toll  Free: [phone] Fax: [phone]

## 2022-11-01 ENCOUNTER — Encounter: Payer: Self-pay | Admitting: Family Medicine

## 2022-11-01 ENCOUNTER — Ambulatory Visit (INDEPENDENT_AMBULATORY_CARE_PROVIDER_SITE_OTHER): Payer: Medicare HMO | Admitting: Family Medicine

## 2022-11-01 VITALS — BP 120/74 | HR 83 | Temp 98.4°F | Wt 230.0 lb

## 2022-11-01 DIAGNOSIS — K5720 Diverticulitis of large intestine with perforation and abscess without bleeding: Secondary | ICD-10-CM | POA: Diagnosis not present

## 2022-11-01 DIAGNOSIS — K5792 Diverticulitis of intestine, part unspecified, without perforation or abscess without bleeding: Secondary | ICD-10-CM

## 2022-11-01 DIAGNOSIS — A048 Other specified bacterial intestinal infections: Secondary | ICD-10-CM | POA: Diagnosis not present

## 2022-11-01 DIAGNOSIS — B3781 Candidal esophagitis: Secondary | ICD-10-CM

## 2022-11-01 NOTE — Progress Notes (Signed)
   Subjective:    Patient ID: [name], male    DOB: [birth year], 62 y.o.   MRN: [id number]  HPI Here to follow up a hospital stay from 10-22-22 to 10-25-22 for sigmoid diverticulitis. On 10-18-22 he had upper and lower endoscopies performed by Dr. Levora Angel of [facility]. He was found to have both Candidiasis and H pylori in the esophagus, so he was started on Amoxicillin, Clarithromycin, and Fluticasone. The colonoscopy was clear, but he was recommended to have a 5 year follow up. In the few days after these procedures he developed bloating and lower abdominal pains. No fever. He went to the ED, and a CT scan revealed sigmoid diverticulitis as well as 2 small abscesses. He was admitted for antitbiotic treatment. His WBC on admission was 12.3 and this went down to 9.9 by DC. His Hgb went from 12.8 to 13.6. Creatinine was stable at 0.72. He was given IV Zosyn, and then he was sent home on 10 days of Augmentin. It was agreed that he would finish the Augmentin first, and then start the Amoxicillin and Clarithromycin and Fluticasone after that. Since going home he has felt much better. He has had no more bloating or pain. He is advancing his diet to normal.    Review of Systems  Constitutional: Negative.   Respiratory: Negative.    Cardiovascular: Negative.   Gastrointestinal:  Positive for abdominal distention and abdominal pain. Negative for anal bleeding, blood in stool, constipation, diarrhea, nausea, rectal pain and vomiting.  Genitourinary: Negative.   Neurological: Negative.        Objective:   Physical Exam Constitutional:      Appearance: Normal appearance. He is not ill-appearing.  Cardiovascular:     Rate and Rhythm: Normal rate and regular rhythm.     Pulses: Normal pulses.     Heart sounds: Normal heart sounds.  Pulmonary:     Effort: Pulmonary effort is normal.     Breath sounds: Normal breath sounds.  Abdominal:     General: Abdomen is flat. Bowel sounds are normal. There is  no distension.     Palpations: Abdomen is soft. There is no mass.     Tenderness: There is no abdominal tenderness. There is no right CVA tenderness, left CVA tenderness, guarding or rebound.     Hernia: No hernia is present.  Neurological:     Mental Status: He is alert.           Assessment & Plan:  He is recovering well from diverticulitis with 2 small abscesses. He will advance his diet as tolerated. He will finish up all the medications described above. He will follow up with Dr. Levora Angel on 11-22-22. We spent a total of ( 35  ) minutes reviewing records and discussing these issues.  Gershon Crane, MD

## 2022-11-12 ENCOUNTER — Other Ambulatory Visit: Payer: Self-pay | Admitting: Family Medicine

## 2022-11-14 NOTE — Telephone Encounter (Signed)
Pt LOV was on 11/01/22 Pt last refill was done on 08/07/22 Please advise

## 2022-11-20 ENCOUNTER — Other Ambulatory Visit: Payer: Self-pay | Admitting: Family Medicine

## 2022-11-20 DIAGNOSIS — I10 Essential (primary) hypertension: Secondary | ICD-10-CM

## 2022-11-21 NOTE — Telephone Encounter (Signed)
Pt LOV was on 10/12/22 Pt last refill on Testosterone was done on 07/05/22 Please advise

## 2022-11-22 ENCOUNTER — Telehealth: Payer: Self-pay | Admitting: Family Medicine

## 2022-11-22 DIAGNOSIS — K5720 Diverticulitis of large intestine with perforation and abscess without bleeding: Secondary | ICD-10-CM | POA: Diagnosis not present

## 2022-11-22 DIAGNOSIS — R945 Abnormal results of liver function studies: Secondary | ICD-10-CM | POA: Diagnosis not present

## 2022-11-22 DIAGNOSIS — D649 Anemia, unspecified: Secondary | ICD-10-CM | POA: Diagnosis not present

## 2022-11-22 DIAGNOSIS — A048 Other specified bacterial intestinal infections: Secondary | ICD-10-CM | POA: Diagnosis not present

## 2022-11-22 NOTE — Telephone Encounter (Signed)
Pt requests testosterone cypionate (DEPOTESTOSTERONE CYPIONATE) 200 MG/ML injection  be sent to  [id number] - [place], [place] - [address] Phone: [phone]  Fax: [phone]

## 2022-11-22 NOTE — Telephone Encounter (Signed)
Already done

## 2022-11-23 NOTE — Telephone Encounter (Signed)
Pt calling says the pharmacy still does not have the script

## 2022-11-23 NOTE — Telephone Encounter (Signed)
Pt asked could he come pick up printed script. Was in the area today but advised provider was [place]. Requests that this be handled asap as he is going to be past due for his next shot.

## 2022-11-26 ENCOUNTER — Telehealth: Payer: Self-pay

## 2022-11-26 MED ORDER — TESTOSTERONE CYPIONATE 200 MG/ML IM SOLN: 200.0000 mg | INTRAMUSCULAR | 5 refills | Status: DC

## 2022-11-26 NOTE — Telephone Encounter (Signed)
Pt message sent  to Dr Clent Ridges

## 2022-11-26 NOTE — Telephone Encounter (Signed)
Pt message sent to PCP for approval

## 2022-11-26 NOTE — Telephone Encounter (Signed)
Pt is calling checking on the status of  testosterone    Signed     Pt calling says the pharmacy still does not have the script

## 2022-11-26 NOTE — Telephone Encounter (Signed)
Pt was notified.  

## 2022-11-26 NOTE — Telephone Encounter (Signed)
I just sent this over

## 2022-11-26 NOTE — Telephone Encounter (Signed)
Done

## 2022-12-21 ENCOUNTER — Ambulatory Visit (INDEPENDENT_AMBULATORY_CARE_PROVIDER_SITE_OTHER): Payer: Medicare HMO | Admitting: Family Medicine

## 2022-12-21 ENCOUNTER — Encounter: Payer: Self-pay | Admitting: Family Medicine

## 2022-12-21 VITALS — BP 110/68 | HR 79 | Temp 98.4°F | Wt 233.0 lb

## 2022-12-21 DIAGNOSIS — I10 Essential (primary) hypertension: Secondary | ICD-10-CM

## 2022-12-21 DIAGNOSIS — R42 Dizziness and giddiness: Secondary | ICD-10-CM

## 2022-12-21 DIAGNOSIS — G4700 Insomnia, unspecified: Secondary | ICD-10-CM | POA: Insufficient documentation

## 2022-12-21 MED ORDER — TRAZODONE HCL 50 MG PO TABS: 50.0000 mg | ORAL_TABLET | Freq: Every day | ORAL | 3 refills | Status: DC

## 2022-12-21 MED ORDER — TELMISARTAN 20 MG PO TABS
20.0000 mg | ORAL_TABLET | Freq: Two times a day (BID) | ORAL | 3 refills | Status: DC
Start: 2022-12-21 — End: 2023-04-03

## 2022-12-21 NOTE — Progress Notes (Signed)
Subjective:    Patient ID: [name], male    DOB: 11-27-1960, 62 y.o.   MRN: [id number]  HPI Here for 3 issues. First he is concerned about his BP. Over the past few weeks he has consistently been getting systolic readings of 140's and 150's. No changes in diet. He still works out regularly. Second, he feels fine except he has had some spells of lightheadedness recently which he has never felt before. No headaches or SOB. Third, he has trouble sleeping. He usually sleeps only an hour at a time, then he wakes up and tries to get back to sleep. He denies any anxiety.    Review of Systems  Constitutional: Negative.   Respiratory: Negative.    Cardiovascular: Negative.   Neurological:  Positive for light-headedness. Negative for dizziness and headaches.  Psychiatric/Behavioral:  Positive for sleep disturbance. Negative for dysphoric mood. The patient is not nervous/anxious.        Objective:   Physical Exam Constitutional:      Appearance: Normal appearance.  Neck:     Vascular: No carotid bruit.  Cardiovascular:     Rate and Rhythm: Normal rate and regular rhythm.     Pulses: Normal pulses.     Heart sounds: Normal heart sounds.  Pulmonary:     Effort: Pulmonary effort is normal.     Breath sounds: Normal breath sounds.  Neurological:     Mental Status: He is alert.           Assessment & Plan:  His HTN has gotten a bit worse, so we will increase the Telmisartan to 20 mg BID. The lighthheadedness could be a result of the high BP but we will set up a carotid US to investigate. For insomnia, he will try Trazodone 50 mg at bedtime.  Gershon Crane, MD

## 2022-12-25 ENCOUNTER — Telehealth: Payer: Self-pay | Admitting: Family Medicine

## 2022-12-25 NOTE — Telephone Encounter (Signed)
Pt is calling and need new rx for telmisartan (MICARDIS) 20 MG tablet twice a day instead of one a day  [id number] - [place], [place] - [address] Phone: [phone]  Fax: [phone]

## 2022-12-26 ENCOUNTER — Other Ambulatory Visit: Payer: Self-pay

## 2022-12-26 DIAGNOSIS — D2121 Benign neoplasm of connective and other soft tissue of right lower limb, including hip: Secondary | ICD-10-CM | POA: Diagnosis not present

## 2022-12-26 NOTE — Telephone Encounter (Signed)
FYI Pt states that the new Rx for Trazodone is giving him headaches and drowsiness in the mornings and not helping at night. Pt wanted to let Dr Clent Ridges know that he is nolonger taking medication.

## 2022-12-26 NOTE — Telephone Encounter (Signed)
Call in Telmisartan 20 mg BID, #180 with 3 rf

## 2022-12-26 NOTE — Telephone Encounter (Signed)
Spoke with pt pharmacy state that pt insurance will not cover for Telmisartan 20 BID but will cover Telmisartan 40 to take 1/2 tab in AM and 1/2 tab at PM. Pt was notified

## 2022-12-27 LAB — SURGICAL PATHOLOGY

## 2022-12-27 NOTE — Telephone Encounter (Signed)
Noted. I took this off his med list

## 2022-12-28 NOTE — Telephone Encounter (Signed)
Noted  

## 2023-01-02 ENCOUNTER — Ambulatory Visit (HOSPITAL_COMMUNITY)
Admission: RE | Admit: 2023-01-02 | Discharge: 2023-01-02 | Disposition: A | Discharge status: Home / Self Care | Payer: Medicare HMO | Source: Ambulatory Visit | Attending: Family Medicine | Admitting: Family Medicine

## 2023-01-02 DIAGNOSIS — R42 Dizziness and giddiness: Secondary | ICD-10-CM | POA: Insufficient documentation

## 2023-01-05 ENCOUNTER — Other Ambulatory Visit: Payer: Self-pay | Admitting: Family Medicine

## 2023-01-08 DIAGNOSIS — M25562 Pain in left knee: Secondary | ICD-10-CM | POA: Diagnosis not present

## 2023-01-08 DIAGNOSIS — M25561 Pain in right knee: Secondary | ICD-10-CM | POA: Diagnosis not present

## 2023-01-15 ENCOUNTER — Observation Stay (HOSPITAL_BASED_OUTPATIENT_CLINIC_OR_DEPARTMENT_OTHER)
Admission: EM | Admit: 2023-01-15 | Discharge: 2023-01-16 | Disposition: A | Discharge status: Home / Self Care | Payer: Medicare HMO | Attending: Internal Medicine | Admitting: Internal Medicine

## 2023-01-15 ENCOUNTER — Encounter (HOSPITAL_BASED_OUTPATIENT_CLINIC_OR_DEPARTMENT_OTHER): Payer: Self-pay | Admitting: Emergency Medicine

## 2023-01-15 ENCOUNTER — Emergency Department (HOSPITAL_BASED_OUTPATIENT_CLINIC_OR_DEPARTMENT_OTHER): Payer: Medicare HMO | Admitting: Radiology

## 2023-01-15 ENCOUNTER — Other Ambulatory Visit: Payer: Self-pay

## 2023-01-15 ENCOUNTER — Emergency Department (HOSPITAL_BASED_OUTPATIENT_CLINIC_OR_DEPARTMENT_OTHER): Payer: Medicare HMO

## 2023-01-15 DIAGNOSIS — R7989 Other specified abnormal findings of blood chemistry: Secondary | ICD-10-CM | POA: Insufficient documentation

## 2023-01-15 DIAGNOSIS — Z8546 Personal history of malignant neoplasm of prostate: Secondary | ICD-10-CM | POA: Diagnosis not present

## 2023-01-15 DIAGNOSIS — R079 Chest pain, unspecified: Secondary | ICD-10-CM | POA: Diagnosis not present

## 2023-01-15 DIAGNOSIS — M1990 Unspecified osteoarthritis, unspecified site: Secondary | ICD-10-CM | POA: Insufficient documentation

## 2023-01-15 DIAGNOSIS — G4733 Obstructive sleep apnea (adult) (pediatric): Secondary | ICD-10-CM | POA: Diagnosis not present

## 2023-01-15 DIAGNOSIS — Z79899 Other long term (current) drug therapy: Secondary | ICD-10-CM | POA: Diagnosis not present

## 2023-01-15 DIAGNOSIS — R0789 Other chest pain: Principal | ICD-10-CM | POA: Insufficient documentation

## 2023-01-15 DIAGNOSIS — Z96643 Presence of artificial hip joint, bilateral: Secondary | ICD-10-CM | POA: Diagnosis not present

## 2023-01-15 DIAGNOSIS — I4891 Unspecified atrial fibrillation: Secondary | ICD-10-CM | POA: Diagnosis not present

## 2023-01-15 DIAGNOSIS — D509 Iron deficiency anemia, unspecified: Secondary | ICD-10-CM | POA: Insufficient documentation

## 2023-01-15 DIAGNOSIS — I10 Essential (primary) hypertension: Secondary | ICD-10-CM | POA: Diagnosis not present

## 2023-01-15 DIAGNOSIS — I480 Paroxysmal atrial fibrillation: Secondary | ICD-10-CM | POA: Insufficient documentation

## 2023-01-15 DIAGNOSIS — I517 Cardiomegaly: Secondary | ICD-10-CM | POA: Diagnosis not present

## 2023-01-15 DIAGNOSIS — Z950 Presence of cardiac pacemaker: Secondary | ICD-10-CM | POA: Diagnosis not present

## 2023-01-15 LAB — CBC
HCT: 47.2 % (ref 39.0–52.0)
Hemoglobin: 14.6 g/dL (ref 13.0–17.0)
MCH: 22.3 pg — ABNORMAL LOW (ref 26.0–34.0)
MCHC: 30.9 g/dL (ref 30.0–36.0)
MCV: 72.1 fL — ABNORMAL LOW (ref 80.0–100.0)
Platelets: 321 10*3/uL (ref 150–400)
RBC: 6.55 MIL/uL — ABNORMAL HIGH (ref 4.22–5.81)
RDW: 22.5 % — ABNORMAL HIGH (ref 11.5–15.5)
WBC: 5.2 10*3/uL (ref 4.0–10.5)
nRBC: 0 % (ref 0.0–0.2)

## 2023-01-15 LAB — BASIC METABOLIC PANEL
Anion gap: 9 (ref 5–15)
BUN: 14 mg/dL (ref 6–20)
CO2: 28 mmol/L (ref 22–32)
Calcium: 9.8 mg/dL (ref 8.9–10.3)
Chloride: 100 mmol/L (ref 98–111)
Creatinine, Ser: 0.86 mg/dL (ref 0.61–1.24)
GFR, Estimated: >60 mL/min (ref >60)
Glucose, Bld: 114 mg/dL — ABNORMAL HIGH (ref 70–99)
Potassium: 4.7 mmol/L (ref 3.5–5.1)
Sodium: 137 mmol/L (ref 135–145)

## 2023-01-15 LAB — TROPONIN I (HIGH SENSITIVITY)
Troponin I (High Sensitivity): 10 ng/L (ref <18)
Troponin I (High Sensitivity): 19 ng/L — ABNORMAL HIGH (ref <18)

## 2023-01-15 LAB — TSH: TSH: 0.999 u[IU]/mL (ref 0.350–4.500)

## 2023-01-15 MED ORDER — METOPROLOL TARTRATE 50 MG PO TABS
50.0000 mg | ORAL_TABLET | Freq: Two times a day (BID) | ORAL | Status: DC
  Administered 2023-01-15: 50 mg via ORAL
  Filled 2023-01-15: qty 1

## 2023-01-15 MED ORDER — HYDRALAZINE HCL 20 MG/ML IJ SOLN: 10.0000 mg | Freq: Four times a day (QID) | INTRAMUSCULAR | Status: DC | PRN

## 2023-01-15 MED ORDER — APIXABAN 5 MG PO TABS
5.0000 mg | ORAL_TABLET | Freq: Two times a day (BID) | ORAL | Status: DC
  Administered 2023-01-15 – 2023-01-16 (×2): 5 mg via ORAL
  Filled 2023-01-15 (×2): qty 1

## 2023-01-15 MED ORDER — ENOXAPARIN SODIUM 40 MG/0.4ML IJ SOSY
40.0000 mg | PREFILLED_SYRINGE | INTRAMUSCULAR | Status: DC
  Administered 2023-01-15: 40 mg via SUBCUTANEOUS
  Filled 2023-01-15: qty 0.4

## 2023-01-15 MED ORDER — DILTIAZEM HCL-DEXTROSE 125-5 MG/125ML-% IV SOLN (PREMIX)
5.0000 mg/h | INTRAVENOUS | Status: DC
  Administered 2023-01-15: 5 mg/h via INTRAVENOUS
  Administered 2023-01-15: 15 mg/h via INTRAVENOUS
  Filled 2023-01-15 (×2): qty 125

## 2023-01-15 MED ORDER — ACETAMINOPHEN 500 MG PO TABS: 500.0000 mg | ORAL_TABLET | Freq: Four times a day (QID) | ORAL | Status: DC | PRN

## 2023-01-15 MED ORDER — IRBESARTAN 75 MG PO TABS
75.0000 mg | ORAL_TABLET | Freq: Every day | ORAL | Status: DC
  Administered 2023-01-16: 75 mg via ORAL
  Filled 2023-01-15: qty 1

## 2023-01-15 MED ORDER — DILTIAZEM LOAD VIA INFUSION
20.0000 mg | Freq: Once | INTRAVENOUS | Status: AC
  Administered 2023-01-15: 20 mg via INTRAVENOUS
  Filled 2023-01-15: qty 20

## 2023-01-15 MED ORDER — TRAMADOL HCL 50 MG PO TABS: 50.0000 mg | ORAL_TABLET | Freq: Four times a day (QID) | ORAL | Status: DC | PRN

## 2023-01-15 MED ORDER — ALBUTEROL SULFATE (2.5 MG/3ML) 0.083% IN NEBU
2.5000 mg | INHALATION_SOLUTION | Freq: Four times a day (QID) | RESPIRATORY_TRACT | Status: DC | PRN
Start: 2023-01-15 — End: 2023-01-16

## 2023-01-15 NOTE — H&P (Signed)
[facility] History and Physical  [name]:[id number] DOB: Feb 03, 1961 DOA: 01/15/2023 PCP: Nelwyn Salisbury, MD  Presented from: Home Chief Complaint: Chest pressure  History of Present Illness: [name] is a 62 y.o. male with PMH significant for A-fib s/p cardioversion, used to see cardiologist Dr. Eden Emms, not on blood thinner, h/o prostate cancer on testosterone, arthritis, iron deficiency anemia, diverticulosis, depression. Patient presented to the ED today with complaint of sudden onset of chest pressure  that started about 30 minutes prior to presentation while leaving the gym.  Also reports associated lightheadedness, dizziness, diaphoresis and palpitation. He had not been feeling good for a few weeks.  He had noticed his heart rate go as high as 150s in the night.  In the ED, his initial heart rate was in 150s,, blood pressure in 120s, breathing on room air Initial labs with CBC/BMP unremarkable Troponin normal at 10. EKG with A-fib with RVR rate 147 bpm, QTc 470 ms, no ST-T wave changes  Patient was started on Cardizem drip At the request of ED physician, I accepted the patient for cardiac telemetry earlier in the day. For last few hours in the ED on Cardizem drip, his heart rate has been fluctuating.  Currently he has maxed out on Cardizem drip at 15 and is still tachycardic to 150s.  Blood pressure remains in 120s  At the time of my evaluation this afternoon, patient is lying down in bed.  Not in distress but tachycardic to 130s. Not short of breath. Of note, patient was hospitalized in August 2024 for acute diverticulitis with abscess.  He reports he followed up with GI as an outpatient.  His ferritin level was low.  Underwent endoscopy and colonoscopy.  Colonoscopy showed diverticulosis.  He was positive for H. pylori and completed triple therapy regimen.   Review of Systems:  All systems were reviewed and were negative unless otherwise mentioned in the  HPI   Past medical history: Past Medical History:  Diagnosis Date   Arthritis    Cancer (HCC)    prostate, sees Dr. Isabel Caprice    Colon polyps    Degenerative disc disease, cervical    Degenerative disc disease, lumbar    Depression    GERD (gastroesophageal reflux disease)    Low back pain    Osteopenia     Past surgical history: Past Surgical History:  Procedure Laterality Date   CERVICAL FUSION  2015   C3 through C7, per Dr. Wyline Mood    COLONOSCOPY  10/18/2022   per Dr. Levora Angel, clear, repeat in 5 yrs (has a hx of adenomatous polyps)   CYSTECTOMY     benign, right hip   ESOPHAGOGASTRODUODENOSCOPY  10/18/2022   per Dr. Levora Angel, showed H pylori and Candida   GYNECOMASTIA EXCISION     bilateral, reduction   HIP SURGERY Right    12/2020   JOINT REPLACEMENT  12/20/2011   hx. LTHA, now RTHA planned   LAMINECTOMY  04/2007   lumbar   LUMBAR FUSION  2015   L4 through S1, per Dr. Wyline Mood    LUMBAR MICRODISCECTOMY  10/15/2008   L-4-5, per Dr. Donette Larry at [facility]   PROSTATE SURGERY  12/13/2010   robotic prostatectomy per Dr. Isabel Caprice    TOTAL HIP ARTHROPLASTY  12/20/2011   2010 left hip   TOTAL HIP ARTHROPLASTY  12/26/2011   Procedure: TOTAL HIP ARTHROPLASTY;  Surgeon: Loanne Drilling, MD;  Location: [facility];  Service: Orthopedics;  Laterality: Right;  Social History:  reports that he has never smoked. He has never used smokeless tobacco. He reports that he does not drink alcohol and does not use drugs.  Allergies:  Allergies  Allergen Reactions   Flexeril [Cyclobenzaprine]     Weird feeling   Oxycodone-Acetaminophen Nausea Only    dizziness   Oxycodone-Acetaminophen Nausea Only and Other (See Comments)    dizziness Weird feeling;  Hydrocodone on home med list 7/24 dizziness   Flexeril [cyclobenzaprine], Oxycodone-acetaminophen, and Oxycodone-acetaminophen   Family history:  Family History  Problem Relation Age of Onset   Hypertension  Father    Suicidality Father    Healthy Mother    Arthritis Other    Cancer Brother        Bone cancer     Physical Exam: Vitals:   01/15/23 1504 01/15/23 1505 01/15/23 1515 01/15/23 1654  BP:   124/64 132/80  Pulse: 91 89 (!) 132   Resp: 20 20 (!) 21 16  Temp:    98.4 F (36.9 C)  TempSrc:    Axillary  SpO2: 98% 99% 100%   Weight:      Height:       Wt Readings from Last 3 Encounters:  01/15/23 102.1 kg  12/21/22 105.7 kg  11/01/22 104.3 kg   Body mass index is 32.28 kg/m.  General exam: Pleasant, middle-aged Caucasian male.  Not in pain Skin: No rashes, lesions or ulcers. HEENT: Atraumatic, normocephalic, no obvious bleeding Lungs: Clear to auscultation bilaterally CVS: Irregular rhythm, tachycardic, no murmur GI/Abd: soft, nontender, nondistended, bowel sound present CNS: Alert, awake, oriented x 3 Psychiatry: Mood appropriate Extremities: No pedal edema, no calf tenderness   ------------------------------------------------------------------------------------------------------ Assessment/Plan: Principal Problem:   Atrial fibrillation with RVR (HCC)  A-fib with RVR H/o A-fib s/p cardioversion Presented with sudden onset chest pressure this morning.  Also reports not good for a week.  Unclear time of onset PTA not on AV nodal blocking agent or blood thinners. Started on Cardizem drip in the ED.  He has maxed out on it but still remains tachycardic.  May need amiodarone drip Cardiology consulted CHADSVASC score 1 (HTN) -not on blood thinners.  Echocardiogram may affect a score.  Ordered. Potassium level normal.  Obtain magnesium level as well Recent Labs  Lab 01/15/23 1125  K 4.7   Elevated troponin Mildly elevated second troponin, likely demand ischemia due to tachycardia.   No prior history of CAD Echo to rule out wall motion abnormality Recent Labs    01/15/23 1125 01/15/23 1313  TROPONINIHS 10 19*   h/o prostate cancer  on testosterone shots  weekly  Arthritis Tramadol as needed Would avoid NSAIDs given history of iron-deficiency anemia  Microcytosis H/o iron deficiency Hemoglobin level normal but using testosterone. Will repeat ferritin level to see progression.  Patient seems to be on Voltaren 75 mg twice daily regularly.  Avoid NSAIDs. Recent Labs    02/19/22 0908 08/24/22 0911 10/22/22 0952 10/23/22 0443 10/24/22 0641 10/25/22 0901 01/15/23 1125  HGB 13.5  --  12.8* 12.5* 12.6* 13.6 14.6  MCV 67.2 Repeated and verified X2.*  --  66.7* 68.1* 67.7* 67.7* 72.1*  VITAMINB12 957*  --   --   --   --   --   --   FERRITIN 8.6* 11.7*  --   --   --   --   --   TIBC 522.2* 509.6*  --   --   --   --   --  IRON 29* 33*  --   --   --   --   --     Mobility: Encourage ambulation  Goals of care   Code Status: Full Code    DVT prophylaxis:  enoxaparin (LOVENOX) injection 40 mg Start: 01/15/23 1800   Antimicrobials: None Fluid: None Consultants: Cardiology Family Communication: None at bedside  Dispo: The patient is from: Home              Anticipated d/c is to: Home in 1 to 2 days hopefully  Diet: Diet Order             Diet NPO time specified  Diet effective midnight           Diet Heart Room service appropriate? Yes; Fluid consistency: Thin  Diet effective now                    ------------------------------------------------------------------------------------- Severity of Illness: The appropriate patient status for this patient is OBSERVATION. Observation status is judged to be reasonable and necessary in order to provide the required intensity of service to ensure the patient's safety. The patient's presenting symptoms, physical exam findings, and initial radiographic and laboratory data in the context of their medical condition is felt to place them at decreased risk for further clinical deterioration. Furthermore, it is anticipated that the patient will be medically stable for discharge from the  hospital within 2 midnights of admission.  -------------------------------------------------------------------------------------  Home Meds: Prior to Admission medications   Medication Sig Start Date End Date Taking? Authorizing Provider  anastrozole (ARIMIDEX) 1 MG tablet TAKE 1 TABLET BY MOUTH EVERY DAY Patient taking differently: Take 1 mg by mouth once a week. Every Friday after testosterone 11/21/22  Yes Nelwyn Salisbury, MD  Cholecalciferol (VITAMIN D) 125 MCG (5000 UT) CAPS Take 5,000 Units by mouth daily.   Yes [provider]  diclofenac (VOLTAREN) 75 MG EC tablet TAKE 1 TABLET TWICE DAILY 01/08/23  Yes Nelwyn Salisbury, MD  Iron, Ferrous Sulfate, 325 (65 Fe) MG TABS Take 325 mg by mouth 2 (two) times daily. 08/28/22  Yes Nelwyn Salisbury, MD  telmisartan (MICARDIS) 20 MG tablet Take 1 tablet (20 mg total) by mouth in the morning and at bedtime. 12/21/22  Yes Nelwyn Salisbury, MD  testosterone cypionate (DEPOTESTOSTERONE CYPIONATE) 200 MG/ML injection Inject 1 mL (200 mg total) into the muscle every 7 (seven) days. 11/26/22  Yes Nelwyn Salisbury, MD  traMADol (ULTRAM) 50 MG tablet TAKE 2 TABLETS EVERY 6 HOURS AS NEEDED FOR PAIN 11/14/22  Yes Nelwyn Salisbury, MD    Labs on Admission:   CBC: Recent Labs  Lab 01/15/23 1125  WBC 5.2  HGB 14.6  HCT 47.2  MCV 72.1*  PLT 321    Basic Metabolic Panel: Recent Labs  Lab 01/15/23 1125  NA 137  K 4.7  CL 100  CO2 28  GLUCOSE 114*  BUN 14  CREATININE 0.86  CALCIUM 9.8    Liver Function Tests: No results for input(s): "AST", "ALT", "ALKPHOS", "BILITOT", "PROT", "ALBUMIN" in the last 168 hours. No results for input(s): "LIPASE", "AMYLASE" in the last 168 hours. No results for input(s): "AMMONIA" in the last 168 hours.  Cardiac Enzymes: No results for input(s): "CKTOTAL", "CKMB", "CKMBINDEX", "TROPONINI" in the last 168 hours.  BNP (last 3 results) No results for input(s): "BNP" in the last 8760 hours.  ProBNP (last 3  results) No results for input(s): "PROBNP" in the last 8760  hours.  CBG: No results for input(s): "GLUCAP" in the last 168 hours.  Lipase     Component Value Date/Time   LIPASE 16 10/22/2022 0952     Urinalysis    Component Value Date/Time   COLORURINE YELLOW 10/22/2022 0952   APPEARANCEUR CLEAR 10/22/2022 0952   LABSPEC 1.021 10/22/2022 0952   PHURINE 6.5 10/22/2022 0952   GLUCOSEU NEGATIVE 10/22/2022 0952   HGBUR SMALL (A) 10/22/2022 0952   HGBUR negative 01/12/2009 0759   BILIRUBINUR NEGATIVE 10/22/2022 0952   BILIRUBINUR n 12/15/2014 1535   KETONESUR NEGATIVE 10/22/2022 0952   PROTEINUR 30 (A) 10/22/2022 0952   UROBILINOGEN 0.2 12/17/2014 1227   NITRITE NEGATIVE 10/22/2022 0952   LEUKOCYTESUR NEGATIVE 10/22/2022 0952     Drugs of Abuse     Component Value Date/Time   LABOPIA NONE DETECTED 09/08/2019 1609   COCAINSCRNUR NONE DETECTED 09/08/2019 1609   LABBENZ NONE DETECTED 09/08/2019 1609   AMPHETMU NONE DETECTED 09/08/2019 1609   THCU NONE DETECTED 09/08/2019 1609   LABBARB NONE DETECTED 09/08/2019 1609      Radiological Exams on Admission: DG Chest Portable 1 View  Result Date: 01/15/2023 CLINICAL DATA:  Chest pain EXAM: PORTABLE CHEST 1 VIEW COMPARISON:  01/17/2022 FINDINGS: External pacer/defibrillator. Cervical spine fixation. Apical lordotic positioning. Midline trachea. Mild cardiomegaly. No pleural effusion or pneumothorax. No congestive failure. Clear lungs. IMPRESSION: Cardiomegaly, without acute disease. Electronically Signed   By: Jeronimo Greaves M.D.   On: 01/15/2023 15:55     Signed, Lorin Glass, MD [facility] 01/15/2023

## 2023-01-15 NOTE — Progress Notes (Signed)
Call received from -ED attending at [facility] Request is for  -inpatient admission and management   Information received from ED attending as below:   [name] is a 62 y.o. male with PMH significant for A-fib s/p cardioversion currently not on blood thinner, prostate cancer, arthritis, GERD, depression. Patient presented to the ED today with complaint of chest pressure that started about 30 minutes prior to presentation while leaving the gym. Had not been feeling good for a week.  HR 140-170. Cardizem drip started. HR 120s. Sees [facility] cardiology as an outpatient  Place in observation to cardiac telemetry. Cardiology service to be called once patient arrives May qualify for TEE cardioversion

## 2023-01-15 NOTE — Plan of Care (Signed)

## 2023-01-15 NOTE — ED Triage Notes (Signed)
Pt arrived POV, caox4, ambulatory in triage c/o chest pressure that started approx 30 min ago while driving after leaving the gym. Pt reports PMH afib, stating he had it twice, one cardioversion, not currently on blood thinners. Pt also c/o feeling lightheaded, dizzy, sweaty, and palpitations upon onset.

## 2023-01-15 NOTE — ED Provider Notes (Signed)
[place] EMERGENCY DEPARTMENT AT [facility] Provider Note   CSN: [id number] Arrival date & time: 01/15/23  1116     History {Add pertinent medical, surgical, social history, OB history to HPI:1} Chief Complaint  Patient presents with   Chest Pain    CADYN [name] is a 62 y.o. male.  HPI    62 year old male comes in with chief complaint of chest discomfort. Patient has known history of paroxysmal A-fib, OSA, hypertension, hypogonadism -on testosterone.  Patient indicates that he started having chest discomfort earlier today when he left the gym.  His heart rate on exam was rapid, therefore he decided to come to the ER.  He has had previous history of A-fib on 2 separate occasions.  The last time he was in the ER, he was cardioverted and discharged.  Patient states that last few days he has been having some generalized malaise.  However he did not notice any palpitations.  He has checked his blood pressure regularly and noted that his BP was running slightly high and he was calling his PCP to see if they can adjust his losartan.  He also noted that his heart rate was running in the 80s and 90s on occasion which was uncommon.  Patient has no history of PE, DVT.  He has been on testosterone for a long time now.   Home Medications Prior to Admission medications   Medication Sig Start Date End Date Taking? Authorizing Provider  anastrozole (ARIMIDEX) 1 MG tablet TAKE 1 TABLET BY MOUTH EVERY DAY 11/21/22   Nelwyn Salisbury, MD  Cholecalciferol (VITAMIN D) 125 MCG (5000 UT) CAPS Take 5,000 Units by mouth daily.    [provider]  diclofenac (VOLTAREN) 75 MG EC tablet TAKE 1 TABLET TWICE DAILY 01/08/23   Nelwyn Salisbury, MD  Iron, Ferrous Sulfate, 325 (65 Fe) MG TABS Take 325 mg by mouth 2 (two) times daily. 08/28/22   Nelwyn Salisbury, MD  telmisartan (MICARDIS) 20 MG tablet Take 1 tablet (20 mg total) by mouth in the morning and at bedtime. 12/21/22   Nelwyn Salisbury, MD   telmisartan (MICARDIS) 40 MG tablet Take 40 mg by mouth daily.    [provider]  testosterone cypionate (DEPOTESTOSTERONE CYPIONATE) 200 MG/ML injection Inject 1 mL (200 mg total) into the muscle every 7 (seven) days. 11/26/22   Nelwyn Salisbury, MD  traMADol (ULTRAM) 50 MG tablet TAKE 2 TABLETS EVERY 6 HOURS AS NEEDED FOR PAIN 11/14/22   Nelwyn Salisbury, MD  traZODone (DESYREL) 50 MG tablet Take 50 mg by mouth at bedtime.    [provider]      Allergies    Flexeril [cyclobenzaprine], Oxycodone-acetaminophen, and Oxycodone-acetaminophen    Review of Systems   Review of Systems  All other systems reviewed and are negative.   Physical Exam Updated Vital Signs BP (!) 112/91   Pulse (!) 118   Temp 98.1 F (36.7 C) (Oral)   Resp 15   Ht 5\' 10"  (1.778 m)   Wt 102.1 kg   SpO2 97%   BMI 32.28 kg/m  Physical Exam Vitals and nursing note reviewed.  Constitutional:      Appearance: He is well-developed.  HENT:     Head: Atraumatic.  Cardiovascular:     Rate and Rhythm: Tachycardia present. Rhythm irregular.  Pulmonary:     Effort: Pulmonary effort is normal.     Breath sounds: Normal breath sounds.  Musculoskeletal:  Cervical back: Neck supple.     Right lower leg: No tenderness. No edema.     Left lower leg: No tenderness. No edema.  Skin:    General: Skin is warm.  Neurological:     Mental Status: He is alert and oriented to person, place, and time.     ED Results / Procedures / Treatments   Labs (all labs ordered are listed, but only abnormal results are displayed) Labs Reviewed  BASIC METABOLIC PANEL - Abnormal; Notable for the following components:      Result Value   Glucose, Bld 114 (*)    All other components within normal limits  CBC - Abnormal; Notable for the following components:   RBC 6.55 (*)    MCV 72.1 (*)    MCH 22.3 (*)    RDW 22.5 (*)    All other components within normal limits  TROPONIN I (HIGH SENSITIVITY)  TROPONIN I  (HIGH SENSITIVITY)    EKG EKG Interpretation Date/Time:  Tuesday January 15 2023 11:21:58 EST Ventricular Rate:  147 PR Interval:    QRS Duration:  105 QT Interval:  300 QTC Calculation: 470 R Axis:   -4  Text Interpretation: Atrial fibrillation Probable left ventricular hypertrophy RVR is acute Confirmed by Derwood Kaplan [phone] on 01/15/2023 1:14:58 PM  Radiology No results found.  Procedures .Critical Care  Performed by: Derwood Kaplan, MD Authorized by: Derwood Kaplan, MD   Critical care provider statement:    Critical care time (minutes):  41   Critical care was necessary to treat or prevent imminent or life-threatening deterioration of the following conditions:  Circulatory failure and cardiac failure   Critical care was time spent personally by me on the following activities:  Development of treatment plan with patient or surrogate, discussions with consultants, evaluation of patient's response to treatment, examination of patient, ordering and review of laboratory studies, ordering and review of radiographic studies, ordering and performing treatments and interventions, pulse oximetry, re-evaluation of patient's condition, review of old charts and obtaining history from patient or surrogate   {Document cardiac monitor, telemetry assessment procedure when appropriate:1}  Medications Ordered in ED Medications  diltiazem (CARDIZEM) 1 mg/mL load via infusion 20 mg (20 mg Intravenous Bolus from Bag 01/15/23 1309)    And  diltiazem (CARDIZEM) 125 mg in dextrose 5% 125 mL (1 mg/mL) infusion (5 mg/hr Intravenous New Bag/Given 01/15/23 1309)    ED Course/ Medical Decision Making/ A&P   {   Click here for ABCD2, HEART and other calculatorsREFRESH Note before signing :1}       CHA2DS2-VASc Score: 1                        Medical Decision Making Amount and/or Complexity of Data Reviewed Labs: ordered. Radiology: ordered.  Risk Prescription drug management.   This  patient presents to the ED with chief complaint(s) of palpitations, dizziness, nausea, sweats and chest pain with pertinent past medical history of paroxysmal A-fib, not on any anticoagulation, hypertension, hypogonadism on testosterone, OSA.The complaint involves an extensive differential diagnosis and also carries with it a high risk of complications and morbidity.   Patient noted to be in A-fib with RVR.  The differential diagnosis includes A-fib with RVR secondary to pulmonary embolism, dehydration, increased excitation, stimulant use.  The testosterone use does increase the risk of thrombotic events, but it appears that patient was not having any chest pain, shortness of breath in the last few days.  He has had some malaise, which could be because he has been going in and out of A-fib.  I have reviewed patient's records.  It appears that in 2021 he was admitted to the hospital for A-fib with RVR. In 2022 patient had come in with palpitation and was cardioverted, placed on Eliquis.  It is clear to me that patient became symptomatic with A-fib this morning.  However he indicates that he has been having some generalized malaise the last few days.  He was attributing that to the anxiety of the election, but I cannot rule out possibility of patient going in and out of A-fib -but in a rate controlled manner.  Patient would prefer cardioversion, however after discussing the risks versus benefit, he is more comfortable with rate control and admission.  We will start patient on diltiazem load and infusion.  We will get some basic labs.  He does not appear to be in acute CHF at this time.  Additional history obtained: Records reviewed previous admission documents and previous cardiology notes.  Independent labs interpretation:  The following labs were independently interpreted: Initial troponin, hemoglobin and metabolic profile is normal.  Independent visualization and interpretation of imaging: - I  independently visualized the following imaging with scope of interpretation limited to determining acute life threatening conditions related to emergency care: X-ray of the chest, which revealed no evidence of significant pulmonary edema.  Treatment and Reassessment: ***  Consultation: - Consulted or discussed management/test interpretation with external professional: ***  Consideration for admission or further workup:  Social Determinants of health:   Final Clinical Impression(s) / ED Diagnoses Final diagnoses:  Atrial fibrillation with RVR (HCC)    Rx / DC Orders ED Discharge Orders     None

## 2023-01-15 NOTE — Consult Note (Addendum)
Cardiology Consultation   Patient ID: [name] MRN: [id number]; DOB: 1960-12-02  Admit date: 01/15/2023 Date of Consult: 01/15/2023  PCP:  William Salisbury, MD   [facility] Providers Cardiologist:  Kristeen Miss, MD   {  Patient Profile:   [name] is a 62 y.o. male with a hx of prostate cancer, paroxysmal atrial fibrillation, hypertension, iron deficiency anemia, DDD, OSA does not tolerate CPAP, who is being seen 01/15/2023 for the evaluation of A-fib RVR at the request of Dr. Pola Corn.  History of Present Illness:   [name] previously has history of atrial fibrillation diagnosed in 2021 after presenting with URI symptoms.  He rated at that time on a diltiazem drip.  He had another incident and August 2022 and eventually underwent DCCV.  He was temporarily on Eliquis.  This has not been continued long-term due to a low CHA2DS2-VASc score of 1.  At his last office visit in 2022 he was encouraged to get better control of his OSA but does not tolerate the mask/nasal pillows.  It was reported that if he could get his OSA under control he likely would be a candidate for A-fib ablation.  He had previous normal LVEF in 2021.  Additionally patient was seen in August 2024 due to blood in the stools.  Evaluation reviewed acute diverticulitis with abscess and H. pylori, and he also had subsequent iron deficiency anemia with low ferritin levels.  He was followed outpatient for this and was set to have repeat ferritin levels drawn in December.  Otherwise no other concerns of bleeding since then.  Currently, patient being evaluated for A-fib RVR.  He has had rates as high as 150-170.  A couple days ago he did report some viral-like symptoms reporting fever, chills, cough however this only lasted for 1 day.  And then today he reports going to the gym today and walking back and feeling significant chest pressure with some lightheadedness, dizziness, diaphoresis.  [facility] was seen at the  [facility] and then eventually transferred to [facility] and started on Cardizem drip.  Rates appear to be improving and downtrending however he does have sporadic rates that do jump back in the 150s but sometimes reach 90-110 as a baseline.  He is on the max dose of Cardizem.  Currently and prior to admission patient does not have any significant symptoms of chest pain, shortness of breath, peripheral edema, dizziness, palpitations outside of this isolated event.  He does seem pretty sensitive to his atrial fibrillation and is generally aware of when he goes into it based off prior encounters.  Chest x-ray indicating cardiomegaly, no acute disease.  No electrolyte abnormalities.  Normal renal function.  Troponin 10-19.  RBC 6.55.  Hemoglobin 6.  Past Medical History:  Diagnosis Date   Arthritis    Cancer [facility])    prostate, sees Dr. Isabel Caprice    Colon polyps    Degenerative disc disease, cervical    Degenerative disc disease, lumbar    Depression    GERD (gastroesophageal reflux disease)    Low back pain    Osteopenia     Past Surgical History:  Procedure Laterality Date   CERVICAL FUSION  2015   C3 through C7, per Dr. Wyline Mood    COLONOSCOPY  10/18/2022   per Dr. Levora Angel, clear, repeat in 5 yrs (has a hx of adenomatous polyps)   CYSTECTOMY     benign, right hip   ESOPHAGOGASTRODUODENOSCOPY  10/18/2022  per Dr. Levora Angel, showed H pylori and Candida   GYNECOMASTIA EXCISION     bilateral, reduction   HIP SURGERY Right    12/2020   JOINT REPLACEMENT  12/20/2011   hx. LTHA, now RTHA planned   LAMINECTOMY  04/2007   lumbar   LUMBAR FUSION  2015   L4 through S1, per Dr. Wyline Mood    LUMBAR MICRODISCECTOMY  10/15/2008   L-4-5, per Dr. Donette Larry at [facility]   PROSTATE SURGERY  12/13/2010   robotic prostatectomy per Dr. Isabel Caprice    TOTAL HIP ARTHROPLASTY  12/20/2011   2010 left hip   TOTAL HIP ARTHROPLASTY  12/26/2011   Procedure: TOTAL  HIP ARTHROPLASTY;  Surgeon: Loanne Drilling, MD;  Location: [facility];  Service: Orthopedics;  Laterality: Right;    Inpatient Medications: Scheduled Meds:  enoxaparin (LOVENOX) injection  40 mg Subcutaneous Q24H   [START ON 01/16/2023] irbesartan  75 mg Oral Daily   Continuous Infusions:  diltiazem (CARDIZEM) infusion 15 mg/hr (01/15/23 1637)   PRN Meds: acetaminophen, albuterol, hydrALAZINE, traMADol  Allergies:    Allergies  Allergen Reactions   Flexeril [Cyclobenzaprine]     Weird feeling   Oxycodone-Acetaminophen Nausea Only    dizziness   Oxycodone-Acetaminophen Nausea Only and Other (See Comments)    dizziness Weird feeling;  Hydrocodone on home med list 7/24 dizziness    Social History:   Social History   Socioeconomic History   Marital status: Divorced    Spouse name: Not on file   Number of children: Not on file   Years of education: Not on file   Highest education level: 12th grade  Occupational History   Not on file  Tobacco Use   Smoking status: Never   Smokeless tobacco: Never  Vaping Use   Vaping status: Never Used  Substance and Sexual Activity   Alcohol use: No    Alcohol/week: 0.0 standard drinks of alcohol    Comment: rare   Drug use: No   Sexual activity: Yes  Other Topics Concern   Not on file  Social History Narrative   Lives alone in a one story home.  No children.     On disability since September 2016.  He was previously working in Consulting civil engineer.    Education: high school.   Social Determinants of Health   Financial Resource Strain: Medium Risk (12/20/2022)   Overall Financial Resource Strain (CARDIA)    Difficulty of Paying Living Expenses: Somewhat hard  Food Insecurity: No Food Insecurity (01/15/2023)   Hunger Vital Sign    Worried About Running Out of Food in the Last Year: Never true    Ran Out of Food in the Last Year: Never true  Transportation Needs: No Transportation Needs (01/15/2023)   PRAPARE - Scientist, research (physical sciences) (Medical): No    Lack of Transportation (Non-Medical): No  Physical Activity: Unknown (12/20/2022)   Exercise Vital Sign    Days of Exercise per Week: Patient declined    Minutes of Exercise per Session: 60 min  Stress: No Stress Concern Present (12/20/2022)   [employer] of Occupational Health - Occupational Stress Questionnaire    Feeling of Stress : Only a little  Social Connections: Socially Isolated (12/20/2022)   Social Connection and Isolation Panel [NHANES]    Frequency of Communication with Friends and Family: More than three times a week    Frequency of Social Gatherings with Friends and Family: More than three times a week  Attends Religious Services: Never    Active Member of Clubs or Organizations: No    Attends Banker Meetings: Never    Marital Status: Divorced  Catering manager Violence: Not At Risk (01/15/2023)   Humiliation, Afraid, Rape, and Kick questionnaire    Fear of Current or Ex-Partner: No    Emotionally Abused: No    Physically Abused: No    Sexually Abused: No    Family History:   Family History  Problem Relation Age of Onset   Hypertension Father    Suicidality Father    Healthy Mother    Arthritis Other    Cancer Brother        Bone cancer     ROS:  Please see the history of present illness.  All other ROS reviewed and negative.     Physical Exam/Data:   Vitals:   01/15/23 1504 01/15/23 1505 01/15/23 1515 01/15/23 1654  BP:   124/64 132/80  Pulse: 91 89 (!) 132   Resp: 20 20 (!) 21 16  Temp:    98.4 F (36.9 C)  TempSrc:    Axillary  SpO2: 98% 99% 100%   Weight:      Height:        Intake/Output Summary (Last 24 hours) at 01/15/2023 1737 Last data filed at 01/15/2023 1637 Gross per 24 hour  Intake 63.48 ml  Output --  Net 63.48 ml      01/15/2023   11:22 AM 12/21/2022    9:20 AM 11/01/2022   10:15 AM  Last 3 Weights  Weight (lbs) 225 lb 233 lb 230 lb  Weight (kg) 102.059 kg 105.688 kg  104.327 kg     Body mass index is 32.28 kg/m.  General:  Well nourished, well developed, in no acute distress HEENT: normal Neck: no JVD Vascular: No carotid bruits; Distal pulses 2+ bilaterally Cardiac: Irregularly irregular, tachycardic Lungs:  clear to auscultation bilaterally, no wheezing, rhonchi or rales  Abd: soft, nontender, no hepatomegaly  Ext: no edema Musculoskeletal:  No deformities, BUE and BLE strength normal and equal Skin: warm and dry  Neuro:  CNs 2-12 intact, no focal abnormalities noted Psych:  Normal affect   EKG:  The EKG was personally reviewed and demonstrates: Atrial fibrillation, heart rate 147.  Prominent T waves in lateral leads.  Otherwise no acute ST-T wave changes. Telemetry:  Telemetry was personally reviewed and demonstrates: Atrial fibrillation heart rate between 100-140  Relevant CV Studies: Echocardiogram 10/05/2019  1. Left ventricular ejection fraction, by estimation, is 55%. The left  ventricle has normal function. The left ventricle has no regional wall  motion abnormalities. There is mild left ventricular hypertrophy. Left  ventricular diastolic parameters are  consistent with Grade I diastolic dysfunction (impaired relaxation).   2. Right ventricular systolic function is normal. The right ventricular  size is normal. Tricuspid regurgitation signal is inadequate for assessing  PA pressure.   3. The mitral valve is normal in structure. No evidence of mitral valve  regurgitation. No evidence of mitral stenosis.   4. The aortic valve is tricuspid. Aortic valve regurgitation is not  visualized. No aortic stenosis is present.   5. Aortic dilatation noted. There is mild dilatation of the ascending  aorta measuring 39 mm.   6. The inferior vena cava is normal in size with greater than 50%  respiratory variability, suggesting right atrial pressure of 3 mmHg.   Laboratory Data:  High Sensitivity Troponin:   Recent Labs  Lab 01/15/23  1125  01/15/23 1313  TROPONINIHS 10 19*     Chemistry Recent Labs  Lab 01/15/23 1125  NA 137  K 4.7  CL 100  CO2 28  GLUCOSE 114*  BUN 14  CREATININE 0.86  CALCIUM 9.8  GFRNONAA >60  ANIONGAP 9    No results for input(s): "PROT", "ALBUMIN", "AST", "ALT", "ALKPHOS", "BILITOT" in the last 168 hours. Lipids No results for input(s): "CHOL", "TRIG", "HDL", "LABVLDL", "LDLCALC", "CHOLHDL" in the last 168 hours.  Hematology Recent Labs  Lab 01/15/23 1125  WBC 5.2  RBC 6.55*  HGB 14.6  HCT 47.2  MCV 72.1*  MCH 22.3*  MCHC 30.9  RDW 22.5*  PLT 321   Thyroid No results for input(s): "TSH", "FREET4" in the last 168 hours.  BNPNo results for input(s): "BNP", "PROBNP" in the last 168 hours.  DDimer No results for input(s): "DDIMER" in the last 168 hours.   Radiology/Studies:  DG Chest Portable 1 View  Result Date: 01/15/2023 CLINICAL DATA:  Chest pain EXAM: PORTABLE CHEST 1 VIEW COMPARISON:  01/17/2022 FINDINGS: External pacer/defibrillator. Cervical spine fixation. Apical lordotic positioning. Midline trachea. Mild cardiomegaly. No pleural effusion or pneumothorax. No congestive failure. Clear lungs. IMPRESSION: Cardiomegaly, without acute disease. Electronically Signed   By: Jeronimo Greaves M.D.   On: 01/15/2023 15:55     Assessment and Plan:   Paroxysmal atrial fibrillation Very symptomatic.  This is his third episode since 2021.  Ist episode he converted off of diltiazem, second time converted after DCCV.  Has not been on anticoagulation given low CHA2DS2-VASc score of 1.  Has had uncontrolled rates generally between 90-150.  He has very high energy and often correlated his rates. When sitting sedentary and relaxed his rates are controlled between 90-110.  Additionally, rates seem to be trending down. Euvolemic.  Potentially would be an ablation candidate if he can get his OSA under control Continue IV diltiazem, will also add metoprolol 50mg  BID.  If rate control strategy does not  work we will plan for TEE DCCV after 3 doses of eliquis Will start Eliquis 5 mg twice daily in anticipation if we cannot rate control and he needs TEE/DCCV. Can repeat echocardiogram when in normal sinus rhythm. Had normal LA on previous echo.  Check TSH.  OSA Does not tolerate CPAP.  He needs referral for ENT to assist with this.  Elevated troponin Minimally elevated and likely due to tachycardia.  No chest pain. Likely demand ischemia.   Risk Assessment/Risk Scores:   CHA2DS2-VASc Score = 1  This indicates a 0.6% annual risk of stroke. The patient's score is based upon: CHF History: 0 HTN History: 1 Diabetes History: 0 Stroke History: 0 Vascular Disease History: 0 Age Score: 0 Gender Score: 0     For questions or updates, please contact [facility] Please consult [email or web address] for contact info under    Signed, Abagail Kitchens, PA-C  01/15/2023 5:37 PM   I have seen and examined the patient along with Abagail Kitchens, PA-C .  I have reviewed the chart, notes and new data.  I agree with PA/NP's note.  Key new complaints: feeling better with slower heart rate. No dyspnea or dizziness. Intolerant of CPAP. Dental device caused facial hemianesthesia. Beta blockers caused ED Key examination changes: AF w RVR, VR 110s, otw normal CV exam Key new findings / data: reviewed echo from 2022. LA was mildly dilated ESD 4.1 cm  PLAN: Good candidate for Inspire device for OSA and atrial fibrillation  ablation once that is addressed. Can try nebivolol rather than metoprolol for long term BP and ventricular rate control, hopefully will not cause same side effects. Start Eliquis tonight and if atrial fibrillation persists plan DCCV on Thursday (need 3 doses of anticoagulant on board).  Thurmon Fair, MD, [facility] [phone] 01/15/2023, 6:01 PM

## 2023-01-16 ENCOUNTER — Other Ambulatory Visit (HOSPITAL_COMMUNITY): Payer: Self-pay

## 2023-01-16 DIAGNOSIS — D509 Iron deficiency anemia, unspecified: Secondary | ICD-10-CM | POA: Diagnosis not present

## 2023-01-16 DIAGNOSIS — R7989 Other specified abnormal findings of blood chemistry: Secondary | ICD-10-CM | POA: Diagnosis not present

## 2023-01-16 DIAGNOSIS — I480 Paroxysmal atrial fibrillation: Secondary | ICD-10-CM | POA: Diagnosis not present

## 2023-01-16 DIAGNOSIS — I4891 Unspecified atrial fibrillation: Secondary | ICD-10-CM | POA: Diagnosis not present

## 2023-01-16 DIAGNOSIS — I10 Essential (primary) hypertension: Secondary | ICD-10-CM | POA: Diagnosis not present

## 2023-01-16 DIAGNOSIS — Z79899 Other long term (current) drug therapy: Secondary | ICD-10-CM | POA: Diagnosis not present

## 2023-01-16 DIAGNOSIS — Z96643 Presence of artificial hip joint, bilateral: Secondary | ICD-10-CM | POA: Diagnosis not present

## 2023-01-16 DIAGNOSIS — M1990 Unspecified osteoarthritis, unspecified site: Secondary | ICD-10-CM | POA: Diagnosis not present

## 2023-01-16 DIAGNOSIS — Z8546 Personal history of malignant neoplasm of prostate: Secondary | ICD-10-CM | POA: Diagnosis not present

## 2023-01-16 DIAGNOSIS — G4733 Obstructive sleep apnea (adult) (pediatric): Secondary | ICD-10-CM | POA: Diagnosis not present

## 2023-01-16 DIAGNOSIS — R0789 Other chest pain: Secondary | ICD-10-CM | POA: Diagnosis not present

## 2023-01-16 LAB — BASIC METABOLIC PANEL
Anion gap: 7 (ref 5–15)
BUN: 9 mg/dL (ref 6–20)
CO2: 26 mmol/L (ref 22–32)
Calcium: 9.1 mg/dL (ref 8.9–10.3)
Chloride: 106 mmol/L (ref 98–111)
Creatinine, Ser: 0.95 mg/dL (ref 0.61–1.24)
GFR, Estimated: >60 mL/min (ref >60)
Glucose, Bld: 108 mg/dL — ABNORMAL HIGH (ref 70–99)
Potassium: 3.7 mmol/L (ref 3.5–5.1)
Sodium: 139 mmol/L (ref 135–145)

## 2023-01-16 LAB — CBC
HCT: 46.1 % (ref 39.0–52.0)
Hemoglobin: 14.4 g/dL (ref 13.0–17.0)
MCH: 22.2 pg — ABNORMAL LOW (ref 26.0–34.0)
MCHC: 31.2 g/dL (ref 30.0–36.0)
MCV: 70.9 fL — ABNORMAL LOW (ref 80.0–100.0)
Platelets: 340 10*3/uL (ref 150–400)
RBC: 6.5 MIL/uL — ABNORMAL HIGH (ref 4.22–5.81)
RDW: 21.7 % — ABNORMAL HIGH (ref 11.5–15.5)
WBC: 7.7 10*3/uL (ref 4.0–10.5)
nRBC: 0 % (ref 0.0–0.2)

## 2023-01-16 LAB — FERRITIN: Ferritin: 12 ng/mL — ABNORMAL LOW (ref 24–336)

## 2023-01-16 LAB — MAGNESIUM: Magnesium: 2.1 mg/dL (ref 1.7–2.4)

## 2023-01-16 MED ORDER — METOPROLOL SUCCINATE ER 25 MG PO TB24
25.0000 mg | ORAL_TABLET | Freq: Every day | ORAL | Status: DC
Start: 2023-01-16 — End: 2023-01-16
  Administered 2023-01-16: 25 mg via ORAL
  Filled 2023-01-16: qty 1

## 2023-01-16 MED ORDER — METOPROLOL SUCCINATE ER 25 MG PO TB24: 25.0000 mg | ORAL_TABLET | Freq: Every day | ORAL | 0 refills | Status: DC

## 2023-01-16 MED ORDER — ACETAMINOPHEN 500 MG PO TABS: 1000.0000 mg | ORAL_TABLET | Freq: Three times a day (TID) | ORAL | 0 refills | Status: DC

## 2023-01-16 MED ORDER — APIXABAN 5 MG PO TABS: 5.0000 mg | ORAL_TABLET | Freq: Two times a day (BID) | ORAL | 0 refills | Status: DC

## 2023-01-16 NOTE — Plan of Care (Signed)

## 2023-01-16 NOTE — Progress Notes (Addendum)
Patient Name: [name] Date of Encounter: 01/16/2023 [facility] Cardiologist: Kristeen Miss, MD   Interval Summary  .    Converted back to normal sinus rhythm earlier this morning.  Maintaining heart rates in the 80s now.  Vital Signs .    Vitals:   01/15/23 1654 01/15/23 1800 01/15/23 2017 01/16/23 0503  BP: 132/80  108/74 (!) 145/80  Pulse:   97   Resp: 16  20 20   Temp: 98.4 F (36.9 C) 97.7 F (36.5 C) 98.5 F (36.9 C) 97.6 F (36.4 C)  TempSrc: Axillary Oral Oral Oral  SpO2:      Weight:    115.7 kg  Height:        Intake/Output Summary (Last 24 hours) at 01/16/2023 0848 Last data filed at 01/15/2023 1637 Gross per 24 hour  Intake 63.48 ml  Output --  Net 63.48 ml      01/16/2023    5:03 AM 01/15/2023   11:22 AM 12/21/2022    9:20 AM  Last 3 Weights  Weight (lbs) 255 lb 225 lb 233 lb  Weight (kg) 115.667 kg 102.059 kg 105.688 kg      Telemetry/ECG    Normal sinus rhythm heart rates in the 70s to 80s- Personally Reviewed  CV Studies    Echocardiogram 10/05/2019  1. Left ventricular ejection fraction, by estimation, is 55%. The left  ventricle has normal function. The left ventricle has no regional wall  motion abnormalities. There is mild left ventricular hypertrophy. Left  ventricular diastolic parameters are  consistent with Grade I diastolic dysfunction (impaired relaxation).   2. Right ventricular systolic function is normal. The right ventricular  size is normal. Tricuspid regurgitation signal is inadequate for assessing  PA pressure.   3. The mitral valve is normal in structure. No evidence of mitral valve  regurgitation. No evidence of mitral stenosis.   4. The aortic valve is tricuspid. Aortic valve regurgitation is not  visualized. No aortic stenosis is present.   5. Aortic dilatation noted. There is mild dilatation of the ascending  aorta measuring 39 mm.   6. The inferior vena cava is normal in size with greater than  50%  respiratory variability, suggesting right atrial pressure of 3 mmHg.   Physical Exam .   GEN: No acute distress.   Neck: No JVD Cardiac: RRR, no murmurs, rubs, or gallops.  Respiratory: Clear to auscultation bilaterally. GI: Soft, nontender, non-distended  MS: No edema  Patient Profile    [name] is a 62 y.o. male has hx of paroxysmal atrial fibrillation, prostate cancer, hypertension, iron deficiency anemia, DDD, OSA does not tolerate CPAP and admitted on 01/15/2023 for for the evaluation of A-fib RVR  Assessment & Plan .     Paroxysmal atrial fibrillation Very symptomatic.  This is his third episode since 2021.  Ist episode he converted off of diltiazem, second time converted after DCCV.  Has not been on anticoagulation given low CHA2DS2-VASc score of 1.  Potentially would be an ablation candidate if he can get his OSA under control  Converted back to normal sinus rhythm on IV diltiazem earlier this morning.  Feels significantly better.  We discussed need for OSA management and possible ablation.  He is willing to give it a better try.  Will transition his Toprol-XL to 25 mg daily Defer to MD whether to continue Eliquis.  Can repeat echocardiogram outpatient Normal TSH.   OSA Does not tolerate CPAP.  He needs  referral for ENT to assist with this.   Elevated troponin Minimally elevated and likely due to tachycardia.  No chest pain. Likely demand ischemia  Addendum: He has appointment scheduled with Dr. Lalla Brothers to discuss atrial fibrillation ablation.  Also has a referral to see Dr. Jenne Pane for consideration of inspire.  Referral has been sent to his office.  For questions or updates, please contact [facility] Please consult [email or web address] for contact info under        Signed, Abagail Kitchens, PA-C    I have seen and examined the patient along with Abagail Kitchens, PA-C  .  I have reviewed the chart, notes and new data.  I agree with PA/NP's note.  Key  new complaints: feels better Key examination changes: RRR Key new findings / data: echo pending but OK to repeat as OP  PLAN: [facility] will sign off.   Medication Recommendations:  Eliquis 5 mg twice daily for a month Other recommendations (labs, testing, etc):  echo as OP Follow up as an outpatient:  have scheduled f/u w EP to discuss ablation and referred to ENT to discuss Inspire device.   Thurmon Fair, MD, [facility] [phone] 01/16/2023, 10:58 AM

## 2023-01-16 NOTE — Discharge Summary (Signed)
Physician Discharge Summary  [name] ZOX:[id number] DOB: 09-Nov-1960 DOA: 01/15/2023  PCP: Nelwyn Salisbury, MD  Admit date: 01/15/2023 Discharge date: 01/16/2023  Admitted From: Home Discharge disposition: Home  Recommendations at discharge:  You been started on Toprol-XL 25 mg daily as well as Eliquis 5 mg twice daily for a month.  Watch out for any obvious bleeding, black stool or easy bruisability Follow-up with cardiology as an outpatient for possible ablation Stop Voltaren.  Switch to Tylenol scheduled for pain control Continue iron supplement Outpatient referral given for ENT to discuss Inspire device for sleep apnea.  Brief narrative: [name] is a 62 y.o. male with PMH significant for A-fib s/p cardioversion, used to see cardiologist Dr. Eden Emms, not on blood thinner, h/o prostate cancer on testosterone, arthritis, iron deficiency anemia, diverticulosis, depression. Patient presented to the ED today with complaint of sudden onset of chest pressure  that started about 30 minutes prior to presentation while leaving the gym.  Also reports associated lightheadedness, dizziness, diaphoresis and palpitation. He had not been feeling good for a few weeks.  He had noticed his heart rate go as high as 150s in the night.  In the ED, his initial heart rate was in 150s,, blood pressure in 120s, breathing on room air Initial labs with CBC/BMP unremarkable Troponin normal at 10. EKG with A-fib with RVR rate 147 bpm, QTc 470 ms, no ST-T wave changes  Patient was started on Cardizem drip Admitted to medicine under TR[facility] Seen by cardiology.  Of note, patient was hospitalized in August 2024 for acute diverticulitis with abscess.  He reports he followed up with GI as an outpatient.  His ferritin level was low.  Underwent endoscopy and colonoscopy.  Colonoscopy showed diverticulosis.  He was positive for H. pylori and completed triple therapy regimen.   See below for  details  Subjective: Patient was seen and examined this morning.  Pleasant middle-aged Caucasian male.  Sitting up at the edge of the bed.  Not in distress.  Cardioverted to normal sinus rhythm with Cardizem drip earlier this week. Ferritin level low again at 12.  Denies active bleeding.  But patient has been taking Voltaren tab twice daily for more than 15 years.  Counseled to switch to Tylenol as needed.  Hospital course: A-fib with RVR H/o A-fib s/p cardioversion Presented with sudden onset chest pressure this morning.  Also reports not good for a week.  Unclear time of onset. PTA not on AV nodal blocking agent or blood thinners. Started on Cardizem drip in the ED. with Cardizem drip and he converted to normal sinus rhythm overnight.  Cardiology consult appreciated.  He has been started on Toprol-XL 25 mg daily.  He has also been initiated on Eliquis 5 mg daily.  Patient will follow-up with EP Dr. Lalla Brothers as an outpatient to discuss ablation of atrial fibrillation. Recent Labs  Lab 01/15/23 1125 01/16/23 0416  K 4.7 3.7  MG  --  2.1   Elevated troponin Mildly elevated second troponin, likely demand ischemia due to tachycardia.   No prior history of CAD Recent Labs    01/15/23 1125 01/15/23 1313  TROPONINIHS 10 19*   Chronic iron deficiency Ferritin level low at 12.  Hemoglobin level normal but using testosterone. Patient states he was hospitalized in August 2024 for acute diverticulitis with abscess.  He reports he followed up with Dr. Levora Angel with [name] as an outpatient.  His ferritin level was low.  Underwent endoscopy and  colonoscopy.  Colonoscopy showed diverticulosis.  He was positive for H. pylori and completed triple therapy regimen.  Patient's ferritin level was low at 8 at the time.  He believes it is because he donated blood 5 times in 2023. His ferritin level is still low at 12. Patient states he has been on Voltaren 75 mg twice daily regularly since 2007.  I  suspect he is having slow upper GI bleed due to Voltaren.  He is also being initiated on Eliquis.  I have advised him to stop Voltaren and switch to scheduled Tylenol.   Follow-up with PCP as an outpatient for periodic hemoglobin and iron check. Recent Labs    02/19/22 0908 08/24/22 0911 10/22/22 0952 10/23/22 0443 10/24/22 0641 10/25/22 0901 01/15/23 1125 01/16/23 0416  HGB 13.5  --    < > 12.5* 12.6* 13.6 14.6 14.4  MCV 67.2 Repeated and verified X2.*  --    < > 68.1* 67.7* 67.7* 72.1* 70.9*  VITAMINB12 957*  --   --   --   --   --   --   --   FERRITIN 8.6* 11.7*  --   --   --   --   --  12*  TIBC 522.2* 509.6*  --   --   --   --   --   --   IRON 29* 33*  --   --   --   --   --   --    < > = values in this interval not displayed.   OSA Does not tolerate CPAP. Cardiology has referred him to ENT to discuss Inspire device.  h/o prostate cancer  on testosterone shots weekly  Arthritis Tramadol as needed Would avoid NSAIDs given history of iron-deficiency anemia  Mobility: Encourage ambulation  Goals of care   Code Status: Full Code   Wounds:  -    Discharge Exam:   Vitals:   01/15/23 1800 01/15/23 2017 01/16/23 0503 01/16/23 1032  BP:  108/74 (!) 145/80   Pulse:  97  77  Resp:  20 20 18   Temp: 97.7 F (36.5 C) 98.5 F (36.9 C) 97.6 F (36.4 C) 98.2 F (36.8 C)  TempSrc: Oral Oral Oral Oral  SpO2:    98%  Weight:   115.7 kg   Height:        Body mass index is 36.59 kg/m.  General exam: Pleasant, middle-aged Caucasian male.  Not in pain Skin: No rashes, lesions or ulcers. HEENT: Atraumatic, normocephalic, no obvious bleeding Lungs: Clear to auscultation bilaterally CVS: Irregular rhythm, tachycardic, no murmur GI/Abd: soft, nontender, nondistended, bowel sound present CNS: Alert, awake, oriented x 3 Psychiatry: Mood appropriate Extremities: No pedal edema, no calf tenderness  Follow ups:    Follow-up Information     Lanier Prude, MD Follow  up.   Specialties: Cardiology, Radiology Why: 02/21/2023 at 2:30 Contact information: [address] [place] [address] [phone]         Christia Reading, MD Follow up.   Specialty: Otolaryngology Why: We have sent a referral for you.  They should call you with a follow-up appointment. Contact information: [address] [place] [address] [phone]                 Discharge Instructions:   Discharge Instructions     Call MD for:  difficulty breathing, headache or visual disturbances   Complete by: As directed  Call MD for:  extreme fatigue   Complete by: As directed    Call MD for:  hives   Complete by: As directed    Call MD for:  persistant dizziness or light-headedness   Complete by: As directed    Call MD for:  persistant nausea and vomiting   Complete by: As directed    Call MD for:  severe uncontrolled pain   Complete by: As directed    Call MD for:  temperature >100.4   Complete by: As directed    Diet general   Complete by: As directed    Discharge instructions   Complete by: As directed    Recommendations at discharge:   You been started on Toprol-XL 25 mg daily as well as Eliquis 5 mg twice daily for a month.  Follow-up with cardiology as an outpatient for possible ablation  Stop Voltaren.  Switch to Tylenol scheduled for pain control  Continue iron supplement  Outpatient referral given for ENT to discuss Inspire device for sleep apnea.  General discharge instructions: Follow with Primary MD Nelwyn Salisbury, MD in 7 days  Please request your PCP  to go over your hospital tests, procedures, radiology results at the follow up. Please get your medicines reviewed and adjusted.  Your PCP may decide to repeat certain labs or tests as needed. Do not drive, operate heavy machinery, perform activities at heights, swimming or participation in water activities or provide baby sitting services if your were admitted  for syncope or siezures until you have seen by Primary MD or a Neurologist and advised to do so again. [place] Controlled Substance Reporting System database was reviewed. Do not drive, operate heavy machinery, perform activities at heights, swim, participate in water activities or provide baby-sitting services while on medications for pain, sleep and mood until your outpatient physician has reevaluated you and advised to do so again.  You are strongly recommended to comply with the dose, frequency and duration of prescribed medications. Activity: As tolerated with Full fall precautions use walker/cane & assistance as needed Avoid using any recreational substances like cigarette, tobacco, alcohol, or non-prescribed drug. If you experience worsening of your admission symptoms, develop shortness of breath, life threatening emergency, suicidal or homicidal thoughts you must seek medical attention immediately by calling 911 or calling your MD immediately  if symptoms less severe. You must read complete instructions/literature along with all the possible adverse reactions/side effects for all the medicines you take and that have been prescribed to you. Take any new medicine only after you have completely understood and accepted all the possible adverse reactions/side effects.  Wear Seat belts while driving. You were cared for by a hospitalist during your hospital stay. If you have any questions about your discharge medications or the care you received while you were in the hospital after you are discharged, you can call the unit and ask to speak with the hospitalist or the covering physician. Once you are discharged, your primary care physician will handle any further medical issues. Please note that NO REFILLS for any discharge medications will be authorized once you are discharged, as it is imperative that you return to your primary care physician (or establish a relationship with a primary care physician  if you do not have one).   Increase activity slowly   Complete by: As directed        Discharge Medications:   Allergies as of 01/16/2023       Reactions  Flexeril [cyclobenzaprine]    Weird feeling   Oxycodone-acetaminophen Nausea Only   dizziness   Oxycodone-acetaminophen Nausea Only, Other (See Comments)   dizziness Weird feeling;  Hydrocodone on home med list 7/24 dizziness        Medication List     STOP taking these medications    diclofenac 75 MG EC tablet Commonly known as: VOLTAREN       TAKE these medications    acetaminophen 500 MG tablet Commonly known as: TYLENOL Take 2 tablets (1,000 mg total) by mouth every 8 (eight) hours.   anastrozole 1 MG tablet Commonly known as: ARIMIDEX TAKE 1 TABLET BY MOUTH EVERY DAY What changed:  when to take this additional instructions   apixaban 5 MG Tabs tablet Commonly known as: ELIQUIS Take 1 tablet (5 mg total) by mouth 2 (two) times daily.   Iron (Ferrous Sulfate) 325 (65 Fe) MG Tabs Take 325 mg by mouth 2 (two) times daily.   metoprolol succinate 25 MG 24 hr tablet Commonly known as: TOPROL-XL Take 1 tablet (25 mg total) by mouth daily. Start taking on: January 17, 2023   telmisartan 20 MG tablet Commonly known as: MICARDIS Take 1 tablet (20 mg total) by mouth in the morning and at bedtime.   testosterone cypionate 200 MG/ML injection Commonly known as: DEPOTESTOSTERONE CYPIONATE Inject 1 mL (200 mg total) into the muscle every 7 (seven) days.   traMADol 50 MG tablet Commonly known as: ULTRAM TAKE 2 TABLETS EVERY 6 HOURS AS NEEDED FOR PAIN   Vitamin D 125 MCG (5000 UT) Caps Take 5,000 Units by mouth daily.         The results of significant diagnostics from this hospitalization (including imaging, microbiology, ancillary and laboratory) are listed below for reference.    Procedures and Diagnostic Studies:   DG Chest Portable 1 View  Result Date: 01/15/2023 CLINICAL DATA:  Chest  pain EXAM: PORTABLE CHEST 1 VIEW COMPARISON:  01/17/2022 FINDINGS: External pacer/defibrillator. Cervical spine fixation. Apical lordotic positioning. Midline trachea. Mild cardiomegaly. No pleural effusion or pneumothorax. No congestive failure. Clear lungs. IMPRESSION: Cardiomegaly, without acute disease. Electronically Signed   By: Jeronimo Greaves M.D.   On: 01/15/2023 15:55     Labs:   Basic Metabolic Panel: Recent Labs  Lab 01/15/23 1125 01/16/23 0416  NA 137 139  K 4.7 3.7  CL 100 106  CO2 28 26  GLUCOSE 114* 108*  BUN 14 9  CREATININE 0.86 0.95  CALCIUM 9.8 9.1  MG  --  2.1   GFR Estimated Creatinine Clearance: 105.4 mL/min (by C-G formula based on SCr of 0.95 mg/dL). Liver Function Tests: No results for input(s): "AST", "ALT", "ALKPHOS", "BILITOT", "PROT", "ALBUMIN" in the last 168 hours. No results for input(s): "LIPASE", "AMYLASE" in the last 168 hours. No results for input(s): "AMMONIA" in the last 168 hours. Coagulation profile No results for input(s): "INR", "PROTIME" in the last 168 hours.  CBC: Recent Labs  Lab 01/15/23 1125 01/16/23 0416  WBC 5.2 7.7  HGB 14.6 14.4  HCT 47.2 46.1  MCV 72.1* 70.9*  PLT 321 340   Cardiac Enzymes: No results for input(s): "CKTOTAL", "CKMB", "CKMBINDEX", "TROPONINI" in the last 168 hours. BNP: Invalid input(s): "POCBNP" CBG: No results for input(s): "GLUCAP" in the last 168 hours. D-Dimer No results for input(s): "DDIMER" in the last 72 hours. Hgb A1c No results for input(s): "HGBA1C" in the last 72 hours. Lipid Profile No results for input(s): "CHOL", "HDL", "LDLCALC", "TRIG", "CHOLHDL", "LDLDIRECT"  in the last 72 hours. Thyroid function studies Recent Labs    01/15/23 1807  TSH 0.999   Anemia work up Recent Labs    01/16/23 0416  FERRITIN 12*   Microbiology No results found for this or any previous visit (from the past 240 hour(s)).  Time coordinating discharge: 45 minutes  Signed: Milo Solana  [facility] 01/16/2023, 1:27 PM

## 2023-01-16 NOTE — TOC Benefit Eligibility Note (Signed)
Patient Product/process development scientist completed.    The patient is insured through [employer]. Patient has Medicare and is not eligible for a copay card, but may be able to apply for patient assistance, if available.    Ran test claim for Eliquis 5 mg and the current 30 day co-pay is $45.00.   This test claim was processed through [facility]- copay amounts may vary at other pharmacies due to pharmacy/plan contracts, or as the patient moves through the different stages of their insurance plan.     Roland Earl, CPHT Pharmacy Technician III Certified Patient Advocate [facility] Patient Advocate Team Direct Number: [phone]  Fax: [phone]

## 2023-01-22 ENCOUNTER — Encounter: Payer: Self-pay | Admitting: Family Medicine

## 2023-01-22 ENCOUNTER — Ambulatory Visit (INDEPENDENT_AMBULATORY_CARE_PROVIDER_SITE_OTHER): Payer: Medicare HMO | Admitting: Family Medicine

## 2023-01-22 VITALS — BP 130/78 | HR 75 | Temp 98.4°F | Wt 235.0 lb

## 2023-01-22 DIAGNOSIS — I10 Essential (primary) hypertension: Secondary | ICD-10-CM

## 2023-01-22 DIAGNOSIS — I4891 Unspecified atrial fibrillation: Secondary | ICD-10-CM | POA: Diagnosis not present

## 2023-01-22 NOTE — Progress Notes (Signed)
   Subjective:    Patient ID: [name], male    DOB: [birth year], 62 y.o.   MRN: [id number]  HPI Here to follow up on a hospital stay from 01-15-23 to 01-16-23 for another bout of atrial fibrillation. He presented with palpitations, lightheadedness, and chest pressure. His HR was in the 150's. He was placed on a Diltiazem drip and overnight he converted to NSR. He was sent home on Eliquis 5 mg BID and Metoprolol succinate 25 mg daily was added. He was told to stop taking Diclofenac and to use Tylenol instead. They also felt that his undertreated sleep apnea was a likely trigger for the atrial fibrillation, so has has started using this again every night. He says he now feels better with more energy.    Review of Systems  Constitutional: Negative.   Respiratory: Negative.    Cardiovascular: Negative.        Objective:   Physical Exam Constitutional:      Appearance: Normal appearance.  Cardiovascular:     Rate and Rhythm: Normal rate and regular rhythm.     Pulses: Normal pulses.     Heart sounds: Normal heart sounds.  Pulmonary:     Effort: Pulmonary effort is normal.     Breath sounds: Normal breath sounds.  Musculoskeletal:     Right lower leg: No edema.     Left lower leg: No edema.  Neurological:     Mental Status: He is alert.           Assessment & Plan:  He is recovering from another bout of atrial fibrillation, and he is back in NSR. He understands how important it is for him to use his CPAP consistently. He is scheduled to have a cardiology follow up with Dr. Steffanie Dunn on 02-21-23. They will determine how long he needs to stay on Eliquis at that point. We spent a total of (34   ) minutes reviewing records and discussing these issues.  Gershon Crane, MD

## 2023-01-25 DIAGNOSIS — S93491A Sprain of other ligament of right ankle, initial encounter: Secondary | ICD-10-CM | POA: Diagnosis not present

## 2023-01-30 ENCOUNTER — Other Ambulatory Visit: Payer: Self-pay | Admitting: Family Medicine

## 2023-02-07 DIAGNOSIS — M25571 Pain in right ankle and joints of right foot: Secondary | ICD-10-CM | POA: Diagnosis not present

## 2023-02-10 ENCOUNTER — Encounter: Payer: Self-pay | Admitting: Family Medicine

## 2023-02-12 MED ORDER — METOPROLOL SUCCINATE ER 25 MG PO TB24: 25.0000 mg | ORAL_TABLET | Freq: Every day | ORAL | 0 refills | Status: DC

## 2023-02-12 NOTE — Telephone Encounter (Signed)
Pt has only 3 pill of metoprolol succinate (TOPROL-XL) 25 MG 24 hr tablet and does have an appt with cardiologist on 02-21-2023 and would like enough until he sees specialists  [id number] - [place], [place] - [address] Phone: [phone]  Fax: [phone]

## 2023-02-12 NOTE — Telephone Encounter (Signed)
Call in a 30 day supply  

## 2023-02-15 DIAGNOSIS — M7671 Peroneal tendinitis, right leg: Secondary | ICD-10-CM | POA: Diagnosis not present

## 2023-02-20 NOTE — Progress Notes (Unsigned)
Electrophysiology Office Note:    Date:  02/21/2023   ID:  INA STRAUSS, DOB November 13, 1960, MRN [id number]  [facility] Cardiologist:  Kristeen Miss, MD  [facility] Electrophysiologist:  Lanier Prude, MD   Referring MD: Nelwyn Salisbury, MD   Chief Complaint: Atrial fibrillation  History of Present Illness:     Mr. [name] is a 62 year old man who I am seeing today for an evaluation of atrial fibrillation at the request of Dr. Royann Shivers.  The patient has a history of prostate cancer, hypertension, anemia, obstructive sleep apnea and atrial fibrillation.  The patient was admitted to the hospital in November of this year.  At the time of presentation he was in atrial fibrillation with rapid ventricular rates.  He was temporarily on Eliquis but due to a low CHA2DS2-VASc score, the Eliquis was ultimately discontinued.  He has had GI bleeding in the past due to H. pylori infection with diverticulitis.  The episode of atrial fibrillation in November of this year was highly symptomatic with rates in the 150s. Eliquis was restarted during the hospitalization.  He was referred to discuss possible ablation for his atrial fibrillation.  Today he confirms the above history.  He tells me that his atrial fibrillation was diagnosed first in 2021 at the time of high caffeine intake.  He had another episode in 2022 when drinking ice water.  He has had at least 2 episodes in 2024, 1 while driving to the [place] and another occurred while at rest and was shorter lasting.  He exercises daily at the gym.  He thinks he may have had an episode in the past after steroid injection.  Rates during A-fib in the past have been as high as 170s to 180s.    Their past medical, social and family history was reveiwed.   ROS:   Please see the history of present illness.    All other systems reviewed and are negative.  EKGs/Labs/Other Studies Reviewed:    The following studies were reviewed today:  October 05, 2019 echo shows a EF of 55% RV normal No significant valvular disease  January 15, 2023 EKG shows atrial fibrillation with rapid ventricular rate       Physical Exam:    VS:  BP 118/76 (BP Location: Right Arm, Patient Position: Sitting)   Pulse 79   Ht 5\' 10"  (1.778 m)   Wt 237 lb 9.6 oz (107.8 kg)   SpO2 97%   BMI 34.09 kg/m     Wt Readings from Last 3 Encounters:  02/21/23 237 lb 9.6 oz (107.8 kg)  01/22/23 235 lb (106.6 kg)  01/16/23 255 lb (115.7 kg)     GEN: no distress CARD: RRR, No MRG RESP: No IWOB. CTAB.        ASSESSMENT AND PLAN:    1. Persistent atrial fibrillation (HCC)   2. Essential hypertension     #Persistent atrial fibrillation Symptomatic.  On Eliquis for stroke prophylaxis.  He has a CHA2DS2-VASc of 1 only for hypertension.  Treatment with Eliquis has been complicated by bleeding in the past.  Watchman cannot be considered with a CHA2DS2-VASc of 1.  I discussed treatment options for his atrial fibrillation at length during today's visit.  We discussed antiarrhythmic drug therapy and catheter ablation.  He would like to proceed with catheter ablation.  Discussed treatment options today for AF including antiarrhythmic drug therapy and ablation. Discussed risks, recovery and likelihood of success with each treatment strategy. Risk, benefits, and  alternatives to EP study and ablation for afib were discussed. These risks include but are not limited to stroke, bleeding, vascular damage, tamponade, perforation, damage to the esophagus, lungs, phrenic nerve and other structures, pulmonary vein stenosis, worsening renal function, coronary vasospasm and death.  Discussed potential need for repeat ablation procedures and antiarrhythmic drugs after an initial ablation. The patient understands these risk and wishes to proceed.  We will therefore proceed with catheter ablation at the next available time.  Carto, ICE, anesthesia are requested for the procedure.   Will also obtain CT PV protocol prior to the procedure to exclude LAA thrombus and further evaluate atrial anatomy.   #Hypertension At goal today.  Recommend checking blood pressures 1-2 times per week at home and recording the values.  Recommend bringing these recordings to the primary care physician.     Signed, Rossie Muskrat. Lalla Brothers, MD, [facility] 02/21/2023 8:01 PM    Electrophysiology [facility]

## 2023-02-21 ENCOUNTER — Encounter: Payer: Self-pay | Admitting: Cardiology

## 2023-02-21 ENCOUNTER — Ambulatory Visit: Payer: Medicare HMO | Attending: Cardiology | Admitting: Cardiology

## 2023-02-21 VITALS — BP 118/76 | HR 79 | Ht 70.0 in | Wt 237.6 lb

## 2023-02-21 DIAGNOSIS — I4819 Other persistent atrial fibrillation: Secondary | ICD-10-CM | POA: Diagnosis not present

## 2023-02-21 DIAGNOSIS — I10 Essential (primary) hypertension: Secondary | ICD-10-CM

## 2023-02-21 MED ORDER — APIXABAN 5 MG PO TABS: 5.0000 mg | ORAL_TABLET | Freq: Two times a day (BID) | ORAL | 3 refills | Status: DC

## 2023-02-21 NOTE — Patient Instructions (Signed)
Medication Instructions:  Your physician recommends that you continue on your current medications as directed. Please refer to the Current Medication list given to you today.  *If you need a refill on your cardiac medications before your next appointment, please call your pharmacy*  Lab Work: BMET and CBC - please have these done the first week of January at any [facility] location   Testing/Procedures: Cardiac CT-  We will call you to schedule your CT scan. It will be done about three weeks prior to your ablation. Your physician has requested that you have cardiac CT. Cardiac computed tomography (CT) is a painless test that uses an x-ray machine to take clear, detailed pictures of your heart. For further information please visit [email or web address]   Ablation- You are scheduled for Atrial Fibrillation Ablation on Tuesday, January 28 with Dr. Steffanie Dunn.Please arrive at the Main Entrance A at [facility]: [address] [place], [place] [address] at 8:00 AM  Your physician has recommended that you have an ablation. Catheter ablation is a medical procedure used to treat some cardiac arrhythmias (irregular heartbeats). During catheter ablation, a long, thin, flexible tube is put into a blood vessel in your groin (upper thigh), or neck. This tube is called an ablation catheter. It is then guided to your heart through the blood vessel. Radio frequency waves destroy small areas of heart tissue where abnormal heartbeats may cause an arrhythmia to start. Please see the instruction sheet given to you today.  Follow-Up: At [facility], you and your health needs are our priority.  As part of our continuing mission to provide you with exceptional heart care, we have created designated Provider Care Teams.  These Care Teams include your primary Cardiologist (physician) and Advanced Practice Providers (APPs -  Physician Assistants and Nurse Practitioners) who all work together to provide  you with the care you need, when you need it.  Your next appointment:   We will call you to schedule your follow up appointments

## 2023-03-05 DIAGNOSIS — I4819 Other persistent atrial fibrillation: Secondary | ICD-10-CM | POA: Diagnosis not present

## 2023-03-05 DIAGNOSIS — I10 Essential (primary) hypertension: Secondary | ICD-10-CM | POA: Diagnosis not present

## 2023-03-06 LAB — BASIC METABOLIC PANEL
BUN/Creatinine Ratio: 21 (ref 10–24)
BUN: 19 mg/dL (ref 8–27)
CO2: 23 mmol/L (ref 20–29)
Calcium: 9.7 mg/dL (ref 8.6–10.2)
Chloride: 101 mmol/L (ref 96–106)
Creatinine, Ser: 0.91 mg/dL (ref 0.76–1.27)
Glucose: 100 mg/dL — ABNORMAL HIGH (ref 70–99)
Potassium: 4.3 mmol/L (ref 3.5–5.2)
Sodium: 140 mmol/L (ref 134–144)
eGFR: 96 mL/min/{1.73_m2} (ref >59)

## 2023-03-06 LAB — CBC
Hematocrit: 48.3 % (ref 37.5–51.0)
Hemoglobin: 14.6 g/dL (ref 13.0–17.7)
MCH: 22.2 pg — ABNORMAL LOW (ref 26.6–33.0)
MCHC: 30.2 g/dL — ABNORMAL LOW (ref 31.5–35.7)
MCV: 73 fL — ABNORMAL LOW (ref 79–97)
Platelets: 290 10*3/uL (ref 150–450)
RBC: 6.59 x10E6/uL — ABNORMAL HIGH (ref 4.14–5.80)
RDW: 19.6 % — ABNORMAL HIGH (ref 11.6–15.4)
WBC: 6.7 10*3/uL (ref 3.4–10.8)

## 2023-03-08 ENCOUNTER — Other Ambulatory Visit: Payer: Self-pay | Admitting: Family Medicine

## 2023-03-13 ENCOUNTER — Telehealth (HOSPITAL_COMMUNITY): Payer: Self-pay | Admitting: *Deleted

## 2023-03-13 ENCOUNTER — Telehealth: Payer: Self-pay

## 2023-03-13 ENCOUNTER — Ambulatory Visit: Payer: Medicare HMO

## 2023-03-13 VITALS — Ht 70.0 in | Wt 230.0 lb

## 2023-03-13 DIAGNOSIS — Z0000 Encounter for general adult medical examination without abnormal findings: Secondary | ICD-10-CM

## 2023-03-13 NOTE — Telephone Encounter (Signed)
 Attempted to call patient regarding upcoming cardiac CT appointment. Left message on voicemail with name and callback number Johney Frame RN Navigator Cardiac Imaging [facility] [phone] Office

## 2023-03-13 NOTE — Patient Instructions (Addendum)
 [name] , Thank you for taking time to come for your Medicare Wellness Visit. I appreciate your ongoing commitment to your health goals. Please review the following plan we discussed and let me know if I can assist you in the future.   Referrals/Orders/Follow-Ups/Clinician Recommendations:   This is a list of the screening recommended for you and due dates:  Health Maintenance  Topic Date Due   COVID-19 Vaccine (3 - Pfizer risk series) 12/01/2019   Flu Shot  10/04/2022   Zoster (Shingles) Vaccine (1 of 2) 09/02/2023*   Medicare Annual Wellness Visit  03/12/2024   DTaP/Tdap/Td vaccine (2 - Td or Tdap) 11/29/2026   Colon Cancer Screening  10/17/2032   Hepatitis C Screening  Completed   HIV Screening  Completed   HPV Vaccine  Aged Out  *Topic was postponed. The date shown is not the original due date.  Opioid Pain Medicine Management Opioids are powerful medicines that are used to treat moderate to severe pain. When used for short periods of time, they can help you to: Sleep better. Do better in physical or occupational therapy. Feel better in the first few days after an injury. Recover from surgery. Opioids should be taken with the supervision of a trained health care provider. They should be taken for the shortest period of time possible. This is because opioids can be addictive, and the longer you take opioids, the greater your risk of addiction. This addiction can also be called opioid use disorder. What are the risks? Using opioid pain medicines for longer than 3 days increases your risk of side effects. Side effects include: Constipation. Nausea and vomiting. Breathing difficulties (respiratory depression). Drowsiness. Confusion. Opioid use disorder. Itching. Taking opioid pain medicine for a long period of time can affect your ability to do daily tasks. It also puts you at risk for: Motor vehicle crashes. Depression. Suicide. Heart attack. Overdose, which can be  life-threatening. What is a pain treatment plan? A pain treatment plan is an agreement between you and your health care provider. Pain is unique to each person, and treatments vary depending on your condition. To manage your pain, you and your health care provider need to work together. To help you do this: Discuss the goals of your treatment, including how much pain you might expect to have and how you will manage the pain. Review the risks and benefits of taking opioid medicines. Remember that a good treatment plan uses more than one approach and minimizes the chance of side effects. Be honest about the amount of medicines you take and about any drug or alcohol use. Get pain medicine prescriptions from only one health care provider. Pain can be managed with many types of alternative treatments. Ask your health care provider to refer you to one or more specialists who can help you manage pain through: Physical or occupational therapy. Counseling (cognitive behavioral therapy). Good nutrition. Biofeedback. Massage. Meditation. Non-opioid medicine. Following a gentle exercise program. How to use opioid pain medicine Taking medicine Take your pain medicine exactly as told by your health care provider. Take it only when you need it. If your pain gets less severe, you may take less than your prescribed dose if your health care provider approves. If you are not having pain, do nottake pain medicine unless your health care provider tells you to take it. If your pain is severe, do nottry to treat it yourself by taking more pills than instructed on your prescription. Contact your health care provider for  help. Write down the times when you take your pain medicine. It is easy to become confused while on pain medicine. Writing the time can help you avoid overdose. Take other over-the-counter or prescription medicines only as told by your health care provider. Keeping yourself and others safe  While  you are taking opioid pain medicine: Do not drive, use machinery, or power tools. Do not sign legal documents. Do not drink alcohol. Do not take sleeping pills. Do not supervise children by yourself. Do not do activities that require climbing or being in high places. Do not go to a lake, river, ocean, spa, or swimming pool. Do not share your pain medicine with anyone. Keep pain medicine in a locked cabinet or in a secure area where pets and children cannot reach it. Stopping your use of opioids If you have been taking opioid medicine for more than a few weeks, you may need to slowly decrease (taper) how much you take until you stop completely. Tapering your use of opioids can decrease your risk of symptoms of withdrawal, such as: Pain and cramping in the abdomen. Nausea. Sweating. Sleepiness. Restlessness. Uncontrollable shaking (tremors). Cravings for the medicine. Do not attempt to taper your use of opioids on your own. Talk with your health care provider about how to do this. Your health care provider may prescribe a step-down schedule based on how much medicine you are taking and how long you have been taking it. Getting rid of leftover pills Do not save any leftover pills. Get rid of leftover pills safely by: Taking the medicine to a prescription take-back program. This is usually offered by the county or law enforcement. Bringing them to a pharmacy that has a drug disposal container. Flushing them down the toilet. Check the label or package insert of your medicine to see whether this is safe to do. Throwing them out in the trash. Check the label or package insert of your medicine to see whether this is safe to do. If it is safe to throw it out, remove the medicine from the original container, put it into a sealable bag or container, and mix it with used coffee grounds, food scraps, dirt, or cat litter before putting it in the trash. Follow these instructions at home: Activity Do  exercises as told by your health care provider. Avoid activities that make your pain worse. Return to your normal activities as told by your health care provider. Ask your health care provider what activities are safe for you. General instructions You may need to take these actions to prevent or treat constipation: Drink enough fluid to keep your urine pale yellow. Take over-the-counter or prescription medicines. Eat foods that are high in fiber, such as beans, whole grains, and fresh fruits and vegetables. Limit foods that are high in fat and processed sugars, such as fried or sweet foods. Keep all follow-up visits. This is important. Where to find support If you have been taking opioids for a long time, you may benefit from receiving support for quitting from a local support group or counselor. Ask your health care provider for a referral to these resources in your area. Where to find more information Centers for Disease Control and Prevention (CDC): footballexhibition.com.br [place] Food and Drug Administration (FDA): pumpkinsearch.com.ee Get help right away if: You may have taken too much of an opioid (overdosed). Common symptoms of an overdose: Your breathing is slower or more shallow than normal. You have a very slow heartbeat (pulse). You have slurred speech.  You have nausea and vomiting. Your pupils become very small. You have other potential symptoms: You are very confused. You faint or feel like you will faint. You have cold, clammy skin. You have blue lips or fingernails. You have thoughts of harming yourself or harming others. These symptoms may represent a serious problem that is an emergency. Do not wait to see if the symptoms will go away. Get medical help right away. Call your local emergency services (911 in the [place] Do not drive yourself to the hospital.  If you ever feel like you may hurt yourself or others, or have thoughts about taking your own life, get help right away. Go to your nearest  emergency department or: Call your local emergency services (911 in the [place] Call the [facility] ((318)355-8479 in the [place] Call a suicide crisis helpline, such as the National Suicide Prevention Lifeline at (708) 675-6598 or 988 in the [place] This is open 24 hours a day in the [place] Text the Crisis Text Line at 413 874 7784 (in the [place] Summary Opioid medicines can help you manage moderate to severe pain for a short period of time. A pain treatment plan is an agreement between you and your health care provider. Discuss the goals of your treatment, including how much pain you might expect to have and how you will manage the pain. If you think that you or someone else may have taken too much of an opioid, get medical help right away. This information is not intended to replace advice given to you by your health care provider. Make sure you discuss any questions you have with your health care provider. Document Revised: 09/14/2020 Document Reviewed: 06/01/2020 Elsevier Patient Education  2024 [employer]   Advanced directives: (Declined) Advance directive discussed with you today. Even though you declined this today, please call our office should you change your mind, and we can give you the proper paperwork for you to fill out.  Next Medicare Annual Wellness Visit scheduled for next year: Yes

## 2023-03-13 NOTE — Telephone Encounter (Signed)
 Spoke with pt stated that he contacted his pharmacy and that he is waiting for them to fill Rx.

## 2023-03-13 NOTE — Telephone Encounter (Signed)
 Copied from CRM [id number]. Topic: Clinical - Medication Refill >> Mar 13, 2023  9:06 AM Rosina BIRCH wrote: Most Recent Primary Care Visit:  Provider: JOHNNY SENIOR A  Department: [facility]  Visit Type: OFFICE VISIT  Date: 01/22/2023  Medication: metoprolol  succinate  Has the patient contacted their pharmacy? Yes (Agent: If no, request that the patient contact the pharmacy for the refill. If patient does not wish to contact the pharmacy document the reason why and proceed with request.) (Agent: If yes, when and what did the pharmacy advise?)  Is this the correct pharmacy for this prescription? Yes If no, delete pharmacy and type the correct one.  This is the patient's preferred pharmacy:  [id number] - [place], [place] - [address] [place] [address] Phone: [phone] Fax: [phone]   Has the prescription been filled recently? No  Is the patient out of the medication? Yes  Has the patient been seen for an appointment in the last year OR does the patient have an upcoming appointment? Yes  Can we respond through MyChart? Yes  Agent: Please be advised that Rx refills may take up to 3 business days. We ask that you follow-up with your pharmacy.

## 2023-03-13 NOTE — Telephone Encounter (Signed)
 Copied from CRM [id number]. Topic: Clinical - Medication Question >> Mar 13, 2023  4:37 PM Suzette B wrote: Reason for CRM: patient was calling to check on the refill of his  metoprolol succinate (TOPROL-XL) 25 MG 24 hr tablet

## 2023-03-13 NOTE — Telephone Encounter (Signed)
 Done

## 2023-03-13 NOTE — Progress Notes (Signed)
 Subjective:   [name] is a 63 y.o. male who presents for Medicare Annual/Subsequent preventive examination.  Visit Complete: Virtual I connected with  [name] on 03/13/23 by a audio enabled telemedicine application and verified that I am speaking with the correct person using two identifiers.  Patient Location: Home  Provider Location: Home Office  I discussed the limitations of evaluation and management by telemedicine. The patient expressed understanding and agreed to proceed.  Vital Signs: Because this visit was a virtual/telehealth visit, some criteria may be missing or patient reported. Any vitals not documented were not able to be obtained and vitals that have been documented are patient reported.  Patient Medicare AWV questionnaire was completed by the patient on 03/11/23; I have confirmed that all information answered by patient is correct and no changes since this date.  Cardiac Risk Factors include: advanced age (>47men, >37 women);male gender;hypertension     Objective:    Today's Vitals   03/13/23 0848  Weight: 230 lb (104.3 kg)  Height: 5' 10 (1.778 m)   Body mass index is 33 kg/m.     03/13/2023    8:58 AM 01/16/2023   12:04 PM 01/15/2023    4:17 PM 01/15/2023   11:38 AM 10/22/2022    9:49 AM 03/07/2022    9:18 AM 02/07/2021    3:50 PM  Advanced Directives  Does Patient Have a Medical Advance Directive? No No No No No No No  Would patient like information on creating a medical advance directive? No - Patient declined No - Patient declined No - Patient declined No - Patient declined No - Patient declined No - Patient declined     Current Medications (verified) Outpatient Encounter Medications as of 03/13/2023  Medication Sig   anastrozole  (ARIMIDEX ) 1 MG tablet TAKE 1 TABLET BY MOUTH EVERY DAY   apixaban  (ELIQUIS ) 5 MG TABS tablet Take 1 tablet (5 mg total) by mouth 2 (two) times daily.   Cholecalciferol (VITAMIN D) 125 MCG (5000 UT) CAPS Take  5,000 Units by mouth daily.   Iron , Ferrous Sulfate , 325 (65 Fe) MG TABS Take 325 mg by mouth 2 (two) times daily.   metoprolol  succinate (TOPROL -XL) 25 MG 24 hr tablet TAKE 1 TABLET (25 MG TOTAL) BY MOUTH DAILY.   telmisartan  (MICARDIS ) 20 MG tablet Take 1 tablet (20 mg total) by mouth in the morning and at bedtime.   testosterone  cypionate (DEPOTESTOSTERONE CYPIONATE) 200 MG/ML injection Inject 1 mL (200 mg total) into the muscle every 7 (seven) days.   traMADol  (ULTRAM ) 50 MG tablet TAKE 2 TABLETS EVERY 6 HOURS AS NEEDED FOR PAIN   No facility-administered encounter medications on file as of 03/13/2023.    Allergies (verified) Flexeril [cyclobenzaprine], Oxycodone -acetaminophen , and Oxycodone -acetaminophen    History: Past Medical History:  Diagnosis Date   Arthritis    Cancer (HCC)    prostate, sees Dr. Alline    Colon polyps    Degenerative disc disease, cervical    Degenerative disc disease, lumbar    Depression    GERD (gastroesophageal reflux disease)    Low back pain    Osteopenia    Past Surgical History:  Procedure Laterality Date   CERVICAL FUSION  2015   C3 through C7, per Dr. Alvan    COLONOSCOPY  10/18/2022   per Dr. Elicia, clear, repeat in 5 yrs (has a hx of adenomatous polyps)   CYSTECTOMY     benign, right hip   ESOPHAGOGASTRODUODENOSCOPY  10/18/2022  per Dr. Elicia, showed H pylori and Candida   GYNECOMASTIA EXCISION     bilateral, reduction   HIP SURGERY Right    12/2020   JOINT REPLACEMENT  12/20/2011   hx. LTHA, now RTHA planned   LAMINECTOMY  04/2007   lumbar   LUMBAR FUSION  2015   L4 through S1, per Dr. Alvan    LUMBAR MICRODISCECTOMY  10/15/2008   L-4-5, per Dr. Carlin Alvan at [facility]   PROSTATE SURGERY  12/13/2010   robotic prostatectomy per Dr. Alline    TOTAL HIP ARTHROPLASTY  12/20/2011   2010 left hip   TOTAL HIP ARTHROPLASTY  12/26/2011   Procedure: TOTAL HIP ARTHROPLASTY;  Surgeon: Dempsey LULLA Moan, MD;   Location: [facility];  Service: Orthopedics;  Laterality: Right;   Family History  Problem Relation Age of Onset   Hypertension Father    Suicidality Father    Healthy Mother    Arthritis Other    Cancer Brother        Bone cancer   Social History   Socioeconomic History   Marital status: Divorced    Spouse name: Not on file   Number of children: Not on file   Years of education: Not on file   Highest education level: 12th [name]  Occupational History   Not on file  Tobacco Use   Smoking status: Never   Smokeless tobacco: Never  Vaping Use   Vaping status: Never Used  Substance and Sexual Activity   Alcohol use: No    Alcohol/week: 0.0 standard drinks of alcohol    Comment: rare   Drug use: No   Sexual activity: Yes  Other Topics Concern   Not on file  Social History Narrative   Lives alone in a one story home.  No children.     On disability since September 2016.  He was previously working in [employer].    Education: high school.   Social Drivers of Corporate Investment Banker Strain: Low Risk  (03/13/2023)   Overall Financial Resource Strain (CARDIA)    Difficulty of Paying Living Expenses: Not hard at all  Recent Concern: Financial Resource Strain - Medium Risk (12/20/2022)   Overall Financial Resource Strain (CARDIA)    Difficulty of Paying Living Expenses: Somewhat hard  Food Insecurity: No Food Insecurity (03/13/2023)   Hunger Vital Sign    Worried About Running Out of Food in the Last Year: Never true    Ran Out of Food in the Last Year: Never true  Transportation Needs: No Transportation Needs (03/13/2023)   PRAPARE - Administrator, Civil Service (Medical): No    Lack of Transportation (Non-Medical): No  Physical Activity: Sufficiently Active (03/13/2023)   Exercise Vital Sign    Days of Exercise per Week: 4 days    Minutes of Exercise per Session: 60 min  Stress: No Stress Concern Present (03/13/2023)   [employer] of Occupational Health - Occupational  Stress Questionnaire    Feeling of Stress : Not at all  Social Connections: Socially Isolated (03/13/2023)   Social Connection and Isolation Panel [NHANES]    Frequency of Communication with Friends and Family: More than three times a week    Frequency of Social Gatherings with Friends and Family: More than three times a week    Attends Religious Services: Never    Database Administrator or Organizations: No    Attends Banker Meetings: Never    Marital Status:  Divorced    Tobacco Counseling Counseling given: Not Answered   Clinical Intake:  Pre-visit preparation completed: Yes  Pain : No/denies pain     BMI - recorded: 33 Nutritional Status: BMI > 30  Obese Nutritional Risks: None Diabetes: No  How often do you need to have someone help you when you read instructions, pamphlets, or other written materials from your doctor or pharmacy?: 1 - Never  Interpreter Needed?: No  Information entered by :: Rojelio Blush LPN   Activities of Daily Living    03/13/2023    8:57 AM 03/11/2023    9:01 PM  In your present state of health, do you have any difficulty performing the following activities:  Hearing? 0 0  Vision? 0 0  Difficulty concentrating or making decisions? 0 0  Walking or climbing stairs? 1 1  Comment Uses a Cane   Dressing or bathing? 0 0  Doing errands, shopping? 0 0  Preparing Food and eating ? N N  Using the Toilet? N N  In the past six months, have you accidently leaked urine? N N  Do you have problems with loss of bowel control? N N  Managing your Medications? N N  Managing your Finances? N N  Housekeeping or managing your Housekeeping? [name]    Patient Care Team: Johnny Garnette LABOR, MD as PCP - General Nahser, Aleene PARAS, MD as PCP - Cardiology (Cardiology) Cindie Ole DASEN, MD as PCP - Electrophysiology (Cardiology)  Indicate any recent Medical Services you may have received from other than [facility] providers in the past year (date may be  approximate).     Assessment:   This is a routine wellness examination for [name].  Hearing/Vision screen Hearing Screening - Comments:: Denies hearing difficulties   Vision Screening - Comments:: Wears reading glasses - up to date with routine eye exams with  Dr Raelyn   Goals Addressed               This Visit's Progress     Increase physical activity (pt-stated)         Depression Screen    03/13/2023    8:55 AM 11/01/2022   10:30 AM 07/26/2022    3:21 PM 03/07/2022    9:14 AM 01/17/2022    8:50 AM 11/17/2021    8:48 AM 10/03/2021    8:29 AM  PHQ 2/9 Scores  PHQ - 2 Score 0 0 0 0 0 0 0  PHQ- 9 Score  3 1 0 3 4 0    Fall Risk    03/13/2023    8:58 AM 03/13/2023    8:57 AM 03/11/2023    9:01 PM 11/01/2022   10:29 AM 07/26/2022    3:20 PM  Fall Risk   Falls in the past year? 0 0 0 0 0  Number falls in past yr: 0 0  0 0  Injury with Fall? 0 0  0 0  Risk for fall due to : No Fall Risks No Fall Risks  No Fall Risks No Fall Risks  Follow up Falls prevention discussed Falls prevention discussed  Falls evaluation completed Falls evaluation completed    MEDICARE RISK AT HOME: Medicare Risk at Home Any stairs in or around the home?: (Patient-Rptd) No If so, are there any without handrails?: (Patient-Rptd) No Home free of loose throw rugs in walkways, pet beds, electrical cords, etc?: (Patient-Rptd) Yes Adequate lighting in your home to reduce risk of falls?: (Patient-Rptd) Yes Life alert?: (Patient-Rptd)  No Use of a cane, walker or w/c?: (Patient-Rptd) Yes Grab bars in the bathroom?: (Patient-Rptd) Yes Shower chair or bench in shower?: (Patient-Rptd) No Elevated toilet seat or a handicapped toilet?: (Patient-Rptd) Yes  TIMED UP AND GO:  Was the test performed?  No    Cognitive Function:        03/13/2023    8:58 AM 03/07/2022    9:19 AM 02/07/2021    3:54 PM  6CIT Screen  What Year? 0 points 0 points 0 points  What month? 0 points 0 points 0 points  What time? 0  points 0 points 0 points  Count back from 20 0 points 0 points 0 points  Months in reverse 0 points 0 points 0 points  Repeat phrase 0 points 0 points 0 points  Total Score 0 points 0 points 0 points    Immunizations Immunization History  Administered Date(s) Administered   Influenza,inj,Quad PF,6+ Mos 11/28/2016, 01/22/2018, 12/22/2018, 01/20/2021, 02/19/2022   Influenza-Unspecified 12/22/2018, 01/23/2019   PFIZER(Purple Top)SARS-COV-2 Vaccination 10/09/2019, 11/03/2019   Tdap 11/28/2016    TDAP status: Up to date  Flu Vaccine status: Declined, Education has been provided regarding the importance of this vaccine but patient still declined. Advised may receive this vaccine at local pharmacy or Health Dept. Aware to provide a copy of the vaccination record if obtained from local pharmacy or Health Dept. Verbalized acceptance and understanding.    Covid-19 vaccine status: Declined, Education has been provided regarding the importance of this vaccine but patient still declined. Advised may receive this vaccine at local pharmacy or Health Dept.or vaccine clinic. Aware to provide a copy of the vaccination record if obtained from local pharmacy or Health Dept. Verbalized acceptance and understanding.    Screening Tests Health Maintenance  Topic Date Due   COVID-19 Vaccine (3 - Pfizer risk series) 12/01/2019   INFLUENZA VACCINE  10/04/2022   Zoster Vaccines- Shingrix (1 of 2) 09/02/2023 (Originally 04/10/1979)   Medicare Annual Wellness (AWV)  03/12/2024   DTaP/Tdap/Td (2 - Td or Tdap) 11/29/2026   Colonoscopy  10/17/2032   Hepatitis C Screening  Completed   HIV Screening  Completed   HPV VACCINES  Aged Out    Health Maintenance  Health Maintenance Due  Topic Date Due   COVID-19 Vaccine (3 - Pfizer risk series) 12/01/2019   INFLUENZA VACCINE  10/04/2022    Colorectal cancer screening: Type of screening: Colonoscopy. Completed 10/18/22. Repeat every 10 years     Additional  Screening:  Hepatitis C Screening: does qualify; Completed 07/31/17  Vision Screening: Recommended annual ophthalmology exams for early detection of glaucoma and other disorders of the eye. Is the patient up to date with their annual eye exam?  Yes  Who is the provider or what is the name of the office in which the patient attends annual eye exams? Dr Raelyn If pt is not established with a provider, would they like to be referred to a provider to establish care? No .   Dental Screening: Recommended annual dental exams for proper oral hygiene   Community Resource Referral / Chronic Care Management:  CRR required this visit?  No   CCM required this visit?  No     Plan:     I have personally reviewed and noted the following in the patient's chart:   Medical and social history Use of alcohol, tobacco or illicit drugs  Current medications and supplements including opioid prescriptions. Patient is currently taking opioid prescriptions. Information provided to patient  regarding non-opioid alternatives. Patient advised to discuss non-opioid treatment plan with their provider. Functional ability and status Nutritional status Physical activity Advanced directives List of other physicians Hospitalizations, surgeries, and ER visits in previous 12 months Vitals Screenings to include cognitive, depression, and falls Referrals and appointments  In addition, I have reviewed and discussed with patient certain preventive protocols, quality metrics, and best practice recommendations. A written personalized care plan for preventive services as well as general preventive health recommendations were provided to patient.     Rojelio LELON Blush, LPN   10/03/7972   After Visit Summary: (MyChart) Due to this being a telephonic visit, the after visit summary with patients personalized plan was offered to patient via MyChart   Nurse Notes: None

## 2023-03-14 ENCOUNTER — Ambulatory Visit (HOSPITAL_COMMUNITY)
Admission: RE | Admit: 2023-03-14 | Discharge: 2023-03-14 | Disposition: A | Discharge status: Home / Self Care | Payer: Medicare HMO | Source: Ambulatory Visit | Attending: Cardiology | Admitting: Cardiology

## 2023-03-14 DIAGNOSIS — A048 Other specified bacterial intestinal infections: Secondary | ICD-10-CM | POA: Diagnosis not present

## 2023-03-14 DIAGNOSIS — I4819 Other persistent atrial fibrillation: Secondary | ICD-10-CM | POA: Diagnosis not present

## 2023-03-14 DIAGNOSIS — I10 Essential (primary) hypertension: Secondary | ICD-10-CM | POA: Diagnosis not present

## 2023-03-14 MED ORDER — IOHEXOL 350 MG/ML SOLN
100.0000 mL | Freq: Once | INTRAVENOUS | Status: AC | PRN
  Administered 2023-03-14: 100 mL via INTRAVENOUS

## 2023-03-20 ENCOUNTER — Telehealth: Payer: Self-pay | Admitting: Cardiology

## 2023-03-20 NOTE — Telephone Encounter (Signed)
 Patient said that he was put on these medications for 14 days  Omeprazole  40 mg Doxcycline 100 mg Metronidazole 500mg   For bacteria in stomach. Want to know if he should start medication today since he will have an ablation done before he is finished taking medication

## 2023-03-20 NOTE — Telephone Encounter (Signed)
 Patient tested positive for H. Pylori and was prescribed the below medications. Per Dr. [name] patient should be reschedule until he completes his medications.  Patient will be moved to 2/14 at 1230. He would like updated instructions mailed to him.

## 2023-04-02 ENCOUNTER — Encounter: Payer: Self-pay | Admitting: Family Medicine

## 2023-04-03 ENCOUNTER — Telehealth: Payer: Self-pay

## 2023-04-03 ENCOUNTER — Other Ambulatory Visit: Payer: Self-pay

## 2023-04-03 DIAGNOSIS — I10 Essential (primary) hypertension: Secondary | ICD-10-CM

## 2023-04-03 MED ORDER — TESTOSTERONE CYPIONATE 200 MG/ML IM SOLN: 200.0000 mg | INTRAMUSCULAR | 5 refills | Status: DC

## 2023-04-03 MED ORDER — METOPROLOL SUCCINATE ER 25 MG PO TB24: 25.0000 mg | ORAL_TABLET | Freq: Every day | ORAL | 0 refills | Status: DC

## 2023-04-03 MED ORDER — TELMISARTAN 20 MG PO TABS: 20.0000 mg | ORAL_TABLET | Freq: Two times a day (BID) | ORAL | 3 refills | Status: DC

## 2023-04-03 NOTE — Telephone Encounter (Signed)
Done

## 2023-04-03 NOTE — Telephone Encounter (Signed)
Copied from CRM [id number]. Topic: General - Other >> Apr 03, 2023 10:53 AM Truddie Crumble wrote: Reason for CRM: Ivy from [facility] is calling checking on a status of a prescription-metoprolol succinate and telmisartan  CB [phone]

## 2023-04-04 ENCOUNTER — Telehealth: Payer: Self-pay

## 2023-04-04 ENCOUNTER — Other Ambulatory Visit (HOSPITAL_COMMUNITY): Payer: Self-pay

## 2023-04-04 NOTE — Telephone Encounter (Signed)
Pharmacy Patient Advocate Encounter   Received notification from CoverMyMeds that prior authorization for Testosterone Cypionate 200MG /ML intramuscular solution is required/requested.   Insurance verification completed.   The patient is insured through [employer] .   Per test claim: Refill too soon. PA is not needed at this time. Medication was filled 03-27-2023. Next eligible fill date is **.   ** 10mL filled.

## 2023-04-05 DIAGNOSIS — I4819 Other persistent atrial fibrillation: Secondary | ICD-10-CM

## 2023-04-08 ENCOUNTER — Other Ambulatory Visit: Payer: Self-pay | Admitting: Family Medicine

## 2023-04-10 NOTE — Telephone Encounter (Signed)
 Updated Instruction letter mailed to pt.

## 2023-04-12 ENCOUNTER — Other Ambulatory Visit (HOSPITAL_COMMUNITY): Payer: Self-pay

## 2023-04-12 ENCOUNTER — Ambulatory Visit (INDEPENDENT_AMBULATORY_CARE_PROVIDER_SITE_OTHER): Payer: Medicare HMO | Admitting: Family Medicine

## 2023-04-12 ENCOUNTER — Encounter: Payer: Self-pay | Admitting: Family Medicine

## 2023-04-12 VITALS — BP 132/78 | HR 82 | Temp 98.1°F | Ht 70.0 in | Wt 240.0 lb

## 2023-04-12 DIAGNOSIS — I4891 Unspecified atrial fibrillation: Secondary | ICD-10-CM | POA: Diagnosis not present

## 2023-04-12 DIAGNOSIS — E785 Hyperlipidemia, unspecified: Secondary | ICD-10-CM

## 2023-04-12 DIAGNOSIS — R739 Hyperglycemia, unspecified: Secondary | ICD-10-CM

## 2023-04-12 DIAGNOSIS — E291 Testicular hypofunction: Secondary | ICD-10-CM | POA: Diagnosis not present

## 2023-04-12 DIAGNOSIS — R748 Abnormal levels of other serum enzymes: Secondary | ICD-10-CM

## 2023-04-12 LAB — HEPATIC FUNCTION PANEL
ALT: 81 U/L — ABNORMAL HIGH (ref 0–53)
AST: 54 U/L — ABNORMAL HIGH (ref 0–37)
Albumin: 4.5 g/dL (ref 3.5–5.2)
Alkaline Phosphatase: 81 U/L (ref 39–117)
Bilirubin, Direct: 0.1 mg/dL (ref 0.0–0.3)
Total Bilirubin: 1 mg/dL (ref 0.2–1.2)
Total Protein: 7.1 g/dL (ref 6.0–8.3)

## 2023-04-12 LAB — LIPID PANEL
Cholesterol: 220 mg/dL — ABNORMAL HIGH (ref 0–200)
HDL: 40.9 mg/dL (ref >39.00)
LDL Cholesterol: 150 mg/dL — ABNORMAL HIGH (ref 0–99)
NonHDL: 178.9
Total CHOL/HDL Ratio: 5
Triglycerides: 143 mg/dL (ref 0.0–149.0)
VLDL: 28.6 mg/dL (ref 0.0–40.0)

## 2023-04-12 LAB — CBC WITH DIFFERENTIAL/PLATELET
Basophils Absolute: 0.1 10*3/uL (ref 0.0–0.1)
Basophils Relative: 1.7 % (ref 0.0–3.0)
Eosinophils Absolute: 0.2 10*3/uL (ref 0.0–0.7)
Eosinophils Relative: 3.6 % (ref 0.0–5.0)
HCT: 47.9 % (ref 39.0–52.0)
Hemoglobin: 15.3 g/dL (ref 13.0–17.0)
Lymphocytes Relative: 22.4 % (ref 12.0–46.0)
Lymphs Abs: 1 10*3/uL (ref 0.7–4.0)
MCHC: 31.9 g/dL (ref 30.0–36.0)
MCV: 72 fL — ABNORMAL LOW (ref 78.0–100.0)
Monocytes Absolute: 0.6 10*3/uL (ref 0.1–1.0)
Monocytes Relative: 14.8 % — ABNORMAL HIGH (ref 3.0–12.0)
Neutro Abs: 2.5 10*3/uL (ref 1.4–7.7)
Neutrophils Relative %: 57.5 % (ref 43.0–77.0)
Platelets: 223 10*3/uL (ref 150.0–400.0)
RBC: 6.65 Mil/uL — ABNORMAL HIGH (ref 4.22–5.81)
RDW: 21.3 % — ABNORMAL HIGH (ref 11.5–15.5)
WBC: 4.3 10*3/uL (ref 4.0–10.5)

## 2023-04-12 LAB — BASIC METABOLIC PANEL
BUN: 13 mg/dL (ref 6–23)
CO2: 29 meq/L (ref 19–32)
Calcium: 9.4 mg/dL (ref 8.4–10.5)
Chloride: 102 meq/L (ref 96–112)
Creatinine, Ser: 0.91 mg/dL (ref 0.40–1.50)
GFR: 91.29 mL/min (ref >60.00)
Glucose, Bld: 99 mg/dL (ref 70–99)
Potassium: 4.1 meq/L (ref 3.5–5.1)
Sodium: 143 meq/L (ref 135–145)

## 2023-04-12 LAB — TESTOSTERONE: Testosterone: 877.58 ng/dL (ref 300.00–890.00)

## 2023-04-12 LAB — HEMOGLOBIN A1C: Hgb A1c MFr Bld: 5.8 % (ref 4.6–6.5)

## 2023-04-12 LAB — TSH: TSH: 1.5 u[IU]/mL (ref 0.35–5.50)

## 2023-04-12 LAB — PSA: PSA: 0.01 ng/mL — ABNORMAL LOW (ref 0.10–4.00)

## 2023-04-12 NOTE — Progress Notes (Signed)
   Subjective:    Patient ID: [name], male    DOB: [birth year], 63 y.o.   MRN: [id number]  HPI Here for labs in preparation for his scheduled heart ablation on 04-19-23 with Dr. Ole Holts. He feels fine.    Review of Systems  Constitutional: Negative.   Respiratory: Negative.    Cardiovascular: Negative.        Objective:   Physical Exam Constitutional:      Appearance: Normal appearance.  Cardiovascular:     Rate and Rhythm: Normal rate and regular rhythm.     Pulses: Normal pulses.     Heart sounds: Normal heart sounds.  Pulmonary:     Effort: Pulmonary effort is normal.     Breath sounds: Normal breath sounds.  Neurological:     Mental Status: He is alert.           Assessment & Plan:  Paroxysmal atrial fibrillation. We will get labs including CBC and BMET.  Garnette Olmsted, MD

## 2023-04-13 LAB — ESTRADIOL: Estradiol: <15 pg/mL (ref <=39)

## 2023-04-18 NOTE — Pre-Procedure Instructions (Signed)
Attempted to call patient regarding procedure instructions.  Left voicemail on the following items: Arrival time 515 Nothing to eat or drink after midnight No meds AM of procedure Responsible person to drive you home and stay with you for 24 hrs  Have you missed any doses of anti-coagulant Eliquis- should be taken twice a day, if you have missed any doses please let us know.  Don't take dose in the morning.

## 2023-04-19 ENCOUNTER — Ambulatory Visit (HOSPITAL_COMMUNITY): Payer: Medicare HMO | Admitting: Registered Nurse

## 2023-04-19 ENCOUNTER — Ambulatory Visit (HOSPITAL_COMMUNITY)
Admission: RE | Admit: 2023-04-19 | Discharge: 2023-04-19 | Disposition: A | Discharge status: Home / Self Care | Payer: Medicare HMO | Attending: Cardiology | Admitting: Cardiology

## 2023-04-19 ENCOUNTER — Other Ambulatory Visit (HOSPITAL_COMMUNITY): Payer: Self-pay

## 2023-04-19 ENCOUNTER — Other Ambulatory Visit: Payer: Self-pay

## 2023-04-19 ENCOUNTER — Encounter (HOSPITAL_COMMUNITY)
Admission: RE | Disposition: A | Discharge status: Home / Self Care | Payer: Medicare HMO | Source: Home / Self Care | Attending: Cardiology

## 2023-04-19 ENCOUNTER — Ambulatory Visit (HOSPITAL_BASED_OUTPATIENT_CLINIC_OR_DEPARTMENT_OTHER): Payer: Medicare HMO | Admitting: Registered Nurse

## 2023-04-19 DIAGNOSIS — I10 Essential (primary) hypertension: Secondary | ICD-10-CM | POA: Insufficient documentation

## 2023-04-19 DIAGNOSIS — I4891 Unspecified atrial fibrillation: Secondary | ICD-10-CM

## 2023-04-19 DIAGNOSIS — Z7901 Long term (current) use of anticoagulants: Secondary | ICD-10-CM | POA: Insufficient documentation

## 2023-04-19 DIAGNOSIS — G4733 Obstructive sleep apnea (adult) (pediatric): Secondary | ICD-10-CM | POA: Diagnosis not present

## 2023-04-19 DIAGNOSIS — I4819 Other persistent atrial fibrillation: Secondary | ICD-10-CM | POA: Diagnosis not present

## 2023-04-19 DIAGNOSIS — Z8546 Personal history of malignant neoplasm of prostate: Secondary | ICD-10-CM | POA: Insufficient documentation

## 2023-04-19 HISTORY — PX: ATRIAL FIBRILLATION ABLATION: EP1191

## 2023-04-19 LAB — POCT ACTIVATED CLOTTING TIME: Activated Clotting Time: 268 s

## 2023-04-19 SURGERY — ATRIAL FIBRILLATION ABLATION
Anesthesia: General

## 2023-04-19 MED ORDER — SODIUM CHLORIDE 0.9 % IV SOLN: INTRAVENOUS | Status: DC

## 2023-04-19 MED ORDER — COLCHICINE 0.6 MG PO TABS
0.6000 mg | ORAL_TABLET | Freq: Two times a day (BID) | ORAL | 0 refills | Status: DC
  Filled 2023-04-19: qty 10, 5d supply, fill #0

## 2023-04-19 MED ORDER — SUGAMMADEX SODIUM 200 MG/2ML IV SOLN
INTRAVENOUS | Status: DC | PRN
  Administered 2023-04-19: 400 mg via INTRAVENOUS

## 2023-04-19 MED ORDER — APIXABAN 5 MG PO TABS
5.0000 mg | ORAL_TABLET | Freq: Two times a day (BID) | ORAL | Status: DC
  Administered 2023-04-19: 5 mg via ORAL
  Filled 2023-04-19: qty 1

## 2023-04-19 MED ORDER — HEPARIN SODIUM (PORCINE) 1000 UNIT/ML IJ SOLN
INTRAMUSCULAR | Status: DC | PRN
  Administered 2023-04-19: 7000 [IU] via INTRAVENOUS
  Administered 2023-04-19: 16000 [IU] via INTRAVENOUS

## 2023-04-19 MED ORDER — ONDANSETRON HCL 4 MG/2ML IJ SOLN: 4.0000 mg | Freq: Four times a day (QID) | INTRAMUSCULAR | Status: DC | PRN

## 2023-04-19 MED ORDER — SODIUM CHLORIDE 0.9 % IV SOLN: 250.0000 mL | INTRAVENOUS | Status: DC | PRN

## 2023-04-19 MED ORDER — EPHEDRINE SULFATE-NACL 50-0.9 MG/10ML-% IV SOSY
PREFILLED_SYRINGE | INTRAVENOUS | Status: DC | PRN
  Administered 2023-04-19: 5 mg via INTRAVENOUS
  Administered 2023-04-19: 10 mg via INTRAVENOUS

## 2023-04-19 MED ORDER — ONDANSETRON HCL 4 MG/2ML IJ SOLN
INTRAMUSCULAR | Status: DC | PRN
  Administered 2023-04-19: 4 mg via INTRAVENOUS

## 2023-04-19 MED ORDER — ROCURONIUM BROMIDE 10 MG/ML (PF) SYRINGE
PREFILLED_SYRINGE | INTRAVENOUS | Status: DC | PRN
  Administered 2023-04-19: 80 mg via INTRAVENOUS
  Administered 2023-04-19: 20 mg via INTRAVENOUS

## 2023-04-19 MED ORDER — PHENYLEPHRINE 80 MCG/ML (10ML) SYRINGE FOR IV PUSH (FOR BLOOD PRESSURE SUPPORT)
PREFILLED_SYRINGE | INTRAVENOUS | Status: DC | PRN
  Administered 2023-04-19: 160 ug via INTRAVENOUS
  Administered 2023-04-19: 120 ug via INTRAVENOUS
  Administered 2023-04-19: 80 ug via INTRAVENOUS
  Administered 2023-04-19: 160 ug via INTRAVENOUS

## 2023-04-19 MED ORDER — MIDAZOLAM HCL 2 MG/2ML IJ SOLN
INTRAMUSCULAR | Status: DC | PRN
  Administered 2023-04-19: 2 mg via INTRAVENOUS

## 2023-04-19 MED ORDER — FENTANYL CITRATE (PF) 250 MCG/5ML IJ SOLN
INTRAMUSCULAR | Status: DC | PRN
  Administered 2023-04-19: 100 ug via INTRAVENOUS

## 2023-04-19 MED ORDER — SODIUM CHLORIDE 0.9% FLUSH: 3.0000 mL | INTRAVENOUS | Status: DC | PRN

## 2023-04-19 MED ORDER — PROPOFOL 10 MG/ML IV BOLUS
INTRAVENOUS | Status: DC | PRN
  Administered 2023-04-19: 150 mg via INTRAVENOUS

## 2023-04-19 MED ORDER — PANTOPRAZOLE SODIUM 40 MG PO TBEC
40.0000 mg | DELAYED_RELEASE_TABLET | Freq: Every day | ORAL | 0 refills | Status: DC
  Filled 2023-04-19: qty 45, 45d supply, fill #0

## 2023-04-19 MED ORDER — PANTOPRAZOLE SODIUM 40 MG PO TBEC
40.0000 mg | DELAYED_RELEASE_TABLET | Freq: Every day | ORAL | Status: DC
  Administered 2023-04-19: 40 mg via ORAL
  Filled 2023-04-19: qty 1

## 2023-04-19 MED ORDER — COLCHICINE 0.6 MG PO TABS
0.6000 mg | ORAL_TABLET | Freq: Two times a day (BID) | ORAL | Status: DC
Start: 2023-04-19 — End: 2023-04-19
  Administered 2023-04-19: 0.6 mg via ORAL
  Filled 2023-04-19: qty 1

## 2023-04-19 MED ORDER — SODIUM CHLORIDE 0.9% FLUSH: 3.0000 mL | Freq: Two times a day (BID) | INTRAVENOUS | Status: DC

## 2023-04-19 MED ORDER — FENTANYL CITRATE (PF) 100 MCG/2ML IJ SOLN
INTRAMUSCULAR | Status: AC
Start: 2023-04-19 — End: ?
  Filled 2023-04-19: qty 2

## 2023-04-19 MED ORDER — PROTAMINE SULFATE 10 MG/ML IV SOLN
INTRAVENOUS | Status: DC | PRN
  Administered 2023-04-19: 30 mg via INTRAVENOUS
  Administered 2023-04-19: 5 mg via INTRAVENOUS

## 2023-04-19 MED ORDER — ATROPINE SULFATE 1 MG/ML IV SOLN
INTRAVENOUS | Status: DC | PRN
  Administered 2023-04-19: 1 mg via INTRAVENOUS

## 2023-04-19 MED ORDER — PHENYLEPHRINE HCL-NACL 20-0.9 MG/250ML-% IV SOLN
INTRAVENOUS | Status: DC | PRN
  Administered 2023-04-19: 40 ug/min via INTRAVENOUS

## 2023-04-19 MED ORDER — MIDAZOLAM HCL 2 MG/2ML IJ SOLN
INTRAMUSCULAR | Status: AC
  Filled 2023-04-19: qty 2

## 2023-04-19 MED ORDER — HEPARIN (PORCINE) IN NACL 1000-0.9 UT/500ML-% IV SOLN
INTRAVENOUS | Status: DC | PRN
  Administered 2023-04-19 (×3): 500 mL

## 2023-04-19 MED ORDER — LIDOCAINE 2% (20 MG/ML) 5 ML SYRINGE
INTRAMUSCULAR | Status: DC | PRN
  Administered 2023-04-19: 100 mg via INTRAVENOUS

## 2023-04-19 SURGICAL SUPPLY — 19 items
BAG SNAP BAND KOVER 36X36 (MISCELLANEOUS) ×1
CABLE PFA RX CATH CONN (CABLE) ×1
CATH 8FR REPROCESSED SOUNDSTAR (CATHETERS) ×1 IMPLANT
CATH FARAWAVE ABLATION 31 (CATHETERS) ×1
CATH OCTARAY 2.0 F 3-3-3-3-3 (CATHETERS) ×1
CATH WEBSTER BI DIR CS D-F CRV (CATHETERS) ×1
CLOSURE PERCLOSE PROSTYLE (VASCULAR PRODUCTS) ×4
COVER SWIFTLINK CONNECTOR (BAG) ×1
DILATOR VESSEL 38 20CM 16FR (INTRODUCER) ×1
INQWIRE 1.5J .035X260CM (WIRE) ×1 IMPLANT
KIT VERSACROSS CNCT FARADRIVE (KITS) ×1
MAT PREVALON FULL STRYKER (MISCELLANEOUS) ×1
PACK EP LF (CUSTOM PROCEDURE TRAY) ×1
PAD DEFIB RADIO PHYSIO CONN (PAD) ×1
PATCH CARTO3 (PAD) ×1
SHEATH FARADRIVE STEERABLE (SHEATH) ×1
SHEATH PINNACLE 8F 10CM (SHEATH) ×2
SHEATH PINNACLE 9F 10CM (SHEATH) ×1
SHEATH PROBE COVER 6X72 (BAG) ×1

## 2023-04-19 NOTE — Anesthesia Procedure Notes (Signed)
Procedure Name: Intubation Date/Time: 04/19/2023 7:53 AM  Performed by: Loleta Dayrin Stallone, CRNAPre-anesthesia Checklist: Patient identified, Patient being monitored, Timeout performed, Emergency Drugs available and Suction available Patient Re-evaluated:Patient Re-evaluated prior to induction Oxygen Delivery Method: Circle system utilized Preoxygenation: Pre-oxygenation with 100% oxygen Induction Type: IV induction Ventilation: Mask ventilation without difficulty Laryngoscope Size: Glidescope and 4 Grade View: Grade I Tube type: Oral Tube size: 7.5 mm Number of attempts: 1 Airway Equipment and Method: Stylet Placement Confirmation: ETT inserted through vocal cords under direct vision, positive ETCO2 and breath sounds checked- equal and bilateral Secured at: 23 cm Tube secured with: Tape Dental Injury: Teeth and Oropharynx as per pre-operative assessment

## 2023-04-19 NOTE — Transfer of Care (Signed)
Immediate Anesthesia Transfer of Care Note  Patient: [name]  Procedure(s) Performed: ATRIAL FIBRILLATION ABLATION  Patient Location: PACU and Cath Lab  Anesthesia Type:General  Level of Consciousness: drowsy, patient cooperative, and responds to stimulation  Airway & Oxygen Therapy: Patient Spontanous Breathing and Patient connected to nasal cannula oxygen  Post-op Assessment: Report given to RN, Post -op Vital signs reviewed and stable, and Patient moving all extremities X 4  Post vital signs: Reviewed and stable  Last Vitals:  Vitals Value Taken Time  BP 105/59 04/19/23 0935  Temp 36.7 C 04/19/23 0937  Pulse 91 04/19/23 0938  Resp 17 04/19/23 0938  SpO2 96 % 04/19/23 0938  Vitals shown include unfiled device data.  Last Pain:  Vitals:   04/19/23 0937  TempSrc: Temporal  PainSc: 0-No pain         Complications: No notable events documented.

## 2023-04-19 NOTE — Anesthesia Preprocedure Evaluation (Addendum)
Anesthesia Evaluation  Patient identified by MRN, date of birth, ID band Patient awake    Reviewed: Allergy & Precautions, NPO status , Patient's Chart, lab work & pertinent test results, reviewed documented beta blocker date and time   History of Anesthesia Complications Negative for: history of anesthetic complications  Airway Mallampati: III  TM Distance: >3 FB Neck ROM: Limited   Comment: Cervical fusion Dental no notable dental hx.    Pulmonary sleep apnea and Continuous Positive Airway Pressure Ventilation , neg COPD   breath sounds clear to auscultation       Cardiovascular hypertension, (-) CAD, (-) Past MI and (-) Cardiac Stents + dysrhythmias Atrial Fibrillation  Rhythm:Regular Rate:Normal     Neuro/Psych neg Seizures PSYCHIATRIC DISORDERS  Depression       GI/Hepatic ,GERD  ,,(+) neg Cirrhosis        Endo/Other    Renal/GU Renal disease     Musculoskeletal  (+) Arthritis ,    Abdominal   Peds  Hematology   Anesthesia Other Findings   Reproductive/Obstetrics                             Anesthesia Physical Anesthesia Plan  ASA: 2  Anesthesia Plan: General   Post-op Pain Management:    Induction: Intravenous  PONV Risk Score and Plan: 2 and Ondansetron and Dexamethasone  Airway Management Planned: Oral ETT and Video Laryngoscope Planned  Additional Equipment:   Intra-op Plan:   Post-operative Plan: Extubation in OR  Informed Consent: I have reviewed the patients History and Physical, chart, labs and discussed the procedure including the risks, benefits and alternatives for the proposed anesthesia with the patient or authorized representative who has indicated his/her understanding and acceptance.     Dental advisory given  Plan Discussed with: CRNA  Anesthesia Plan Comments:        Anesthesia Quick Evaluation

## 2023-04-19 NOTE — Progress Notes (Signed)
Pt ambulated to and from bathroom to void with no signs of oozing from bilateral groin sites

## 2023-04-19 NOTE — H&P (Signed)
Electrophysiology Office Note:     Date:  04/19/2023    ID:  [name], DOB 03-24-1960, MRN [id number]   [facility] Cardiologist:  Kristeen Miss, MD  [facility] Electrophysiologist:  Lanier Prude, MD    Referring MD: Nelwyn Salisbury, MD    Chief Complaint: Atrial fibrillation   History of Present Illness:       Mr. [name] is a 63 year old man who I am seeing today for an evaluation of atrial fibrillation at the request of Dr. Royann Shivers.  The patient has a history of prostate cancer, hypertension, anemia, obstructive sleep apnea and atrial fibrillation.  The patient was admitted to the hospital in November of this year.  At the time of presentation he was in atrial fibrillation with rapid ventricular rates.  He was temporarily on Eliquis but due to a low CHA2DS2-VASc score, the Eliquis was ultimately discontinued.  He has had GI bleeding in the past due to H. pylori infection with diverticulitis.   The episode of atrial fibrillation in November of this year was highly symptomatic with rates in the 150s. Eliquis was restarted during the hospitalization.  He was referred to discuss possible ablation for his atrial fibrillation.   Today he confirms the above history.  He tells me that his atrial fibrillation was diagnosed first in 2021 at the time of high caffeine intake.  He had another episode in 2022 when drinking ice water.  He has had at least 2 episodes in 2024, 1 while driving to the [place] and another occurred while at rest and was shorter lasting.  He exercises daily at the gym.  He thinks he may have had an episode in the past after steroid injection.  Rates during A-fib in the past have been as high as 170s to 180s. Objective Their past medical, social and family history was reveiwed.  Presents for AF ablation today. Procedure reviewed.     ROS:   Please see the history of present illness.    All other systems reviewed and are negative.   EKGs/Labs/Other  Studies Reviewed:     The following studies were reviewed today:   October 05, 2019 echo shows a EF of 55% RV normal No significant valvular disease   January 15, 2023 EKG shows atrial fibrillation with rapid ventricular rate          Physical Exam:     VS:  BP 139/91 (BP Location: Right Arm, Patient Position: Sitting)   Pulse 87   Ht 5\' 10"  (1.778 m)   Wt 237 lb 9.6 oz (107.8 kg)   SpO2 97%   BMI 34.09 kg/m         Wt Readings from Last 3 Encounters:  02/21/23 237 lb 9.6 oz (107.8 kg)  01/22/23 235 lb (106.6 kg)  01/16/23 255 lb (115.7 kg)      GEN: no distress CARD: RRR, No MRG RESP: No IWOB. CTAB.         Assessment ASSESSMENT AND PLAN:     1. Persistent atrial fibrillation (HCC)   2. Essential hypertension       #Persistent atrial fibrillation Symptomatic.  On Eliquis for stroke prophylaxis.  He has a CHA2DS2-VASc of 1 only for hypertension.  Treatment with Eliquis has been complicated by bleeding in the past.  Watchman cannot be considered with a CHA2DS2-VASc of 1.   I discussed treatment options for his atrial fibrillation at length during today's visit.  We discussed antiarrhythmic drug therapy and catheter  ablation.  He would like to proceed with catheter ablation.   Discussed treatment options today for AF including antiarrhythmic drug therapy and ablation. Discussed risks, recovery and likelihood of success with each treatment strategy. Risk, benefits, and alternatives to EP study and ablation for afib were discussed. These risks include but are not limited to stroke, bleeding, vascular damage, tamponade, perforation, damage to the esophagus, lungs, phrenic nerve and other structures, pulmonary vein stenosis, worsening renal function, coronary vasospasm and death.  Discussed potential need for repeat ablation procedures and antiarrhythmic drugs after an initial ablation. The patient understands these risk and wishes to proceed.  We will therefore proceed with  catheter ablation at the next available time.  Carto, ICE, anesthesia are requested for the procedure.  Will also obtain CT PV protocol prior to the procedure to exclude LAA thrombus and further evaluate atrial anatomy.     #Hypertension At goal today.  Recommend checking blood pressures 1-2 times per week at home and recording the values.  Recommend bringing these recordings to the primary care physician.     Presents for AF ablation today. Procedure reviewed.     Signed, Rossie Muskrat. Lalla Brothers, MD, [facility], [facility] 04/19/2023 Electrophysiology [facility]

## 2023-04-19 NOTE — Discharge Instructions (Signed)

## 2023-04-19 NOTE — Anesthesia Postprocedure Evaluation (Signed)
Anesthesia Post Note  Patient: MARVON SHILLINGBURG  Procedure(s) Performed: ATRIAL FIBRILLATION ABLATION     Patient location during evaluation: PACU Anesthesia Type: General Level of consciousness: awake and alert Pain management: pain level controlled Vital Signs Assessment: post-procedure vital signs reviewed and stable Respiratory status: spontaneous breathing, nonlabored ventilation, respiratory function stable and patient connected to nasal cannula oxygen Cardiovascular status: blood pressure returned to baseline and stable Postop Assessment: no apparent nausea or vomiting Anesthetic complications: no   No notable events documented.  Last Vitals:  Vitals:   04/19/23 1035 04/19/23 1045  BP: 93/61 (!) 119/96  Pulse: 81 97  Resp: 13 (!) 23  Temp:    SpO2: 95% 95%    Last Pain:  Vitals:   04/19/23 1035  TempSrc:   PainSc: 0-No pain                 Mariann Barter

## 2023-04-22 ENCOUNTER — Telehealth (HOSPITAL_COMMUNITY): Payer: Self-pay

## 2023-04-22 ENCOUNTER — Other Ambulatory Visit (HOSPITAL_COMMUNITY): Payer: Self-pay

## 2023-04-22 ENCOUNTER — Encounter (HOSPITAL_COMMUNITY): Payer: Self-pay | Admitting: Cardiology

## 2023-04-22 NOTE — Telephone Encounter (Signed)
 Attempted to reach patient to follow up with procedure completed on 04/19/23, no answer. Left VM for patient to return call.

## 2023-04-23 DIAGNOSIS — Z0100 Encounter for examination of eyes and vision without abnormal findings: Secondary | ICD-10-CM | POA: Diagnosis not present

## 2023-04-23 MED FILL — Fentanyl Citrate Preservative Free (PF) Inj 100 MCG/2ML: INTRAMUSCULAR | Qty: 2 | Status: AC

## 2023-04-23 NOTE — Telephone Encounter (Signed)
 Spoke with patient to complete post procedure follow up call.  Patient reports no complications with groin sites. He removed large bandage at puncture sites after 24 hours.  Instructions reviewed with patient:  It is normal to have bruising, tenderness and a pea or marble sized lump/knot at the groin site which can take up to three months to resolve.  Get help right away if you notice sudden swelling at the puncture site.  Check your puncture site every day for signs of infection: fever, redness, swelling, pus drainage, warmth, foul odor or excessive pain. If this occurs, please call the office at [phone], to speak with the nurse. Get help right away if your puncture site is bleeding and the bleeding does not stop after applying firm pressure to the area.  You may continue to have skipped beats/ atrial fibrillation during the first several months after your procedure.  It is very important not to miss any doses of your blood thinner ELIQUIS. Patient restarted taking this medication on 04/19/23.   You will follow up with the Afib clinic 05/17/23 and follow up with the APP on 07/17/23.    Patient verbalized understanding to all instructions provided.

## 2023-05-01 NOTE — Addendum Note (Signed)
 Addended by: Johnella Moloney on: 05/01/2023 02:23 PM   Modules accepted: Orders

## 2023-05-02 ENCOUNTER — Encounter: Payer: Self-pay | Admitting: Emergency Medicine

## 2023-05-17 ENCOUNTER — Encounter (HOSPITAL_COMMUNITY): Payer: Self-pay | Admitting: Physician Assistant

## 2023-05-17 ENCOUNTER — Other Ambulatory Visit (HOSPITAL_COMMUNITY): Payer: Self-pay

## 2023-05-17 ENCOUNTER — Ambulatory Visit (HOSPITAL_COMMUNITY)
Admission: RE | Admit: 2023-05-17 | Discharge: 2023-05-17 | Disposition: A | Discharge status: Home / Self Care | Payer: Medicare HMO | Source: Ambulatory Visit | Attending: Physician Assistant | Admitting: Physician Assistant

## 2023-05-17 VITALS — BP 134/92 | HR 79 | Ht 70.0 in | Wt 245.2 lb

## 2023-05-17 DIAGNOSIS — I4819 Other persistent atrial fibrillation: Secondary | ICD-10-CM | POA: Diagnosis not present

## 2023-05-17 DIAGNOSIS — Z6835 Body mass index (BMI) 35.0-35.9, adult: Secondary | ICD-10-CM | POA: Insufficient documentation

## 2023-05-17 DIAGNOSIS — E669 Obesity, unspecified: Secondary | ICD-10-CM | POA: Insufficient documentation

## 2023-05-17 DIAGNOSIS — G4733 Obstructive sleep apnea (adult) (pediatric): Secondary | ICD-10-CM | POA: Diagnosis not present

## 2023-05-17 DIAGNOSIS — I10 Essential (primary) hypertension: Secondary | ICD-10-CM | POA: Diagnosis not present

## 2023-05-17 DIAGNOSIS — Z7901 Long term (current) use of anticoagulants: Secondary | ICD-10-CM | POA: Diagnosis not present

## 2023-05-17 MED ORDER — APIXABAN 5 MG PO TABS: 5.0000 mg | ORAL_TABLET | Freq: Two times a day (BID) | ORAL | Status: DC

## 2023-05-17 NOTE — Progress Notes (Signed)
 Primary Care Physician: Nelwyn Salisbury, MD Primary Cardiologist: Dr Elease Hashimoto Primary Electrophysiologist: Dr Lalla Brothers Referring Physician: [facility]   [name] is a 63 y.o. male with a history of OSA, HTN, atrial fibrillation who presents for follow up in the [facility]. The patient was initially diagnosed with atrial fibrillation 09/2019 after presenting to the ED with URI symptoms and was noted to have an elevated heart rate. He converted to SR with diltiazem drip. He was in his usual state of health until 10/21/20 when he had sudden onset of tachypalpitations. He presented to the ED and underwent DCCV. Patient is on Eliquis for stoke prevention. He was hospitalized 01/2023 with afib with RVR. He was seen by Dr Lalla Brothers and underwent afib ablation on 04/19/23.  Patient returns for follow up for atrial fibrillation. He reports that he has done well since ablation. He remains in SR and is back to full activity. Two days ago, he felt "feverish" and had sensation of SOB. He denies any known sick contacts. No other symptoms. He will f/u with PCP if symptoms don't improve. He denies chest pain or groin issues.   Today, he denies symptoms of palpitations, chest pain, orthopnea, PND, lower extremity edema, dizziness, presyncope, syncope, bleeding, or neurologic sequela. The patient is tolerating medications without difficulties and is otherwise without complaint today.    Atrial Fibrillation Risk Factors:  he does have symptoms or diagnosis of sleep apnea. he does not have a history of rheumatic fever. he does not have a history of alcohol use. The patient does not have a history of early familial atrial fibrillation or other arrhythmias.   Atrial Fibrillation Management history:  Previous antiarrhythmic drugs: none Previous cardioversions: 10/21/20 Previous ablations: 04/19/23 Anticoagulation history: Eliquis   Past Medical History:  Diagnosis Date    Arthritis    Cancer (HCC)    prostate, sees Dr. Isabel Caprice    Colon polyps    Degenerative disc disease, cervical    Degenerative disc disease, lumbar    Depression    GERD (gastroesophageal reflux disease)    Low back pain    Osteopenia     Current Outpatient Medications  Medication Sig Dispense Refill   anastrozole (ARIMIDEX) 1 MG tablet TAKE 1 TABLET BY MOUTH EVERY DAY 90 tablet 3   apixaban (ELIQUIS) 5 MG TABS tablet Take 1 tablet (5 mg total) by mouth 2 (two) times daily. 180 tablet 3   Cholecalciferol (VITAMIN D) 125 MCG (5000 UT) CAPS Take 5,000 Units by mouth at bedtime.     diclofenac (VOLTAREN) 75 MG EC tablet Take 75 mg by mouth 2 (two) times daily.     Iron, Ferrous Sulfate, 325 (65 Fe) MG TABS Take 325 mg by mouth 2 (two) times daily. 60 tablet 11   MAGNESIUM GLYCINATE PO Take 1,000 mg by mouth at bedtime.     metoprolol succinate (TOPROL-XL) 25 MG 24 hr tablet Take 1 tablet (25 mg total) by mouth daily. 90 tablet 0   telmisartan (MICARDIS) 40 MG tablet Take 40 mg by mouth daily.     testosterone cypionate (DEPOTESTOSTERONE CYPIONATE) 200 MG/ML injection Inject 1 mL (200 mg total) into the muscle every 7 (seven) days. 10 mL 5   traMADol (ULTRAM) 50 MG tablet TAKE 2 TABLETS EVERY 6 HOURS AS NEEDED FOR PAIN 240 tablet 2   No current facility-administered medications for this encounter.    ROS- All systems are reviewed and negative except as per  the HPI above.  Physical Exam: Vitals:   05/17/23 1016  BP: (!) 134/92  Pulse: 79  Weight: 111.2 kg  Height: 5\' 10"  (1.778 m)     GEN: Well nourished, well developed in no acute distress CARDIAC: Regular rate and rhythm, no murmurs, rubs, gallops RESPIRATORY:  Clear to auscultation without rales, wheezing or rhonchi  ABDOMEN: Soft, non-tender, non-distended EXTREMITIES:  No edema; No deformity    Wt Readings from Last 3 Encounters:  05/17/23 111.2 kg  04/19/23 105.2 kg  04/12/23 108.9 kg    EKG today demonstrates   SR Vent. rate 79 BPM PR interval 164 ms QRS duration 108 ms QT/QTcB 368/421 ms   Echo 10/05/19 demonstrated  1. Left ventricular ejection fraction, by estimation, is 55%. The left  ventricle has normal function. The left ventricle has no regional wall  motion abnormalities. There is mild left ventricular hypertrophy. Left  ventricular diastolic parameters are consistent with Grade I diastolic dysfunction (impaired relaxation).   2. Right ventricular systolic function is normal. The right ventricular  size is normal. Tricuspid regurgitation signal is inadequate for assessing PA pressure.   3. The mitral valve is normal in structure. No evidence of mitral valve  regurgitation. No evidence of mitral stenosis.   4. The aortic valve is tricuspid. Aortic valve regurgitation is not  visualized. No aortic stenosis is present.   5. Aortic dilatation noted. There is mild dilatation of the ascending  aorta measuring 39 mm.   6. The inferior vena cava is normal in size with greater than 50%  respiratory variability, suggesting right atrial pressure of 3 mmHg.   Epic records are reviewed at length today  CHA2DS2-VASc Score = 1  The patient's score is based upon: CHF History: 0 HTN History: 1 Diabetes History: 0 Stroke History: 0 Vascular Disease History: 0 Age Score: 0 Gender Score: 0       ASSESSMENT AND PLAN: Persistent Atrial Fibrillation (ICD10:  I48.19) The patient's CHA2DS2-VASc score is 1, indicating a 0.6% annual risk of stroke.   S/p afib ablation 04/19/23 Patient appears to be maintaining SR Continue Eliquis 5 mg BID with no missed doses for 3 months post ablation.  Continue Toprol 25 mg daily  Obesity Body mass index is 35.18 kg/m.  Encouraged lifestyle modification  OSA  Encouraged nightly CPAP  HTN Stable on current regimen   Follow up with Otilio Saber as scheduled.    Jorja Loa PA-C [facility] [address] [place],  [place] [address] [phone] 05/17/2023 10:26 AM

## 2023-05-31 ENCOUNTER — Encounter: Payer: Self-pay | Admitting: Family Medicine

## 2023-05-31 ENCOUNTER — Ambulatory Visit (INDEPENDENT_AMBULATORY_CARE_PROVIDER_SITE_OTHER): Admitting: Family Medicine

## 2023-05-31 VITALS — BP 130/74 | HR 83 | Temp 98.2°F | Wt 243.3 lb

## 2023-05-31 DIAGNOSIS — I10 Essential (primary) hypertension: Secondary | ICD-10-CM

## 2023-05-31 NOTE — Progress Notes (Signed)
 Established Patient Office Visit  Subjective   Patient ID: [name], male    DOB: Aug 06, 1960  Age: 63 y.o. MRN: [id number]  Chief Complaint  Patient presents with   Hypertension   Headache   Dizziness    HPI    [name] is seen with concern for some recent elevated blood pressures.  He has chronic problems including atrial fibrillation, hypertension, obstructive sleep apnea, hypogonadism, history of prostate cancer.  He has been using his CPAP regularly.  Currently takes telmisartan 40 mg daily and metoprolol XL 25 mg daily.  Recent ablation procedure for A-fib which worked well.  Has had several home readings recently over 140s systolic.  He does have some concern whether his current blood pressure cuff may be too small for his arm.  Former Pharmacist, community.  He has also gained some weight during this past winter which he knows may be contributing.  He eats very clean and watches his sodium intake carefully.  No alcohol.  Exercises regularly 4 days/week with combination of resistance training and some aerobic with walking.  No evidence for recurrent A-fib since his ablation procedure.    Past Medical History:  Diagnosis Date   Arthritis    Cancer [facility])    prostate, sees Dr. Isabel Caprice    Colon polyps    Degenerative disc disease, cervical    Degenerative disc disease, lumbar    Depression    GERD (gastroesophageal reflux disease)    Low back pain    Osteopenia    Past Surgical History:  Procedure Laterality Date   ATRIAL FIBRILLATION ABLATION N/A 04/19/2023   Procedure: ATRIAL FIBRILLATION ABLATION;  Surgeon: Lanier Prude, MD;  Location: [facility] INVASIVE CV LAB;  Service: Cardiovascular;  Laterality: N/A;   CERVICAL FUSION  2015   C3 through C7, per Dr. Wyline Mood    COLONOSCOPY  10/18/2022   per Dr. Levora Angel, clear, repeat in 5 yrs (has a hx of adenomatous polyps)   CYSTECTOMY     benign, right hip   ESOPHAGOGASTRODUODENOSCOPY  10/18/2022   per Dr. Levora Angel, showed H  pylori and Candida   GYNECOMASTIA EXCISION     bilateral, reduction   HIP SURGERY Right    12/2020   JOINT REPLACEMENT  12/20/2011   hx. LTHA, now RTHA planned   LAMINECTOMY  04/2007   lumbar   LUMBAR FUSION  2015   L4 through S1, per Dr. Wyline Mood    LUMBAR MICRODISCECTOMY  10/15/2008   L-4-5, per Dr. Donette Larry at [facility]   PROSTATE SURGERY  12/13/2010   robotic prostatectomy per Dr. Isabel Caprice    TOTAL HIP ARTHROPLASTY  12/20/2011   2010 left hip   TOTAL HIP ARTHROPLASTY  12/26/2011   Procedure: TOTAL HIP ARTHROPLASTY;  Surgeon: Loanne Drilling, MD;  Location: [facility] ORS;  Service: Orthopedics;  Laterality: Right;    reports that he has never smoked. He has never used smokeless tobacco. He reports that he does not drink alcohol and does not use drugs. family history includes Arthritis in an other family member; Cancer in his brother; Healthy in his mother; Hypertension in his father; Suicidality in his father. Allergies  Allergen Reactions   Flexeril [Cyclobenzaprine]     Weird feeling   Oxycodone-Acetaminophen Nausea Only and Other (See Comments)    dizziness Weird feeling;  Hydrocodone on home med list 7/24    Review of Systems  Constitutional:  Negative for malaise/fatigue.  Eyes:  Negative for blurred vision.  Respiratory:  Negative for shortness of breath.   Cardiovascular:  Negative for chest pain.  Neurological:  Negative for dizziness, weakness and headaches.      Objective:     BP 130/74 (BP Location: Left Arm, Patient Position: Sitting, Cuff Size: Large)   Pulse 83   Temp 98.2 F (36.8 C) (Oral)   Wt 243 lb 4.8 oz (110.4 kg)   SpO2 96%   BMI 34.91 kg/m  BP Readings from Last 3 Encounters:  05/31/23 130/74  05/17/23 (!) 134/92  04/19/23 (!) 109/99   Wt Readings from Last 3 Encounters:  05/31/23 243 lb 4.8 oz (110.4 kg)  05/17/23 245 lb 3.2 oz (111.2 kg)  04/19/23 232 lb (105.2 kg)      Physical Exam Vitals reviewed.  Constitutional:       General: He is not in acute distress.    Appearance: He is not ill-appearing.  Cardiovascular:     Rate and Rhythm: Normal rate and regular rhythm.  Pulmonary:     Effort: Pulmonary effort is normal.     Breath sounds: Normal breath sounds. No wheezing or rales.  Neurological:     Mental Status: He is alert.      No results found for any visits on 05/31/23.  Last CBC Lab Results  Component Value Date   WBC 4.3 04/12/2023   HGB 15.3 04/12/2023   HCT 47.9 04/12/2023   MCV 72.0 (L) 04/12/2023   MCH 22.2 (L) 03/05/2023   RDW 21.3 (H) 04/12/2023   PLT 223.0 04/12/2023   Last metabolic panel Lab Results  Component Value Date   GLUCOSE 99 04/12/2023   NA 143 04/12/2023   K 4.1 04/12/2023   CL 102 04/12/2023   CO2 29 04/12/2023   BUN 13 04/12/2023   CREATININE 0.91 04/12/2023   GFR 91.29 04/12/2023   CALCIUM 9.4 04/12/2023   PHOS 1.9 (L) 09/08/2019   PROT 7.1 04/12/2023   ALBUMIN 4.5 04/12/2023   BILITOT 1.0 04/12/2023   ALKPHOS 81 04/12/2023   AST 54 (H) 04/12/2023   ALT 81 (H) 04/12/2023   ANIONGAP 7 01/16/2023   Last lipids Lab Results  Component Value Date   CHOL 220 (H) 04/12/2023   HDL 40.90 04/12/2023   LDLCALC 150 (H) 04/12/2023   LDLDIRECT 138.8 08/17/2009   TRIG 143.0 04/12/2023   CHOLHDL 5 04/12/2023   Last hemoglobin A1c Lab Results  Component Value Date   HGBA1C 5.8 04/12/2023   Last thyroid functions Lab Results  Component Value Date   TSH 1.50 04/12/2023      The 10-year ASCVD risk score (Arnett DK, et al., 2019) is: 13.9%    Assessment & Plan:   Hypertension.  Has had some recent elevated home readings.  We did check a couple times with our cuff here and consistently got about 10 points higher with his cuff.  Suspect his cuff size is small for his arm.  Repeat reading left arm seated with large cuff here 124/78.  We suggested the following:  -Consider new home blood pressure cuff perhaps with larger cuff size. -Continue  telmisartan 40 mg daily -Continue to watch sodium intake closely and try to keep daily sodium intake less than 2000 mg.  Handout on DASH diet given -We did mention trying to lose a few pounds which may bring his blood pressure in better alignment -Continue regular exercise habits -If home blood pressures with new cuff consistently over 140 systolic could consider bumping his telmisartan up to 80 mg  Evelena Peat, MD

## 2023-05-31 NOTE — Patient Instructions (Signed)
 Try to lose a few pounds- I think this will help the BP.  Let me know if BP consistently > 140 top number and we can increase the Telmisartan to 80 mg .    Consider new monitor with larger cuff

## 2023-06-05 ENCOUNTER — Ambulatory Visit: Payer: Self-pay

## 2023-06-05 NOTE — Telephone Encounter (Signed)
  Chief Complaint: high blood pressure readings Symptoms: 160/95, 161/91 last night. Current readings from today 147/84, 142/86 Frequency: last three weeks Pertinent Negatives: Patient denies CP, SOB, headache, weakness Disposition: [] ED /[] Urgent Care (no appt availability in office) / [x] Appointment(In office/virtual)/ []  [facility]/ [] Home Care/ [] Refused Recommended Disposition /[] [facility]/ []  Follow-up with PCP Additional Notes: patient calling with concerns for high blood pressure readings. Patient was seen recently by another provider in office for high BP readings. Patient was instructed to get a larger blood pressure cuff as his previous readings were possible due to that. Patient reports he bought a brand new machine along with the largest cuff that he could find. Reports readings with SBPs in the 160s last night. SBP readings in the 140s today. Patient denies CP, SOB, headache and weakness currently but does endorse that he had tingling in his head with the readings from last night.  Patient is requesting to make an appointment to see his PCP. Patient is recommended to be seen within 2 weeks per protocol. Patient is scheduled for an appointment with PCP for June 11, 2023 at 2:00 PM. Patient given strict instructions to call back if he develops CP, SOB, headache, or weakness along with elevated BP readings. Patient verbalized understanding of plan and all questions answered.    Copied from CRM [id number]. Topic: Clinical - Red Word Triage >> Jun 05, 2023 12:25 PM Morrie Sheldon D wrote: Reason for MWU:XLKGM pressure elevated for three weeks and he has been monitoring it. Patient states he feels it in hs head like tingling Reason for Disposition  [1] Systolic BP  >= 130 OR Diastolic >= 80 AND [2] taking BP medications  Answer Assessment - Initial Assessment Questions 1. BLOOD PRESSURE: "What is the blood pressure?" "Did you take at least two measurements 5 minutes apart?"      160/95, 161/91, 147/84, 142/86 2. ONSET: "When did you take your blood pressure?"     Took this morning 3. HOW: "How did you take your blood pressure?" (e.g., automatic home BP monitor, visiting nurse)     Automatic home BP monitor-bought a new machine 4. HISTORY: "Do you have a history of high blood pressure?"     Yes 5. MEDICINES: "Are you taking any medicines for blood pressure?" "Have you missed any doses recently?"     Metoprolol, telmisartan 6. OTHER SYMPTOMS: "Do you have any symptoms?" (e.g., blurred vision, chest pain, difficulty breathing, headache, weakness)     Asymptomatic currently-was having tingling in his head last night  Protocols used: Blood Pressure - High-A-AH

## 2023-06-05 NOTE — Telephone Encounter (Signed)
 FYI  Pt has appointment on 06/11/23

## 2023-06-11 ENCOUNTER — Encounter: Payer: Self-pay | Admitting: Family Medicine

## 2023-06-11 ENCOUNTER — Ambulatory Visit (INDEPENDENT_AMBULATORY_CARE_PROVIDER_SITE_OTHER): Admitting: Family Medicine

## 2023-06-11 VITALS — BP 124/68 | HR 82 | Temp 98.2°F | Wt 241.0 lb

## 2023-06-11 DIAGNOSIS — I4891 Unspecified atrial fibrillation: Secondary | ICD-10-CM

## 2023-06-11 DIAGNOSIS — I10 Essential (primary) hypertension: Secondary | ICD-10-CM

## 2023-06-11 LAB — CBC WITH DIFFERENTIAL/PLATELET
Basophils Absolute: 0.1 10*3/uL (ref 0.0–0.1)
Basophils Relative: 1.2 % (ref 0.0–3.0)
Eosinophils Absolute: 0.1 10*3/uL (ref 0.0–0.7)
Eosinophils Relative: 2.5 % (ref 0.0–5.0)
HCT: 48.3 % (ref 39.0–52.0)
Hemoglobin: 15.6 g/dL (ref 13.0–17.0)
Lymphocytes Relative: 16.5 % (ref 12.0–46.0)
Lymphs Abs: 0.8 10*3/uL (ref 0.7–4.0)
MCHC: 32.3 g/dL (ref 30.0–36.0)
MCV: 74.4 fl — ABNORMAL LOW (ref 78.0–100.0)
Monocytes Absolute: 0.7 10*3/uL (ref 0.1–1.0)
Monocytes Relative: 14.4 % — ABNORMAL HIGH (ref 3.0–12.0)
Neutro Abs: 3.4 10*3/uL (ref 1.4–7.7)
Neutrophils Relative %: 65.4 % (ref 43.0–77.0)
Platelets: 237 10*3/uL (ref 150.0–400.0)
RBC: 6.5 Mil/uL — ABNORMAL HIGH (ref 4.22–5.81)
RDW: 20 % — ABNORMAL HIGH (ref 11.5–15.5)
WBC: 5.1 10*3/uL (ref 4.0–10.5)

## 2023-06-11 LAB — BASIC METABOLIC PANEL WITH GFR
BUN: 21 mg/dL (ref 6–23)
CO2: 28 meq/L (ref 19–32)
Calcium: 9.8 mg/dL (ref 8.4–10.5)
Chloride: 99 meq/L (ref 96–112)
Creatinine, Ser: 0.91 mg/dL (ref 0.40–1.50)
GFR: 91.19 mL/min (ref >60.00)
Glucose, Bld: 91 mg/dL (ref 70–99)
Potassium: 4 meq/L (ref 3.5–5.1)
Sodium: 138 meq/L (ref 135–145)

## 2023-06-11 LAB — HEPATIC FUNCTION PANEL
ALT: 43 U/L (ref 0–53)
AST: 39 U/L — ABNORMAL HIGH (ref 0–37)
Albumin: 4.9 g/dL (ref 3.5–5.2)
Alkaline Phosphatase: 94 U/L (ref 39–117)
Bilirubin, Direct: 0.1 mg/dL (ref 0.0–0.3)
Total Bilirubin: 0.5 mg/dL (ref 0.2–1.2)
Total Protein: 7.5 g/dL (ref 6.0–8.3)

## 2023-06-11 LAB — VITAMIN B12: Vitamin B-12: 532 pg/mL (ref 211–911)

## 2023-06-11 LAB — IBC + FERRITIN
Ferritin: 13.7 ng/mL — ABNORMAL LOW (ref 22.0–322.0)
Iron: 22 ug/dL — ABNORMAL LOW (ref 42–165)
Saturation Ratios: 4 % — ABNORMAL LOW (ref 20.0–50.0)
TIBC: 548.8 ug/dL — ABNORMAL HIGH (ref 250.0–450.0)
Transferrin: 392 mg/dL — ABNORMAL HIGH (ref 212.0–360.0)

## 2023-06-11 MED ORDER — AMLODIPINE BESYLATE 5 MG PO TABS: 5.0000 mg | ORAL_TABLET | Freq: Every day | ORAL | 3 refills | Status: DC

## 2023-06-11 NOTE — Progress Notes (Signed)
   Subjective:    Patient ID: [name], male    DOB: 09/17/1960, 63 y.o.   MRN: [id number]  HPI Here to follow up on HTN. He has been taking Metoprolol succinate and Telmisartan every morning, and he had been getting BP readings at home with systolic readings in the 150's and 160's. It was determined that his cuff was too small, so he is now using an XL cuff. The readings are a little lower, but he still gets some in the 150's systolic. He thinks that the Metoprolol is what makes him so tired all the time, and it causes some ED issues as well. He has been in NSR ever since his ablation.    Review of Systems  Constitutional:  Positive for fatigue.  Respiratory: Negative.    Cardiovascular: Negative.   Neurological: Negative.        Objective:   Physical Exam Constitutional:      Appearance: Normal appearance.  Cardiovascular:     Rate and Rhythm: Normal rate and regular rhythm.     Pulses: Normal pulses.     Heart sounds: Normal heart sounds.  Pulmonary:     Effort: Pulmonary effort is normal.     Breath sounds: Normal breath sounds.  Neurological:     Mental Status: He is alert.           Assessment & Plan:  For the HTN, we will stop the Metoprolol and start him on Amlodipine 5 mg daily. He will report back in 3-4 weeks.  Gershon Crane, MD

## 2023-06-12 ENCOUNTER — Encounter: Payer: Self-pay | Admitting: *Deleted

## 2023-06-12 DIAGNOSIS — K219 Gastro-esophageal reflux disease without esophagitis: Secondary | ICD-10-CM | POA: Diagnosis not present

## 2023-06-12 DIAGNOSIS — A048 Other specified bacterial intestinal infections: Secondary | ICD-10-CM | POA: Diagnosis not present

## 2023-06-13 ENCOUNTER — Telehealth: Payer: Self-pay

## 2023-06-13 ENCOUNTER — Other Ambulatory Visit (HOSPITAL_COMMUNITY): Payer: Self-pay

## 2023-06-13 NOTE — Telephone Encounter (Signed)
 Pharmacy Patient Advocate Encounter   Received notification from CoverMyMeds that prior authorization for Telmisartan is required/requested.   Insurance verification completed.   The patient is insured through [employer] .   Per test claim: Refill too soon. PA is not needed at this time. Medication was filled 06/09/23. Next eligible fill date is 08/16/23.

## 2023-06-24 ENCOUNTER — Other Ambulatory Visit: Payer: Self-pay | Admitting: Family Medicine

## 2023-06-24 NOTE — Telephone Encounter (Signed)
 Copied from CRM [id number]. Topic: Clinical - Medication Refill >> Jun 24, 2023  1:43 PM Howard Macho wrote: Most Recent Primary Care Visit:  Provider: Corita Diego A  Department: [facility]  Visit Type: ACUTE  Date: 06/11/2023  Medication: telmisartan  (MICARDIS ) 40 MG tablet  Has the patient contacted their pharmacy? No (Agent: If no, request that the patient contact the pharmacy for the refill. If patient does not wish to contact the pharmacy document the reason why and proceed with request.) (Agent: If yes, when and what did the pharmacy advise?)  Is this the correct pharmacy for this prescription? Yes If no, delete pharmacy and type the correct one.  This is the patient's preferred pharmacy:   [facility] - [place], [place] - [address] [place] [address] Phone: [phone] Fax: [phone]  Has the prescription been filled recently? No  Is the patient out of the medication? Yes  Has the patient been seen for an appointment in the last year OR does the patient have an upcoming appointment?   Can we respond through MyChart? Yes  Agent: Please be advised that Rx refills may take up to 3 business days. We ask that you follow-up with your pharmacy.

## 2023-06-25 DIAGNOSIS — D649 Anemia, unspecified: Secondary | ICD-10-CM | POA: Diagnosis not present

## 2023-06-25 DIAGNOSIS — A048 Other specified bacterial intestinal infections: Secondary | ICD-10-CM | POA: Diagnosis not present

## 2023-06-25 DIAGNOSIS — K5720 Diverticulitis of large intestine with perforation and abscess without bleeding: Secondary | ICD-10-CM | POA: Diagnosis not present

## 2023-06-25 MED ORDER — TELMISARTAN 40 MG PO TABS: 40.0000 mg | ORAL_TABLET | Freq: Every day | ORAL | 3 refills | Status: DC

## 2023-06-25 NOTE — Telephone Encounter (Signed)
 Last Fill: Unknown, Historical Provider  Last OV: 06/11/23 Next OV: 03/08/24 AWV  Routing to provider for review/authorization.

## 2023-07-01 ENCOUNTER — Encounter: Payer: Self-pay | Admitting: Family Medicine

## 2023-07-04 ENCOUNTER — Telehealth: Payer: Self-pay | Admitting: *Deleted

## 2023-07-04 MED ORDER — AMLODIPINE BESYLATE 10 MG PO TABS: 10.0000 mg | ORAL_TABLET | Freq: Every day | ORAL | 3 refills | Status: DC

## 2023-07-04 NOTE — Telephone Encounter (Signed)
 Copied from CRM [id number]. Topic: Clinical - Medication Question >> Jul 04, 2023 10:58 AM Luane Rumps D wrote: Reason for CRM: Patient calling for update regarding MyChart messages about Amlodipine  from 4/28-4/30, has not yet gotten a response.

## 2023-07-04 NOTE — Telephone Encounter (Signed)
 Done

## 2023-07-04 NOTE — Telephone Encounter (Signed)
 Encounter resolved, pt notified

## 2023-07-16 NOTE — Progress Notes (Unsigned)
  Electrophysiology Office Note:   Date:  07/17/2023  ID:  JAVAUGHN GERREN, DOB 03-18-1960, MRN [id number]  Primary Cardiologist: Ahmad Alert, MD Electrophysiologist: Boyce Byes, MD      History of Present Illness:   [name] is a 63 y.o. male with h/o OSA, HTN, and AF seen today for routine electrophysiology follow-up s/p Ablation.  Since last being seen in our clinic the patient reports doing very well. Overall, he denies chest pain, palpitations, dyspnea, PND, orthopnea, nausea, vomiting, dizziness, syncope, edema, weight gain, or early satiety.  Off BB because he felt awful.  PCP adjusted BP meds  Review of systems complete and found to be negative unless listed in HPI.   EP Information / Studies Reviewed:    EKG is ordered today. Personal review as below.  EKG Interpretation Date/Time:  Wednesday Jul 17 2023 10:57:23 EDT Ventricular Rate:  90 PR Interval:  154 QRS Duration:  110 QT Interval:  346 QTC Calculation: 423 R Axis:   -31  Text Interpretation: Normal sinus rhythm Left axis deviation Moderate voltage criteria for LVH, may be normal variant ( R in aVL , Cornell product ) When compared with ECG of 17-May-2023 10:20, QRS axis Shifted left T wave inversion no longer evident in Inferior leads Confirmed by Pilar Bridge [phone]) on 07/17/2023 11:19:10 AM    Arrhythmia/Device History S/p PVI and posterior wall ablation 04/2023   Physical Exam:   VS:  BP (!) 140/66   Pulse 90   Ht 5\' 10"  (1.778 m)   Wt 246 lb 8 oz (111.8 kg)   SpO2 99%   BMI 35.37 kg/m    Wt Readings from Last 3 Encounters:  07/17/23 246 lb 8 oz (111.8 kg)  06/11/23 241 lb (109.3 kg)  05/31/23 243 lb 4.8 oz (110.4 kg)     GEN: No acute distress NECK: No JVD; No carotid bruits CARDIAC: Regular rate and rhythm, no murmurs, rubs, gallops RESPIRATORY:  Clear to auscultation without rales, wheezing or rhonchi  ABDOMEN: Soft, non-tender, non-distended EXTREMITIES:  No edema; No deformity    ASSESSMENT AND PLAN:    Persistent AF S/p PVI and posterior wall ablation 04/2023 [id number] is 1. OK to stop eliquis .   Obesity Body mass index is 35.37 kg/m.  Encouraged lifestyle modification   OSA  Encouraged nightly CPAP  HTN Per PCP Can try taking Telmisartan  at night prior to increasing.      Follow up with EP APP in 6 months  Signed, Tylene Galla, PA-C

## 2023-07-17 ENCOUNTER — Encounter: Payer: Self-pay | Admitting: Student

## 2023-07-17 ENCOUNTER — Ambulatory Visit: Payer: Medicare HMO | Attending: Student | Admitting: Student

## 2023-07-17 VITALS — BP 140/66 | HR 90 | Ht 70.0 in | Wt 246.5 lb

## 2023-07-17 DIAGNOSIS — G4733 Obstructive sleep apnea (adult) (pediatric): Secondary | ICD-10-CM

## 2023-07-17 DIAGNOSIS — I10 Essential (primary) hypertension: Secondary | ICD-10-CM

## 2023-07-17 DIAGNOSIS — I4819 Other persistent atrial fibrillation: Secondary | ICD-10-CM

## 2023-07-17 NOTE — Patient Instructions (Signed)
 Medication Instructions:  Stop eliquis  *If you need a refill on your cardiac medications before your next appointment, please call your pharmacy*  Lab Work: None ordered If you have labs (blood work) drawn today and your tests are completely normal, you will receive your results only by: MyChart Message (if you have MyChart) OR A paper copy in the mail If you have any lab test that is abnormal or we need to change your treatment, we will call you to review the results.  Follow-Up: At [facility], you and your health needs are our priority.  As part of our continuing mission to provide you with exceptional heart care, our providers are all part of one team.  This team includes your primary Cardiologist (physician) and Advanced Practice Providers or APPs (Physician Assistants and Nurse Practitioners) who all work together to provide you with the care you need, when you need it.  Your next appointment:   6 month(s)  Provider:   Bambi [name] "Michaelle Adolphus, PA-C

## 2023-07-23 ENCOUNTER — Encounter: Payer: Self-pay | Admitting: Family Medicine

## 2023-07-23 ENCOUNTER — Ambulatory Visit (INDEPENDENT_AMBULATORY_CARE_PROVIDER_SITE_OTHER): Admitting: Family Medicine

## 2023-07-23 VITALS — BP 149/73 | HR 82 | Temp 98.2°F | Wt 245.0 lb

## 2023-07-23 DIAGNOSIS — D509 Iron deficiency anemia, unspecified: Secondary | ICD-10-CM | POA: Diagnosis not present

## 2023-07-23 DIAGNOSIS — I10 Essential (primary) hypertension: Secondary | ICD-10-CM | POA: Diagnosis not present

## 2023-07-23 DIAGNOSIS — E785 Hyperlipidemia, unspecified: Secondary | ICD-10-CM | POA: Diagnosis not present

## 2023-07-23 LAB — CBC WITH DIFFERENTIAL/PLATELET
Basophils Absolute: 0 10*3/uL (ref 0.0–0.1)
Basophils Relative: 0.9 % (ref 0.0–3.0)
Eosinophils Absolute: 0.1 10*3/uL (ref 0.0–0.7)
Eosinophils Relative: 2.3 % (ref 0.0–5.0)
HCT: 48.5 % (ref 39.0–52.0)
Hemoglobin: 15.5 g/dL (ref 13.0–17.0)
Lymphocytes Relative: 16.3 % (ref 12.0–46.0)
Lymphs Abs: 0.9 10*3/uL (ref 0.7–4.0)
MCHC: 31.9 g/dL (ref 30.0–36.0)
MCV: 75.6 fl — ABNORMAL LOW (ref 78.0–100.0)
Monocytes Absolute: 0.6 10*3/uL (ref 0.1–1.0)
Monocytes Relative: 12 % (ref 3.0–12.0)
Neutro Abs: 3.6 10*3/uL (ref 1.4–7.7)
Neutrophils Relative %: 68.5 % (ref 43.0–77.0)
Platelets: 218 10*3/uL (ref 150.0–400.0)
RBC: 6.42 Mil/uL — ABNORMAL HIGH (ref 4.22–5.81)
RDW: 21.3 % — ABNORMAL HIGH (ref 11.5–15.5)
WBC: 5.3 10*3/uL (ref 4.0–10.5)

## 2023-07-23 MED ORDER — TELMISARTAN-HCTZ 40-12.5 MG PO TABS: 1.0000 | ORAL_TABLET | Freq: Every morning | ORAL | 3 refills | Status: DC

## 2023-07-23 MED ORDER — AMLODIPINE BESYLATE 5 MG PO TABS: 5.0000 mg | ORAL_TABLET | Freq: Every day | ORAL | 3 refills | Status: DC

## 2023-07-23 NOTE — Progress Notes (Signed)
   Subjective:    Patient ID: [name], male    DOB: 1960-11-30, 63 y.o.   MRN: [id number]  HPI Here to follow up on BP. At our last visit we increased the Amlodipine  to 10 mg daily along with the Telmisartan . After a week his ankles began to swell. No SOB. He then went back to taking 5 mg of Amlodipine , and the swelling improved. However his systolic BP has crept up to the 140's or 150's. He feels fine.    Review of Systems  Constitutional: Negative.   Respiratory: Negative.    Cardiovascular:  Positive for leg swelling. Negative for chest pain and palpitations.       Objective:   Physical Exam Constitutional:      Appearance: Normal appearance.  Cardiovascular:     Rate and Rhythm: Normal rate and regular rhythm.     Pulses: Normal pulses.     Heart sounds: Normal heart sounds.  Pulmonary:     Effort: Pulmonary effort is normal.     Breath sounds: Normal breath sounds.  Musculoskeletal:     Right lower leg: No edema.     Left lower leg: No edema.  Neurological:     Mental Status: He is alert.           Assessment & Plan:  Hr had ankle edema from the 10 mg Amlodipine  so we will keep him at the 5 mg dose. We will also change from Telmisartan  to Telmisartan  HCT 40-12.5 daily. He will monitor the BP at home and report back in one week.  Corita Diego, MD

## 2023-07-24 LAB — IBC + FERRITIN
Ferritin: 20.3 ng/mL — ABNORMAL LOW (ref 22.0–322.0)
Iron: 31 ug/dL — ABNORMAL LOW (ref 42–165)
Saturation Ratios: 6.1 % — ABNORMAL LOW (ref 20.0–50.0)
TIBC: 508.2 ug/dL — ABNORMAL HIGH (ref 250.0–450.0)
Transferrin: 363 mg/dL — ABNORMAL HIGH (ref 212.0–360.0)

## 2023-07-27 LAB — LIPOPROTEIN A (LPA): Lipoprotein (a): 12 nmol/L (ref <75)

## 2023-07-30 ENCOUNTER — Ambulatory Visit: Payer: Self-pay | Admitting: Family Medicine

## 2023-08-06 ENCOUNTER — Telehealth: Payer: Self-pay

## 2023-08-06 ENCOUNTER — Encounter: Payer: Self-pay | Admitting: Family Medicine

## 2023-08-06 NOTE — Telephone Encounter (Signed)
 Advised pt of Dr Alyne Babinski recommendation, pt verbalized understanding

## 2023-08-06 NOTE — Telephone Encounter (Signed)
 Copied from CRM [id number]. Topic: Clinical - Medication Question >> Aug 06, 2023 10:54 AM [place] wrote: Reason for CRM: Patient calling to report blood pressure readings from 2 weeks ago. Wanting to know if he can add on a 20 mg of losartin at night time. His reading has not come down from top number 147, please reach out to patient soon.

## 2023-08-06 NOTE — Telephone Encounter (Signed)
 I would not add Losartan  because that is in the same family as Telmisartan . Instead tell him to increase the Telmisartan  HCT to 2 pills a day (either together or one BID) and report back in one week

## 2023-08-08 ENCOUNTER — Other Ambulatory Visit: Payer: Self-pay

## 2023-08-08 MED ORDER — TELMISARTAN-HCTZ 40-12.5 MG PO TABS: 1.0000 | ORAL_TABLET | Freq: Every morning | ORAL | 1 refills | Status: DC

## 2023-08-12 MED ORDER — TELMISARTAN-HCTZ 80-25 MG PO TABS: 1.0000 | ORAL_TABLET | Freq: Every day | ORAL | 3 refills | Status: DC

## 2023-08-12 NOTE — Telephone Encounter (Signed)
 I sent in the 80-25 dose

## 2023-08-13 ENCOUNTER — Encounter: Payer: Self-pay | Admitting: Family Medicine

## 2023-08-13 ENCOUNTER — Ambulatory Visit (INDEPENDENT_AMBULATORY_CARE_PROVIDER_SITE_OTHER): Admitting: Family Medicine

## 2023-08-13 VITALS — BP 120/70 | HR 86 | Temp 98.1°F | Wt 244.0 lb

## 2023-08-13 DIAGNOSIS — I10 Essential (primary) hypertension: Secondary | ICD-10-CM | POA: Diagnosis not present

## 2023-08-13 MED ORDER — TELMISARTAN 80 MG PO TABS: 80.0000 mg | ORAL_TABLET | Freq: Every day | ORAL | 5 refills | Status: DC

## 2023-08-13 NOTE — Progress Notes (Signed)
   Subjective:    Patient ID: [name], male    DOB: 1961-02-12, 63 y.o.   MRN: [id number]  HPI Here with questions about his BP medications. He wants to stop the Amlodipine  because it makes his ankles swell. He is convinced that hydrochlorothiazide  causes erection problems as well. His BP at home has been stable.    Review of Systems  Constitutional: Negative.   Respiratory: Negative.    Cardiovascular: Negative.        Objective:   Physical Exam Constitutional:      Appearance: Normal appearance.  Cardiovascular:     Rate and Rhythm: Normal rate and regular rhythm.     Pulses: Normal pulses.     Heart sounds: Normal heart sounds.  Pulmonary:     Effort: Pulmonary effort is normal.     Breath sounds: Normal breath sounds.  Neurological:     Mental Status: He is alert.           Assessment & Plan:  HTN. We will change Temisartan hydrochlorothiazide  to Telmisartan  80 mg to take daily. Stop the Amlodipine . He will report back in 2 weeks. Corita Diego, MD

## 2023-09-02 ENCOUNTER — Encounter: Payer: Self-pay | Admitting: Family Medicine

## 2023-09-02 ENCOUNTER — Ambulatory Visit (INDEPENDENT_AMBULATORY_CARE_PROVIDER_SITE_OTHER): Admitting: Family Medicine

## 2023-09-02 VITALS — BP 110/72 | HR 85 | Temp 98.2°F | Wt 247.0 lb

## 2023-09-02 DIAGNOSIS — I10 Essential (primary) hypertension: Secondary | ICD-10-CM | POA: Diagnosis not present

## 2023-09-02 NOTE — Progress Notes (Signed)
   Subjective:    Patient ID: [name], male    DOB: [birth year], 63 y.o.   MRN: [id number]  HPI Here to follow up on BP. He is taking Telmisartan  80 mg daily and he feels fine. He recently purchased a new BP cuff, and he has been getting readings at home in the 140's and 150's systolic. He brought the cuff with him and when he put it on we saw immediately that the cuff is too small. We checked his BP here with an extral alrge cuff, and his results were normal.    Review of Systems  Constitutional: Negative.   Respiratory: Negative.    Cardiovascular: Negative.        Objective:   Physical Exam Constitutional:      Appearance: Normal appearance.   Cardiovascular:     Rate and Rhythm: Normal rate and regular rhythm.     Pulses: Normal pulses.     Heart sounds: Normal heart sounds.  Pulmonary:     Effort: Pulmonary effort is normal.     Breath sounds: Normal breath sounds.   Neurological:     Mental Status: He is alert.           Assessment & Plan:  His HTN is now under good control. He will order an extra large cuff for his machine. Garnette Olmsted, MD

## 2023-09-24 ENCOUNTER — Other Ambulatory Visit: Payer: Self-pay | Admitting: Family Medicine

## 2023-11-12 ENCOUNTER — Encounter: Payer: Self-pay | Admitting: Family Medicine

## 2023-11-12 ENCOUNTER — Ambulatory Visit (INDEPENDENT_AMBULATORY_CARE_PROVIDER_SITE_OTHER): Admitting: Family Medicine

## 2023-11-12 VITALS — BP 130/80 | HR 78 | Temp 98.3°F | Wt 241.0 lb

## 2023-11-12 DIAGNOSIS — R42 Dizziness and giddiness: Secondary | ICD-10-CM

## 2023-11-12 NOTE — Progress Notes (Signed)
   Subjective:    Patient ID: [name], male    DOB: May 20, 1960, 63 y.o.   MRN: [id number]  HPI Here for several symptoms he has been experiencing for the past 2 weeks. He has had 3 spells where he suddenly felt very weak and dizzy, and he felt like the room was moving. These spells were brief, lasting about 15 seconds each. No sweats or SOB or chest pain. He had slight nausea with the spells which quickly went away. Each time he was standing up. Once was in his kitchen preparing food and another was while he was standing in line to check out at a grocery store. He also describes a few spells of feeling his pulse on the left side of his head and of right eye twitching, but these have stopped. His BP has been stable in the range of 130's over 70's. His is concerned that these may have been brief spells of atrial fibrillation.    Review of Systems  Constitutional: Negative.   Respiratory: Negative.    Cardiovascular: Negative.   Gastrointestinal: Negative.   Genitourinary: Negative.   Neurological:  Positive for dizziness. Negative for seizures, syncope, speech difficulty, weakness, light-headedness, numbness and headaches.       Objective:   Physical Exam Constitutional:      Appearance: Normal appearance.  Cardiovascular:     Rate and Rhythm: Normal rate and regular rhythm.     Pulses: Normal pulses.     Heart sounds: Normal heart sounds.  Pulmonary:     Effort: Pulmonary effort is normal.     Breath sounds: Normal breath sounds.  Neurological:     Mental Status: He is alert.           Assessment & Plan:  He has had several spells of what sounds ike vertigo, although orthostasis is possible. Less likely would be atrial fibrillation. I suggested he take Claritin and Mucinex daily for the next 2 weeks, as well as use Flonase sprays daily. Hopefully the spells will resolve. I also suggested he buy a smart watch to wear that can detect atrial fibrillation. Recheck as needed.   Garnette Olmsted, MD

## 2023-11-15 ENCOUNTER — Ambulatory Visit (INDEPENDENT_AMBULATORY_CARE_PROVIDER_SITE_OTHER): Admitting: Family Medicine

## 2023-11-15 ENCOUNTER — Encounter: Payer: Self-pay | Admitting: Family Medicine

## 2023-11-15 VITALS — BP 128/76 | HR 57 | Temp 98.3°F

## 2023-11-15 DIAGNOSIS — Z9889 Other specified postprocedural states: Secondary | ICD-10-CM

## 2023-11-15 NOTE — Progress Notes (Signed)
   Subjective:    Patient ID: [name], male    DOB: 1961-02-04, 63 y.o.   MRN: [id number]  HPI Here to fill out some disability forms relating to his low back pain. In 2016 he had a discectomy and fusion surgery at [facility] per Dr. Alvan, and he has been disabled since then. He had continued to follow up with Dr. Alvan until his retirement earlier this year. He is now asking us  to assume this role.    Review of Systems  Constitutional: Negative.   Respiratory: Negative.    Cardiovascular: Negative.   Musculoskeletal:  Positive for back pain.       Objective:   Physical Exam Constitutional:      Appearance: Normal appearance.  Cardiovascular:     Rate and Rhythm: Normal rate and regular rhythm.     Pulses: Normal pulses.     Heart sounds: Normal heart sounds.  Pulmonary:     Effort: Pulmonary effort is normal.     Breath sounds: Normal breath sounds.  Neurological:     Mental Status: He is alert.           Assessment & Plan:  Chronic low back pain S/P lumbar spine surgery. He is doing well with day to day activities, but he is still totally disabled from any employment. We filled out the forms together. Garnette Olmsted, MD

## 2023-11-18 ENCOUNTER — Telehealth (INDEPENDENT_AMBULATORY_CARE_PROVIDER_SITE_OTHER): Admitting: Family Medicine

## 2023-11-18 ENCOUNTER — Encounter: Payer: Self-pay | Admitting: Family Medicine

## 2023-11-18 VITALS — BP 143/87 | HR 75 | Wt 241.0 lb

## 2023-11-18 DIAGNOSIS — J019 Acute sinusitis, unspecified: Secondary | ICD-10-CM

## 2023-11-18 MED ORDER — AZITHROMYCIN 250 MG PO TABS: ORAL_TABLET | ORAL | 0 refills | Status: DC

## 2023-11-18 NOTE — Progress Notes (Signed)
 Subjective:    Patient ID: [name], male    DOB: 06-21-1960, 63 y.o.   MRN: [id number]  HPI Virtual Visit via Video Note  I connected with the patient on 11/18/23 at 11:00 AM EDT by a video enabled telemedicine application and verified that I am speaking with the correct person using two identifiers.  Location patient: home Location provider:work or home office Persons participating in the virtual visit: patient, provider  I discussed the limitations of evaluation and management by telemedicine and the availability of in person appointments. The patient expressed understanding and agreed to proceed.   HPI: Here for 4 days of sinus congestion, PND, and coughing up green sputum. No fever or body aches or SOB. Using Mucinex and Coricidin.    ROS: See pertinent positives and negatives per HPI.  Past Medical History:  Diagnosis Date   Arthritis    Cancer [facility])    prostate, sees Dr. Alline    Colon polyps    Degenerative disc disease, cervical    Degenerative disc disease, lumbar    Depression    GERD (gastroesophageal reflux disease)    Low back pain    Osteopenia     Past Surgical History:  Procedure Laterality Date   ATRIAL FIBRILLATION ABLATION N/A 04/19/2023   Procedure: ATRIAL FIBRILLATION ABLATION;  Surgeon: Cindie Ole DASEN, MD;  Location: [facility];  Service: Cardiovascular;  Laterality: N/A;   CERVICAL FUSION  2015   C3 through C7, per Dr. Alvan    COLONOSCOPY  10/18/2022   per Dr. Elicia, clear, repeat in 5 yrs (has a hx of adenomatous polyps)   CYSTECTOMY     benign, right hip   ESOPHAGOGASTRODUODENOSCOPY  10/18/2022   per Dr. Elicia, showed H pylori and Candida   GYNECOMASTIA EXCISION     bilateral, reduction   HIP SURGERY Right    12/2020   JOINT REPLACEMENT  12/20/2011   hx. LTHA, now RTHA planned   LAMINECTOMY  04/2007   lumbar   LUMBAR FUSION  2015   L4 through S1, per Dr. Alvan    LUMBAR MICRODISCECTOMY  10/15/2008    L-4-5, per Dr. Carlin Alvan at [facility]   PROSTATE SURGERY  12/13/2010   robotic prostatectomy per Dr. Alline    TOTAL HIP ARTHROPLASTY  12/20/2011   2010 left hip   TOTAL HIP ARTHROPLASTY  12/26/2011   Procedure: TOTAL HIP ARTHROPLASTY;  Surgeon: Dempsey LULLA Moan, MD;  Location: [facility];  Service: Orthopedics;  Laterality: Right;    Family History  Problem Relation Age of Onset   Hypertension Father    Suicidality Father    Healthy Mother    Arthritis Other    Cancer Brother        Bone cancer     Current Outpatient Medications:    anastrozole  (ARIMIDEX ) 1 MG tablet, TAKE 1 TABLET BY MOUTH EVERY DAY, Disp: 90 tablet, Rfl: 3   Cholecalciferol (VITAMIN D) 125 MCG (5000 UT) CAPS, Take 5,000 Units by mouth at bedtime. With K2, Disp: , Rfl:    diclofenac  (VOLTAREN ) 75 MG EC tablet, Take 75 mg by mouth 2 (two) times daily., Disp: , Rfl:    MAGNESIUM GLYCINATE PO, Take 1,000 mg by mouth at bedtime., Disp: , Rfl:    telmisartan  (MICARDIS ) 80 MG tablet, Take 1 tablet (80 mg total) by mouth daily., Disp: 30 tablet, Rfl: 5   testosterone  cypionate (DEPOTESTOSTERONE CYPIONATE) 200 MG/ML injection, Inject 1 mL (200 mg total)  into the muscle every 7 (seven) days., Disp: 10 mL, Rfl: 5   traMADol  (ULTRAM ) 50 MG tablet, TAKE 2 TABLETS EVERY 6 HOURS AS NEEDED FOR PAIN, Disp: 240 tablet, Rfl: 2  EXAM:  VITALS per patient if applicable:  GENERAL: alert, oriented, appears well and in no acute distress  HEENT: atraumatic, conjunttiva clear, no obvious abnormalities on inspection of external nose and ears  NECK: normal movements of the head and neck  LUNGS: on inspection no signs of respiratory distress, breathing rate appears normal, no obvious gross SOB, gasping or wheezing  CV: no obvious cyanosis  MS: moves all visible extremities without noticeable abnormality  PSYCH/NEURO: pleasant and cooperative, no obvious depression or anxiety, speech and thought processing grossly  intact  ASSESSMENT AND PLAN: Sinusitis, treat with a Zpack. Recheck as needed. Garnette Olmsted, MD  Discussed the following assessment and plan:  No diagnosis found.     I discussed the assessment and treatment plan with the patient. The patient was provided an opportunity to ask questions and all were answered. The patient agreed with the plan and demonstrated an understanding of the instructions.   The patient was advised to call back or seek an in-person evaluation if the symptoms worsen or if the condition fails to improve as anticipated.      Review of Systems     Objective:   Physical Exam        Assessment & Plan:

## 2023-12-02 ENCOUNTER — Other Ambulatory Visit: Payer: Self-pay | Admitting: Family Medicine

## 2023-12-19 ENCOUNTER — Encounter: Payer: Self-pay | Admitting: Family Medicine

## 2023-12-19 ENCOUNTER — Ambulatory Visit: Admitting: Family Medicine

## 2023-12-19 VITALS — BP 124/78 | HR 88 | Temp 98.6°F | Wt 241.0 lb

## 2023-12-19 DIAGNOSIS — M150 Primary generalized (osteo)arthritis: Secondary | ICD-10-CM

## 2023-12-19 MED ORDER — PREDNISONE 10 MG PO TABS: 10.0000 mg | ORAL_TABLET | Freq: Every day | ORAL | 2 refills | Status: DC

## 2023-12-19 NOTE — Progress Notes (Signed)
   Subjective:    Patient ID: [name], male    DOB: 1960/08/23, 63 y.o.   MRN: [id number]  HPI Here to discuss his chronic pain. Due to his age and his hx of multiple psinal surgeries, he has pain and stiffness every day. He takes Diclofenac  daily and then Tramadol  as needed, but he asks if he could take something to reduce the inflammation in his body.    Review of Systems  Constitutional: Negative.   Respiratory: Negative.    Cardiovascular: Negative.   Musculoskeletal:  Positive for arthralgias and back pain.       Objective:   Physical Exam Constitutional:      Appearance: Normal appearance.  Cardiovascular:     Rate and Rhythm: Normal rate and regular rhythm.     Pulses: Normal pulses.     Heart sounds: Normal heart sounds.  Pulmonary:     Effort: Pulmonary effort is normal.     Breath sounds: Normal breath sounds.  Neurological:     Mental Status: He is alert.           Assessment & Plan:  OA. He will try taking Prednisone  10 mg daily, then report back in 2-3 weeks.  Garnette Olmsted, MD

## 2023-12-30 ENCOUNTER — Telehealth: Payer: Self-pay

## 2023-12-30 ENCOUNTER — Other Ambulatory Visit (HOSPITAL_COMMUNITY): Payer: Self-pay

## 2023-12-30 ENCOUNTER — Encounter: Payer: Self-pay | Admitting: Family Medicine

## 2023-12-30 MED ORDER — TESTOSTERONE CYPIONATE 200 MG/ML IM SOLN: 200.0000 mg | INTRAMUSCULAR | 5 refills | Status: DC

## 2023-12-30 NOTE — Telephone Encounter (Signed)
 Pharmacy Patient Advocate Encounter   Received notification from Onbase that prior authorization for Testosterone  Cyp 200 is required/requested.    Unable to verify patients insurance. I have run eligibility checks and used the [facility] information in patient media. The patient is insured through [employer].   CMM has sent this message:

## 2023-12-30 NOTE — Telephone Encounter (Signed)
 Done

## 2024-02-05 ENCOUNTER — Other Ambulatory Visit: Payer: Self-pay | Admitting: Family Medicine

## 2024-02-11 ENCOUNTER — Other Ambulatory Visit: Payer: Self-pay | Admitting: Family Medicine

## 2024-02-13 ENCOUNTER — Encounter: Payer: Self-pay | Admitting: Family Medicine

## 2024-02-13 ENCOUNTER — Telehealth (INDEPENDENT_AMBULATORY_CARE_PROVIDER_SITE_OTHER): Admitting: Family Medicine

## 2024-02-13 DIAGNOSIS — J019 Acute sinusitis, unspecified: Secondary | ICD-10-CM | POA: Diagnosis not present

## 2024-02-13 MED ORDER — AZITHROMYCIN 250 MG PO TABS: ORAL_TABLET | ORAL | 0 refills | Status: DC

## 2024-02-13 NOTE — Progress Notes (Signed)
 Subjective:    Patient ID: [name], male    DOB: [birth year], 63 y.o.   MRN: [id number]  HPI Virtual Visit via Video Note  I connected with the patient on 02/13/2024 at  9:00 AM EST by a video enabled telemedicine application and verified that I am speaking with the correct person using two identifiers.  Location patient: home Location provider:work or home office Persons participating in the virtual visit: patient, provider  I discussed the limitations of evaluation and management by telemedicine and the availability of in person appointments. The patient expressed understanding and agreed to proceed.   HPI: Here for symptoms that began 8 days ago. He started with a stuffy head and ST, then he developed a dry cough. No fever. Now he is blowing green mucus from the nose.    ROS: See pertinent positives and negatives per HPI.  Past Medical History:  Diagnosis Date   Arthritis    Cancer [facility])    prostate, sees Dr. Alline    Colon polyps    Degenerative disc disease, cervical    Degenerative disc disease, lumbar    Depression    GERD (gastroesophageal reflux disease)    Low back pain    Osteopenia     Past Surgical History:  Procedure Laterality Date   ATRIAL FIBRILLATION ABLATION N/A 04/19/2023   Procedure: ATRIAL FIBRILLATION ABLATION;  Surgeon: Cindie Ole DASEN, MD;  Location: [facility];  Service: Cardiovascular;  Laterality: N/A;   CERVICAL FUSION  2015   C3 through C7, per Dr. Alvan    COLONOSCOPY  10/18/2022   per Dr. Elicia, clear, repeat in 5 yrs (has a hx of adenomatous polyps)   CYSTECTOMY     benign, right hip   ESOPHAGOGASTRODUODENOSCOPY  10/18/2022   per Dr. Elicia, showed H pylori and Candida   GYNECOMASTIA EXCISION     bilateral, reduction   HIP SURGERY Right    12/2020   JOINT REPLACEMENT  12/20/2011   hx. LTHA, now RTHA planned   LAMINECTOMY  04/2007   lumbar   LUMBAR FUSION  2015   L4 through S1, per Dr. Alvan     LUMBAR MICRODISCECTOMY  10/15/2008   L-4-5, per Dr. Carlin Alvan at [facility]   PROSTATE SURGERY  12/13/2010   robotic prostatectomy per Dr. Alline    TOTAL HIP ARTHROPLASTY  12/20/2011   2010 left hip   TOTAL HIP ARTHROPLASTY  12/26/2011   Procedure: TOTAL HIP ARTHROPLASTY;  Surgeon: Dempsey LULLA Moan, MD;  Location: [facility];  Service: Orthopedics;  Laterality: Right;    Family History  Problem Relation Age of Onset   Hypertension Father    Suicidality Father    Healthy Mother    Arthritis Other    Cancer Brother        Bone cancer    Current Medications[1]  EXAM:  VITALS per patient if applicable:  GENERAL: alert, oriented, appears well and in no acute distress  HEENT: atraumatic, conjunttiva clear, no obvious abnormalities on inspection of external nose and ears  NECK: normal movements of the head and neck  LUNGS: on inspection no signs of respiratory distress, breathing rate appears normal, no obvious gross SOB, gasping or wheezing  CV: no obvious cyanosis  MS: moves all visible extremities without noticeable abnormality  PSYCH/NEURO: pleasant and cooperative, no obvious depression or anxiety, speech and thought processing grossly intact  ASSESSMENT AND PLAN: Sinusitis, treat with a Zpack. Garnette Olmsted, MD  Discussed the  following assessment and plan:  No diagnosis found.     I discussed the assessment and treatment plan with the patient. The patient was provided an opportunity to ask questions and all were answered. The patient agreed with the plan and demonstrated an understanding of the instructions.   The patient was advised to call back or seek an in-person evaluation if the symptoms worsen or if the condition fails to improve as anticipated.      Review of Systems     Objective:   Physical Exam        Assessment & Plan:       [1]  Current Outpatient Medications:    anastrozole  (ARIMIDEX ) 1 MG tablet, TAKE 1 TABLET BY MOUTH  EVERY DAY, Disp: 90 tablet, Rfl: 3   Cholecalciferol (VITAMIN D) 125 MCG (5000 UT) CAPS, Take 5,000 Units by mouth at bedtime. With K2, Disp: , Rfl:    diclofenac  (VOLTAREN ) 75 MG EC tablet, TAKE 1 TABLET TWICE DAILY, Disp: 180 tablet, Rfl: 3   MAGNESIUM GLYCINATE PO, Take 1,000 mg by mouth at bedtime., Disp: , Rfl:    predniSONE  (DELTASONE ) 10 MG tablet, Take 1 tablet (10 mg total) by mouth daily with breakfast., Disp: 30 tablet, Rfl: 2   telmisartan  (MICARDIS ) 80 MG tablet, TAKE 1 TABLET BY MOUTH EVERY DAY, Disp: 90 tablet, Rfl: 1   testosterone  cypionate (DEPOTESTOSTERONE CYPIONATE) 200 MG/ML injection, Inject 1 mL (200 mg total) into the muscle every 7 (seven) days., Disp: 10 mL, Rfl: 5   traMADol  (ULTRAM ) 50 MG tablet, TAKE 2 TABLETS EVERY 6 HOURS AS NEEDED FOR PAIN, Disp: 240 tablet, Rfl: 2

## 2024-02-16 ENCOUNTER — Other Ambulatory Visit: Payer: Self-pay | Admitting: Family Medicine

## 2024-02-18 ENCOUNTER — Telehealth: Payer: Self-pay

## 2024-02-18 ENCOUNTER — Other Ambulatory Visit: Payer: Self-pay | Admitting: Family Medicine

## 2024-02-18 ENCOUNTER — Other Ambulatory Visit (HOSPITAL_COMMUNITY): Payer: Self-pay

## 2024-02-18 NOTE — Telephone Encounter (Signed)
 Pharmacy Patient Advocate Encounter   Received notification from Onbase that prior authorization for Testosterone  Cypionate 200MG /ML intramuscular solution  is required/requested.   Insurance verification completed.   The patient is insured through [employer].   Per test claim: PA required; PA submitted to above mentioned insurance via Latent Key/confirmation #/[id number] [facility] Status is pending

## 2024-02-19 NOTE — Telephone Encounter (Signed)
 Pharmacy Patient Advocate Encounter  Received notification from [employer] that Prior Authorization for Testosterone  Cypionate 200MG /ML intramuscular solution  has been APPROVED from 03/06/2023 to 03/04/2025   PA #/Case ID/Reference #: [id number]

## 2024-02-20 NOTE — Telephone Encounter (Signed)
 Please talk to him about this

## 2024-03-10 ENCOUNTER — Other Ambulatory Visit: Payer: Self-pay | Admitting: Family Medicine

## 2024-03-26 ENCOUNTER — Encounter: Payer: Self-pay | Admitting: Family Medicine

## 2024-03-26 ENCOUNTER — Ambulatory Visit: Admitting: Family Medicine

## 2024-03-26 VITALS — BP 124/76 | HR 82 | Temp 98.1°F | Ht 69.75 in | Wt 236.0 lb

## 2024-03-26 DIAGNOSIS — M810 Age-related osteoporosis without current pathological fracture: Secondary | ICD-10-CM

## 2024-03-26 DIAGNOSIS — E291 Testicular hypofunction: Secondary | ICD-10-CM

## 2024-03-26 DIAGNOSIS — E785 Hyperlipidemia, unspecified: Secondary | ICD-10-CM | POA: Diagnosis not present

## 2024-03-26 DIAGNOSIS — R739 Hyperglycemia, unspecified: Secondary | ICD-10-CM | POA: Diagnosis not present

## 2024-03-26 DIAGNOSIS — Z8546 Personal history of malignant neoplasm of prostate: Secondary | ICD-10-CM | POA: Diagnosis not present

## 2024-03-26 DIAGNOSIS — I10 Essential (primary) hypertension: Secondary | ICD-10-CM

## 2024-03-26 DIAGNOSIS — E538 Deficiency of other specified B group vitamins: Secondary | ICD-10-CM

## 2024-03-26 DIAGNOSIS — C61 Malignant neoplasm of prostate: Secondary | ICD-10-CM

## 2024-03-26 DIAGNOSIS — D509 Iron deficiency anemia, unspecified: Secondary | ICD-10-CM | POA: Diagnosis not present

## 2024-03-26 DIAGNOSIS — E559 Vitamin D deficiency, unspecified: Secondary | ICD-10-CM

## 2024-03-26 LAB — IBC + FERRITIN
Ferritin: 31.1 ng/mL (ref 22.0–322.0)
Iron: 239 ug/dL — ABNORMAL HIGH (ref 42–165)
Saturation Ratios: 53.9 % — ABNORMAL HIGH (ref 20.0–50.0)
TIBC: 443.8 ug/dL (ref 250.0–450.0)
Transferrin: 317 mg/dL (ref 212.0–360.0)

## 2024-03-26 NOTE — Progress Notes (Signed)
 "  Subjective:    Patient ID: [name], male    DOB: 20-Feb-1961, 64 y.o.   MRN: [id number]  HPI Here to follow up on issues. He feels well and has no concerns. He still works out at [employer] almost every day and he eats a healthy high protein diet. His BP is stable. His last DEXA in 2009 was normal.    Review of Systems  Constitutional: Negative.   HENT: Negative.    Eyes: Negative.   Respiratory: Negative.    Cardiovascular: Negative.   Gastrointestinal: Negative.   Genitourinary: Negative.   Musculoskeletal: Negative.   Skin: Negative.   Neurological: Negative.   Psychiatric/Behavioral: Negative.         Objective:   Physical Exam Constitutional:      General: He is not in acute distress.    Appearance: Normal appearance. He is well-developed. He is not diaphoretic.  HENT:     Head: Normocephalic and atraumatic.     Right Ear: External ear normal.     Left Ear: External ear normal.     Nose: Nose normal.     Mouth/Throat:     Pharynx: No oropharyngeal exudate.  Eyes:     General: No scleral icterus.       Right eye: No discharge.        Left eye: No discharge.     Conjunctiva/sclera: Conjunctivae normal.     Pupils: Pupils are equal, round, and reactive to light.  Neck:     Thyroid : No thyromegaly.     Vascular: No JVD.     Trachea: No tracheal deviation.  Cardiovascular:     Rate and Rhythm: Normal rate and regular rhythm.     Pulses: Normal pulses.     Heart sounds: Normal heart sounds. No murmur heard.    No friction rub. No gallop.  Pulmonary:     Effort: Pulmonary effort is normal. No respiratory distress.     Breath sounds: Normal breath sounds. No wheezing or rales.  Chest:     Chest wall: No tenderness.  Abdominal:     General: Bowel sounds are normal. There is no distension.     Palpations: Abdomen is soft. There is no mass.     Tenderness: There is no abdominal tenderness. There is no guarding or rebound.  Genitourinary:    Penis: Normal. No  tenderness.      Testes: Normal.  Musculoskeletal:        General: No tenderness. Normal range of motion.     Cervical back: Neck supple.  Lymphadenopathy:     Cervical: No cervical adenopathy.  Skin:    General: Skin is warm and dry.     Coloration: Skin is not pale.     Findings: No erythema or rash.  Neurological:     General: No focal deficit present.     Mental Status: He is alert and oriented to person, place, and time.     Cranial Nerves: No cranial nerve deficit.     Motor: No abnormal muscle tone.     Coordination: Coordination normal.     Deep Tendon Reflexes: Reflexes are normal and symmetric. Reflexes normal.  Psychiatric:        Mood and Affect: Mood normal.        Behavior: Behavior normal.        Thought Content: Thought content normal.        Judgment: Judgment normal.  Assessment & Plan:  He seems to be doing well. His HTN is well controlled. We will get labs to check lipids and lipoprotein A for dyslipidemia. Check testosterone  and estradiol  for hypogonadism. Check iron , ferritin, and CBC for iron  deficiency anemia. Check PSA for the hx of prostate cancer. Se tup another DEXA. I personally spent a total of 32 minutes in the care of the patient today including getting/reviewing separately obtained history, performing a medically appropriate exam/evaluation, counseling and educating, and placing orders.  Garnette Olmsted, MD    "

## 2024-03-27 ENCOUNTER — Ambulatory Visit: Payer: Self-pay | Admitting: Family Medicine

## 2024-03-31 ENCOUNTER — Ambulatory Visit: Admitting: Family Medicine

## 2024-03-31 LAB — HEPATIC FUNCTION PANEL
AG Ratio: 1.9 (calc) (ref 1.0–2.5)
ALT: 54 U/L — ABNORMAL HIGH (ref 9–46)
AST: 30 U/L (ref 10–35)
Albumin: 4.7 g/dL (ref 3.6–5.1)
Alkaline phosphatase (APISO): 80 U/L (ref 35–144)
Bilirubin, Direct: 0.2 mg/dL (ref 0.0–0.2)
Globulin: 2.5 g/dL (ref 1.9–3.7)
Indirect Bilirubin: 1.1 mg/dL (ref 0.2–1.2)
Total Bilirubin: 1.3 mg/dL — ABNORMAL HIGH (ref 0.2–1.2)
Total Protein: 7.2 g/dL (ref 6.1–8.1)

## 2024-03-31 LAB — ESTRADIOL: Estradiol: 42 pg/mL — ABNORMAL HIGH

## 2024-03-31 LAB — CBC WITH DIFFERENTIAL/PLATELET
Absolute Lymphocytes: 1134 {cells}/uL (ref 850–3900)
Absolute Monocytes: 633 {cells}/uL (ref 200–950)
Basophils Absolute: 51 {cells}/uL (ref 0–200)
Basophils Relative: 0.9 %
Eosinophils Absolute: 160 {cells}/uL (ref 15–500)
Eosinophils Relative: 2.8 %
HCT: 58.2 % — ABNORMAL HIGH (ref 39.4–51.1)
Hemoglobin: 20 g/dL — ABNORMAL HIGH (ref 13.2–17.1)
MCH: 30.4 pg (ref 27.0–33.0)
MCHC: 34.4 g/dL (ref 31.6–35.4)
MCV: 88.4 fL (ref 81.4–101.7)
MPV: 9.8 fL (ref 7.5–12.5)
Monocytes Relative: 11.1 %
Neutro Abs: 3722 {cells}/uL (ref 1500–7800)
Neutrophils Relative %: 65.3 %
Platelets: 213 10*3/uL (ref 140–400)
RBC: 6.58 Million/uL — ABNORMAL HIGH (ref 4.20–5.80)
RDW: 15.4 % — ABNORMAL HIGH (ref 11.0–15.0)
Total Lymphocyte: 19.9 %
WBC: 5.7 10*3/uL (ref 3.8–10.8)

## 2024-03-31 LAB — LIPID PANEL
Cholesterol: 322 mg/dL — ABNORMAL HIGH
HDL: 47 mg/dL
LDL Cholesterol (Calc): 250 mg/dL — ABNORMAL HIGH
Non-HDL Cholesterol (Calc): 275 mg/dL — ABNORMAL HIGH
Total CHOL/HDL Ratio: 6.9 (calc) — ABNORMAL HIGH
Triglycerides: 114 mg/dL

## 2024-03-31 LAB — BASIC METABOLIC PANEL WITH GFR
BUN: 20 mg/dL (ref 7–25)
CO2: 29 mmol/L (ref 20–32)
Calcium: 9.6 mg/dL (ref 8.6–10.3)
Chloride: 100 mmol/L (ref 98–110)
Creat: 0.97 mg/dL (ref 0.70–1.35)
Glucose, Bld: 98 mg/dL (ref 65–99)
Potassium: 4.3 mmol/L (ref 3.5–5.3)
Sodium: 138 mmol/L (ref 135–146)
eGFR: 89 mL/min/{1.73_m2}

## 2024-03-31 LAB — TSH: TSH: 2.16 m[IU]/L (ref 0.40–4.50)

## 2024-03-31 LAB — VITAMIN B12: Vitamin B-12: 621 pg/mL (ref 200–1100)

## 2024-03-31 LAB — HEMOGLOBIN A1C
Hgb A1c MFr Bld: 5.2 %
Mean Plasma Glucose: 103 mg/dL
eAG (mmol/L): 5.7 mmol/L

## 2024-03-31 LAB — VITAMIN D 25 HYDROXY (VIT D DEFICIENCY, FRACTURES): Vit D, 25-Hydroxy: 59 ng/mL (ref 30–100)

## 2024-03-31 LAB — LIPOPROTEIN A (LPA): Lipoprotein (a): 13 nmol/L

## 2024-03-31 LAB — PSA: PSA: 0.07 ng/mL

## 2024-03-31 LAB — TESTOSTERONE: Testosterone: 1267 ng/dL — ABNORMAL HIGH (ref 250–827)

## 2024-04-03 ENCOUNTER — Encounter: Payer: Self-pay | Admitting: Family Medicine

## 2024-04-03 ENCOUNTER — Telehealth: Payer: Self-pay | Admitting: Family Medicine

## 2024-04-03 ENCOUNTER — Ambulatory Visit: Admitting: Family Medicine

## 2024-04-03 VITALS — BP 124/76 | HR 90 | Temp 98.6°F | Wt 236.0 lb

## 2024-04-03 DIAGNOSIS — E785 Hyperlipidemia, unspecified: Secondary | ICD-10-CM

## 2024-04-03 DIAGNOSIS — E291 Testicular hypofunction: Secondary | ICD-10-CM | POA: Diagnosis not present

## 2024-04-03 DIAGNOSIS — D582 Other hemoglobinopathies: Secondary | ICD-10-CM | POA: Diagnosis not present

## 2024-04-03 MED ORDER — TESTOSTERONE CYPIONATE 100 MG/ML IM SOLN: 100.0000 mg | INTRAMUSCULAR | 5 refills | Status: AC

## 2024-04-03 NOTE — Telephone Encounter (Signed)
 Agent will let pt know we will call when ready for pick up

## 2024-04-03 NOTE — Progress Notes (Signed)
" ° °  Subjective:    Patient ID: [name], male    DOB: 01/14/1961, 64 y.o.   MRN: [id number]  HPI Here to follow up on lab results that we obtained on 03-26-24. These were notable for a testosterone  level of 1267, Hgb 20.0, and LDL 250. These all jumped up from his last lab draw, and we realize these are all effects of the testosterone  shots he has been taking. He feels fine, but he wants to decrease his dosing. He also knows he needs to donate blood periodically (which he has done in the past).    Review of Systems  Constitutional: Negative.   Respiratory: Negative.    Cardiovascular: Negative.        Objective:   Physical Exam Constitutional:      Appearance: Normal appearance.  Cardiovascular:     Rate and Rhythm: Normal rate and regular rhythm.     Pulses: Normal pulses.     Heart sounds: Normal heart sounds.  Pulmonary:     Effort: Pulmonary effort is normal.     Breath sounds: Normal breath sounds.  Neurological:     Mental Status: He is alert.           Assessment & Plan:  Hypogonadism. We will decrease his dosing of testosterone  cypionate to 100 mg weekly. He has decided to split this up to give himself 50 mg (or 0.5 ml) twice weekly to stabilize his blood levels. He will donate a pint of blood every 8 weeks. We will track the high Hgb level and the dyslipidemia closely, and he will return for another lab draw in 2 months.  Garnette Olmsted, MD   "

## 2024-04-03 NOTE — Telephone Encounter (Signed)
 Patient dropped off document [employer], to be filled out by provider. Patient requested to send it back via Call Patient to pick up within 7-days. Document is located in providers tray at front office.Please advise at [facility] [phone]

## 2024-04-03 NOTE — Telephone Encounter (Signed)
Pt form received and placed in Dr Fry red folder 

## 2024-05-18 ENCOUNTER — Ambulatory Visit
